# Patient Record
Sex: Female | Born: 1945 | Race: White | Hispanic: No | Marital: Married | Smoking: Never smoker
Health system: Southern US, Community
[De-identification: ages and names within clinical notes are randomized; demographics above are authoritative.]

## PROBLEM LIST (undated history)

## (undated) DIAGNOSIS — R7301 Impaired fasting glucose: Secondary | ICD-10-CM

## (undated) DIAGNOSIS — K589 Irritable bowel syndrome without diarrhea: Secondary | ICD-10-CM

## (undated) DIAGNOSIS — D332 Benign neoplasm of brain, unspecified: Secondary | ICD-10-CM

## (undated) DIAGNOSIS — M1990 Unspecified osteoarthritis, unspecified site: Secondary | ICD-10-CM

## (undated) DIAGNOSIS — K219 Gastro-esophageal reflux disease without esophagitis: Secondary | ICD-10-CM

## (undated) DIAGNOSIS — M8580 Other specified disorders of bone density and structure, unspecified site: Secondary | ICD-10-CM

## (undated) DIAGNOSIS — E039 Hypothyroidism, unspecified: Secondary | ICD-10-CM

## (undated) DIAGNOSIS — Z8551 Personal history of malignant neoplasm of bladder: Secondary | ICD-10-CM

## (undated) DIAGNOSIS — I872 Venous insufficiency (chronic) (peripheral): Secondary | ICD-10-CM

## (undated) DIAGNOSIS — R0789 Other chest pain: Secondary | ICD-10-CM

## (undated) DIAGNOSIS — I10 Essential (primary) hypertension: Secondary | ICD-10-CM

## (undated) DIAGNOSIS — Z8669 Personal history of other diseases of the nervous system and sense organs: Secondary | ICD-10-CM

## (undated) DIAGNOSIS — R7303 Prediabetes: Secondary | ICD-10-CM

## (undated) HISTORY — DX: Irritable bowel syndrome, unspecified: K58.9

## (undated) HISTORY — DX: Gastro-esophageal reflux disease without esophagitis: K21.9

## (undated) HISTORY — DX: Other specified disorders of bone density and structure, unspecified site: M85.80

## (undated) HISTORY — PX: CARPAL TUNNEL RELEASE: SHX101

## (undated) HISTORY — DX: Other chest pain: R07.89

## (undated) HISTORY — PX: ABDOMINAL HYSTERECTOMY: SHX81

## (undated) HISTORY — DX: Impaired fasting glucose: R73.01

## (undated) HISTORY — DX: Essential (primary) hypertension: I10

## (undated) HISTORY — DX: Benign neoplasm of brain, unspecified: D33.2

## (undated) HISTORY — PX: BREAST BIOPSY: SHX20

## (undated) HISTORY — DX: Venous insufficiency (chronic) (peripheral): I87.2

## (undated) HISTORY — PX: LUMBAR DISC SURGERY: SHX700

## (undated) HISTORY — PX: PARTIAL HYSTERECTOMY: SHX80

## (undated) HISTORY — PX: APPENDECTOMY: SHX54

---

## 2000-07-30 DIAGNOSIS — D332 Benign neoplasm of brain, unspecified: Secondary | ICD-10-CM

## 2000-07-30 HISTORY — DX: Benign neoplasm of brain, unspecified: D33.2

## 2000-11-18 ENCOUNTER — Encounter: Payer: Self-pay | Admitting: Family Medicine

## 2000-11-18 ENCOUNTER — Ambulatory Visit (HOSPITAL_COMMUNITY): Admission: RE | Admit: 2000-11-18 | Discharge: 2000-11-18 | Payer: Self-pay | Admitting: Family Medicine

## 2002-01-23 ENCOUNTER — Ambulatory Visit (HOSPITAL_COMMUNITY): Admission: RE | Admit: 2002-01-23 | Discharge: 2002-01-23 | Payer: Self-pay | Admitting: Family Medicine

## 2002-01-23 ENCOUNTER — Encounter: Payer: Self-pay | Admitting: Family Medicine

## 2002-03-13 ENCOUNTER — Ambulatory Visit (HOSPITAL_COMMUNITY): Admission: RE | Admit: 2002-03-13 | Discharge: 2002-03-13 | Payer: Self-pay | Admitting: Internal Medicine

## 2003-03-30 ENCOUNTER — Ambulatory Visit (HOSPITAL_COMMUNITY): Admission: RE | Admit: 2003-03-30 | Discharge: 2003-03-30 | Payer: Self-pay | Admitting: Obstetrics and Gynecology

## 2003-03-30 ENCOUNTER — Encounter: Payer: Self-pay | Admitting: Family Medicine

## 2003-04-06 ENCOUNTER — Ambulatory Visit (HOSPITAL_COMMUNITY): Admission: RE | Admit: 2003-04-06 | Discharge: 2003-04-06 | Payer: Self-pay | Admitting: Family Medicine

## 2003-04-06 ENCOUNTER — Encounter: Payer: Self-pay | Admitting: Family Medicine

## 2003-11-18 ENCOUNTER — Ambulatory Visit (HOSPITAL_COMMUNITY): Admission: RE | Admit: 2003-11-18 | Discharge: 2003-11-18 | Payer: Self-pay | Admitting: Family Medicine

## 2004-03-14 ENCOUNTER — Ambulatory Visit (HOSPITAL_COMMUNITY): Admission: RE | Admit: 2004-03-14 | Discharge: 2004-03-14 | Payer: Self-pay | Admitting: Family Medicine

## 2004-08-05 ENCOUNTER — Ambulatory Visit (HOSPITAL_COMMUNITY): Admission: RE | Admit: 2004-08-05 | Discharge: 2004-08-05 | Payer: Self-pay | Admitting: Family Medicine

## 2004-08-16 ENCOUNTER — Ambulatory Visit (HOSPITAL_COMMUNITY): Admission: RE | Admit: 2004-08-16 | Discharge: 2004-08-17 | Payer: Self-pay | Admitting: Neurosurgery

## 2004-11-10 ENCOUNTER — Ambulatory Visit (HOSPITAL_COMMUNITY): Admission: RE | Admit: 2004-11-10 | Discharge: 2004-11-10 | Payer: Self-pay | Admitting: Neurology

## 2004-12-18 ENCOUNTER — Ambulatory Visit (HOSPITAL_COMMUNITY): Admission: RE | Admit: 2004-12-18 | Discharge: 2004-12-18 | Payer: Self-pay | Admitting: Family Medicine

## 2005-05-08 ENCOUNTER — Ambulatory Visit (HOSPITAL_COMMUNITY): Admission: RE | Admit: 2005-05-08 | Discharge: 2005-05-08 | Payer: Self-pay | Admitting: Family Medicine

## 2005-11-14 ENCOUNTER — Ambulatory Visit (HOSPITAL_COMMUNITY): Admission: RE | Admit: 2005-11-14 | Discharge: 2005-11-14 | Payer: Self-pay | Admitting: Neurosurgery

## 2006-02-26 ENCOUNTER — Ambulatory Visit (HOSPITAL_COMMUNITY): Admission: RE | Admit: 2006-02-26 | Discharge: 2006-02-26 | Payer: Self-pay | Admitting: Family Medicine

## 2006-05-09 ENCOUNTER — Ambulatory Visit (HOSPITAL_COMMUNITY): Admission: RE | Admit: 2006-05-09 | Discharge: 2006-05-09 | Payer: Self-pay | Admitting: Family Medicine

## 2007-05-12 ENCOUNTER — Ambulatory Visit (HOSPITAL_COMMUNITY): Admission: RE | Admit: 2007-05-12 | Discharge: 2007-05-12 | Payer: Self-pay | Admitting: Family Medicine

## 2007-07-09 ENCOUNTER — Ambulatory Visit: Payer: Self-pay | Admitting: Internal Medicine

## 2007-07-21 ENCOUNTER — Ambulatory Visit (HOSPITAL_COMMUNITY): Admission: RE | Admit: 2007-07-21 | Discharge: 2007-07-21 | Payer: Self-pay | Admitting: Internal Medicine

## 2007-07-29 ENCOUNTER — Ambulatory Visit: Payer: Self-pay | Admitting: Internal Medicine

## 2007-07-29 ENCOUNTER — Ambulatory Visit (HOSPITAL_COMMUNITY): Admission: RE | Admit: 2007-07-29 | Discharge: 2007-07-29 | Payer: Self-pay | Admitting: Internal Medicine

## 2007-07-29 ENCOUNTER — Encounter: Payer: Self-pay | Admitting: Internal Medicine

## 2009-05-17 ENCOUNTER — Ambulatory Visit (HOSPITAL_COMMUNITY): Admission: RE | Admit: 2009-05-17 | Discharge: 2009-05-17 | Payer: Self-pay | Admitting: Family Medicine

## 2009-06-20 LAB — HM PAP SMEAR: HM Pap smear: NORMAL

## 2009-06-22 ENCOUNTER — Ambulatory Visit (HOSPITAL_COMMUNITY): Admission: RE | Admit: 2009-06-22 | Discharge: 2009-06-22 | Payer: Self-pay | Admitting: Family Medicine

## 2010-05-22 ENCOUNTER — Ambulatory Visit (HOSPITAL_COMMUNITY): Admission: RE | Admit: 2010-05-22 | Discharge: 2010-05-22 | Payer: Self-pay | Admitting: Family Medicine

## 2010-06-20 ENCOUNTER — Encounter (HOSPITAL_COMMUNITY)
Admission: RE | Admit: 2010-06-20 | Discharge: 2010-07-20 | Payer: Self-pay | Source: Home / Self Care | Attending: Family Medicine | Admitting: Family Medicine

## 2010-12-12 NOTE — Op Note (Signed)
NAMEKHLOIE, Kayla [name]               ACCOUNT NO.:  [id number]   MEDICAL RECORD NO.:  [id number]          PATIENT TYPE:  END   LOCATION:  DAY                           FACILITY:  [facility]   PHYSICIAN:  R. Roetta Sessions, M.D. DATE OF BIRTH:  1945/12/03   DATE OF PROCEDURE:  07/29/2007  DATE OF DISCHARGE:                               OPERATIVE REPORT   DATE OF SURGERY:  07/29/2007.   PROCEDURE PERFORMED:  Diagnostic esophagogastroduodenoscopy followed by  ileal colonoscopy with biopsy.   INDICATIONS FOR PROCEDURE:  The patient is a 65 year old lady with  longstanding gastroesophageal reflux disease symptoms with worsening  symptoms on H2 blocker therapy.  She was started on Prilosec earlier  this month and has realized significant improvement in reflux symptoms  of nausea and vomiting.  She does report vague esophageal dysphagia  related to solids.  In addition, she has a positive family history of  colon cancer and has low volume hematochezia.  An EGD and colonoscopy  are now being done and the procedure has been discussed with the patient  at length.  Potential risks, benefits and alternatives have been  reviewed, questions answered and the patient is agreeable.  Please see  documentation on the medical record.   MONITORING:  O2 saturation, blood pressure and pulse were monitored  throughout the entire procedure.   CONSCIOUS SEDATION:  Verse 4 mg IV, Demerol 75 mg IV in divided doses.   INSTRUMENT USED:  Pentax video chip system.   PROCEDURE/FINDINGS:  Digital rectal exam revealed external hemorrhoids  only. __________ following prep was adequate.  Colonic mucosa was  surveyed from the rectosigmoid junction through the left transverse  right colon to the orifice, ileocecal valve and cecum.  These structures  were well seen and photographed for the record.  Terminal ileum was  intubated to about 10 cm from this level scope was slowly withdrawn and  all previous mentioned mucosal  surfaces were again seen.  The patient  was noted have a 5-mm polyp at the splenic flexure which was cold  biopsied/removed.  The remainder of colonic mucosa appeared entirely  normal, as did terminal ileum mucosa.  Scope was pulled down to the  rectum.  Examination of rectal mucosa including retroflex view anal  verge demonstrated a couple of anal papillae only.  The patient  tolerated the procedure well.  .   IMPRESSION:  1. External  hemorrhoids and anal papillae, otherwise normal rectum.  2. A polyp in the splenic flexure removed as described above.  3. The remainder colonic mucosa and terminal ileum mucosa appeared      normal.   RECOMMENDATIONS:  1. Continue Prilosec 20 mg orally daily for gastroesophageal reflux      disease.  2. Literature on gastroesophageal reflux disease provided to the      patient.  3. Hemorrhoid literature provided to the patient.  4. A 10-day course of Anusol HC suppositories, 1 per rectum at      bedtime.  5. Will follow-up on pathology.  6. She is to let us know if bleeding does not cease with  the above-      mentioned therapy.      [name], M.D.  Electronically Signed     RMR/MEDQ  D:  07/29/2007  T:  07/29/2007  Job:  [id number]   cc:   Lubertha South, Dr.

## 2010-12-12 NOTE — Consult Note (Signed)
[name], [name]               ACCOUNT NO.:  [id number]   MEDICAL RECORD NO.:  [id number]          PATIENT TYPE:  AMB   LOCATION:  DAY                           FACILITY:  [facility]   PHYSICIAN:  R. Roetta Sessions, M.D. DATE OF BIRTH:  15-Nov-1945   DATE OF CONSULTATION:  DATE OF DISCHARGE:                                 CONSULTATION   REFERRING PHYSICIAN:  Donna Bernard, M.D.   REASON FOR CONSULTATION:  Need colonoscopy and epigastric pain, nausea,  and hematochezia.   HISTORY OF PRESENT ILLNESS:  Kayla [name] is a 65 year old female who was  referred for high-risk screening colonoscopy given family history of  colon cancer.  Upon screening, she had complaints of nausea and  abdominal pain and intermittent hematochezia.  She was, therefore,  brought to the office for further evaluation prior to any procedures.  Kayla [name] presents today telling me she has had a 21-month history of  nausea and epigastric pain that is usually postprandial. She describes  the sensation as an knot. It does become stabbing at times, usually  after she eats.  It radiates through to her back. She becomes quite  nauseous. She has tried Zantac which does seem to help a little. She  also complains of daily heartburn and indigestion.  She has been off  proton pump inhibitor for about 10 years now but does have history of  reflux esophagitis previously.  She denies any vomiting.  She has lost a  few pounds due to her anorexia and the fear of pain.  She has noticed  some small volume intermittent hematochezia with wiping on the toilet  paper after bowel movement.  She attributes this to hemorrhoids.  Denies  any diarrhea, constipation.  Denies any fever or chills.  Denies any  dysphagia or odynophagia.  She rates the epigastric pain at about 4/10  on pain scale.  It is intermittent but does occur almost every time she  eats now.   PAST MEDICAL AND SURGICAL HISTORY:  1. Hypertension.  2. She had a history of  IBS diagnosed by Dr. Loreta Ave in 1997.  3. Reflux esophagitis.  4. Tinnitus.  5. She had a colonoscopy on March 13, 2002, by Dr. Karilyn Cota that showed      chronic active colitis, nonspecific colitis of the appendiceal      stump, was otherwise normal.  6. She has history of hysterectomy.  7. Right and left carpal tunnel release.  8. Appendectomy.  9. Brain tumor.   CURRENT MEDICATIONS:  1. Dyazide 37.5/25 mg daily,  2. Zyrtec 10 mg p.r.n.  3. Zantac 150 mg p.r.n.  4. Garlic pills twice weekly.  5. Tylenol p.r.n.  6. Tums p.r.n.   ALLERGIES:  No known drug allergies.   FAMILY HISTORY:  Is positive for father diagnosed with colon cancer at  age 45 and a brother with metastatic colon cancer diagnosed at age 57,  deceased 1 year later.   SOCIAL HISTORY:  Kayla [name] is married.  She has two grown healthy  children.  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 She denies any tobacco or drug  use.  She consumes a couple of glasses of wine per week.   REVIEW OF SYSTEMS:  See HPI, otherwise negative.   PHYSICAL EXAMINATION:  VITAL SIGNS:  Weight 179, height 67 inches.  Temperature 98.2, blood pressure 120/82, pulse 72.  GENERAL:  Kayla [name] is a well-developed, well-nourished Caucasian  female in no acute distress.  HEENT:  Sclerae nonicteric.  Conjunctivae pink.  Oropharynx pink and  moist without lesions.  NECK:  Supple without mass or thyromegaly.  HEART:  Regular rate and rhythm.  Normal S1-S2 without murmurs, clicks,  rubs or gallops.  LUNGS:  Clear to auscultation bilaterally.  ABDOMEN:  Positive bowel sounds x4.  No bruits auscultated.  Soft and  nondistended.  She does have Murphy's point tenderness.  No referred  rebound tenderness or guarding.  No hepatosplenomegaly or mass.  EXTREMITIES:  Without clubbing or edema bilaterally.  SKIN:  Pink, warm, dry without rash or jaundice.   IMPRESSION:  Kayla [name] is a 65 year old  female with the 75-month history  of postprandial epigastric pain which radiates to the back along with  nausea. She also has some daily heartburn and indigestion.  I suspect  the etiology of her pain could be cholecystitis cholelithiasis, but she  also appears to have an element of gastroesophageal reflux disease as  well.   She is going to need colonoscopy to further evaluate her intermittent  hematochezia given strong family history of colon cancer.  She is due  for high-risk screening at this time anyway.   PLAN:  1. Abdominal ultrasound.  2. Begin omeprazole 20 mg daily, #31 with 2 refills.  Have given her      two weeks' worth of Prilosec samples.  3. Colonoscopy with Dr. Jena Gauss in the next 2-3 weeks once ultrasound      results are reviewed. If she does not get relief with proton pump      inhibitor,  would pursue an EGD at the same time  as colonoscopy.   I would like to thank Dr. Lubertha South for allowing [place] to participate in  the care of Kayla [name].      Lorenza Burton, N.P.      [name], M.D.  Electronically Signed    KJ/MEDQ  D:  07/09/2007  T:  07/09/2007  Job:  [id number]   cc:   Donna Bernard, M.D.  Fax: [phone]

## 2010-12-15 NOTE — Op Note (Signed)
NAMEADAIR, LEMAR               ACCOUNT NO.:  [id number]   MEDICAL RECORD NO.:  [id number]          PATIENT TYPE:  OIB   LOCATION:  2899                         FACILITY:  [facility]   PHYSICIAN:  Hilda Lias, M.D.   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   PROCEDURE:  Anterior C5-6, C6-7 decompression of the spinal cord,  diskectomy, bilateral foraminotomy, interbody fusion with allograft, plate  from C5 to C7, microscope.   SURGEON:  Hilda Lias, M.D.   ASSISTANT:  Payton Doughty, M.D.   CLINICAL HISTORY:  The patient was admitted because of neck and right upper  extremity pain.  The patient had failed with conservative treatment.  X-rays  showed severe spondylosis at C5-6 and C6-7 central and to the right side.  The surgery was advised, and the risks were explained in the history and  physical.   PROCEDURE:  The patient was taken to the OR and after intubation, the neck  was prepped with Betadine.  A transverse incision through the skin and  platysma down to the cervical spine was done.  X-rays showed that we were at  the level of C4-5.  From then on we identified easily C5-6, C6-7.  With the  microscope we opened the anterior ligament of C5-6 and C6-7 and we did a  total diskectomy.  Indeed, the patient had quite a bit of severe spondylosis  bilaterally, mostly and worst to the right side.  The posterior ligament was  opened and decompression of the spinal cord as well as foraminotomy was  achieved.  It was worse at the right side, and C6-7 was worse than the level  of C5-6.  The nerve was swollen and reddish.  At the end we have a  decompression of the spinal cord and both C6 and C7 nerve  roots.  Having  done this, the area was irrigated.  Then two pieces of allograft, one of 6  mm was inserted at the level of C5-6, and the other one of 7 mm was inserted  between C6-7.  Then a plate using five screws was done.  Lateral C-spine  showed good position of the bone grafts and the plate.  From then on the  area was irrigated.  Hemostasis was done with bipolar.  We waited 10 minutes  to be sure that there was not any bleeding.  From then on the area was  closed with Vicryl and a Steri-Strip.       EB/MEDQ  D:  08/16/2004  T:  08/16/2004  Job:  [id number]

## 2010-12-15 NOTE — H&P (Signed)
[name], [name]               ACCOUNT NO.:  [id number]   MEDICAL RECORD NO.:  [id number]          PATIENT TYPE:  OIB   LOCATION:  NA                           FACILITY:  [facility]   PHYSICIAN:  Hilda Lias, M.D.   DATE OF BIRTH:  [birth year]   DATE OF ADMISSION:  08/16/2004  DATE OF DISCHARGE:                                HISTORY & PHYSICAL   Ms. Lauf is a lady who was seen by me recently in November 2005  complaining of neck pain with radiation to the upper extremities for many  years.  The patient is getting worse.  She had been unable to sleep.  We did  conservative treatment, and she did not improve.  Because of continuation of  the pain, she wanted to proceed with surgery.  She denies any problem with  the lower back and the lower extremities.   PAST MEDICAL HISTORY:  Appendectomy, partial hysterectomy, surgery with a  Gamma knife back in 2002.   SOCIAL HISTORY:  She drink socially, but she does not smoke.   REVIEW OF SYSTEMS:  Positive for asthma, neck pain, difficulty with pain at  night time.   PHYSICAL EXAMINATION:  HEENT:  Head, ears, nose and throat normal.  NECK:  She is able to flex, but extension and lateralization produce  discomfort onto the shoulder.  CHEST:  Lungs clear.  CARDIAC:  Heart sounds normal.  ABDOMEN:  Normal.  EXTREMITIES:  Normal pulses.  NEUROLOGIC:  Mental status normal.  Cranial nerves normal.  Strength:  I can  break easily both biceps, both wrist extensors and the right triceps.  The  left triceps is normal.  Reflexes symmetrical.   Chest x-ray normal.  The x-ray, MRI  showed that she has a step-off at the  level of C5-6 with a stenosis and foraminal compromise as well as cervical  stenosis, mostly going to the right side.   CLINICAL IMPRESSION:  Cervical spondylosis, C5-6, C6-7.   RECOMMENDATIONS:  The patient is going to proceed with surgery.  The  procedure will be anterior cervical diskectomy at the level of C5-6, C6-7,  with  bone graft and plate.  She knows about the risks, such as infection,  CSF leak, worsening pain, paralysis, damage to the vocal cords, stroke,  damage to the vertebral artery.  Also she is well aware of the possibility  of failure of the bone graft, which might require surgery.       EB/MEDQ  D:  08/16/2004  T:  08/16/2004  Job:  [id number]

## 2010-12-15 NOTE — Op Note (Signed)
Kayla [name], Kayla [name]               ACCOUNT NO.:  [id number]   MEDICAL RECORD NO.:  [id number]          PATIENT TYPE:  AMB   LOCATION:  DAY                           FACILITY:  [facility]   PHYSICIAN:  R. Roetta Sessions, M.D. DATE OF BIRTH:  [birth year]   DATE OF PROCEDURE:  07/28/2008  DATE OF DISCHARGE:  07/29/2007                               OPERATIVE REPORT   PROCEDURE:  Diagnostic esophagogastroduodenoscopy.   INDICATIONS FOR PROCEDURE:  A 61-year lady with longstanding  gastroesophageal reflux disease and vague intermittent esophageal  dysphagia.  This approach has been discussed with the patient at length.  Risks, benefits and alternatives have been viewed.  The procedure done  well was done at the time of colonoscopy (see separate dictation).   PROCEDURE NOTE:  Oxygen saturation, blood pressure, pulse, and  respirations were monitored throughout the entire procedure.  Conscious  sedation consisted of a total of 4 mg of Versed and 75 mg Demerol in  divided doses for both colonoscopy and EGD.  Instrumentation was Pentax  video chip system.  Cetacaine spray for topical pharyngeal anesthesia.   FINDINGS:  Examination of the tubular esophagus revealed no mucosal  abnormalities,  junction easily traversed.  Stomach:  Gastric cavity was emptied and insufflated well with air.  The  gastric mucosa including retroflexion of the proximal stomach  and  esophagogastric junction demonstrated only a small hiatal hernia.  Pylorus patent, easily traversed.  Examination of the bulb and second  portion revealed no abnormalities.   THERAPY AND DIAGNOSTIC MANEUVERS:  None.   The patient tolerated the procedure well and was reactive after  endoscopy.   IMPRESSION:  1. Normal esophagus.  2. Small hiatal hernia.  3. Otherwise normal stomach D1 and D2.   RECOMMENDATIONS:  See colonoscopy report.      [name], M.D.  Electronically Signed     RMR/MEDQ  D:  08/31/2007  T:   09/01/2007  Job:  [id number]   cc:   Lorin Picket A. Gerda Diss, MD  Fax: [phone]

## 2011-04-23 ENCOUNTER — Other Ambulatory Visit: Payer: Self-pay | Admitting: Family Medicine

## 2011-04-23 DIAGNOSIS — Z139 Encounter for screening, unspecified: Secondary | ICD-10-CM

## 2011-05-04 ENCOUNTER — Ambulatory Visit (HOSPITAL_COMMUNITY)
Admission: RE | Admit: 2011-05-04 | Discharge: 2011-05-04 | Disposition: A | Discharge status: Home / Self Care | Payer: Medicare HMO | Source: Ambulatory Visit | Attending: Family Medicine | Admitting: Family Medicine

## 2011-05-04 ENCOUNTER — Ambulatory Visit (HOSPITAL_COMMUNITY): Payer: Self-pay

## 2011-05-04 DIAGNOSIS — Z1231 Encounter for screening mammogram for malignant neoplasm of breast: Secondary | ICD-10-CM | POA: Insufficient documentation

## 2011-05-04 DIAGNOSIS — Z139 Encounter for screening, unspecified: Secondary | ICD-10-CM

## 2011-05-04 LAB — HM MAMMOGRAPHY

## 2011-07-30 ENCOUNTER — Other Ambulatory Visit: Payer: Self-pay | Admitting: Family Medicine

## 2011-07-30 DIAGNOSIS — M8580 Other specified disorders of bone density and structure, unspecified site: Secondary | ICD-10-CM

## 2011-08-01 ENCOUNTER — Ambulatory Visit: Payer: Medicare Other | Admitting: Cardiology

## 2011-08-02 ENCOUNTER — Encounter: Payer: Self-pay | Admitting: Cardiology

## 2011-08-02 ENCOUNTER — Ambulatory Visit (INDEPENDENT_AMBULATORY_CARE_PROVIDER_SITE_OTHER): Payer: Medicare HMO | Admitting: Cardiology

## 2011-08-02 ENCOUNTER — Encounter: Payer: Self-pay | Admitting: *Deleted

## 2011-08-02 DIAGNOSIS — R0789 Other chest pain: Secondary | ICD-10-CM

## 2011-08-02 DIAGNOSIS — J45909 Unspecified asthma, uncomplicated: Secondary | ICD-10-CM

## 2011-08-02 DIAGNOSIS — R0602 Shortness of breath: Secondary | ICD-10-CM

## 2011-08-02 DIAGNOSIS — D332 Benign neoplasm of brain, unspecified: Secondary | ICD-10-CM

## 2011-08-02 DIAGNOSIS — I10 Essential (primary) hypertension: Secondary | ICD-10-CM

## 2011-08-02 DIAGNOSIS — R079 Chest pain, unspecified: Secondary | ICD-10-CM

## 2011-08-02 DIAGNOSIS — K589 Irritable bowel syndrome without diarrhea: Secondary | ICD-10-CM

## 2011-08-02 MED ORDER — LISINOPRIL 20 MG PO TABS: 20.0000 mg | ORAL_TABLET | Freq: Every day | ORAL | Status: DC

## 2011-08-02 NOTE — Patient Instructions (Signed)
Your physician recommends that you schedule a follow-up appointment in: 2 weeks  Your physician has recommended you make the following change in your medication:  1 - STOP Verapamil 2 - START Lisinopril 20 mg daily  Your physician has requested that you regularly monitor and record your blood pressure readings at home. Please use the same machine at the same time of day to check your readings and record them to bring to your follow-up visit.

## 2011-08-02 NOTE — Progress Notes (Signed)
Patient ID: Kayla [name], female   DOB: 1946-02-29, 66 y.o.   MRN: [id number] HPI: Initial office visit kindly requested by Dr. Lilyan Punt for evaluation of chest discomfort.  Kayla [name] has enjoyed generally good health without any history of cardiac problems, prior cardiology visits or significant past cardiac testing.  She has recently been found to have hypertension for which she has been treated with Verapamil SR and diuretic.  The former was discontinued due to severe constipation.  She was recently evaluated in the emergency department for a few day history of chest tightness.  This was associated with some restriction of breathing.  Patient has a history of asthma, and the symptoms improve when she uses a beta agonist inhaler.  She has had long-standing obesity without any significant change during the past year or two.  She has joined [employer], but has not yet started the program.  Current Outpatient Prescriptions  Medication Sig Dispense Refill  . albuterol (PROVENTIL HFA;VENTOLIN HFA) 108 (90 BASE) MCG/ACT inhaler Inhale 2 puffs into the lungs as needed.        . loratadine (ALLERGY) 10 MG tablet Take 10 mg by mouth as needed.        . temazepam (RESTORIL) 15 MG capsule Take 15 mg by mouth at bedtime as needed.        Marland Kitchen lisinopril (PRINIVIL,ZESTRIL) 20 MG tablet Take 1 tablet (20 mg total) by mouth daily.  90 tablet  3  . triamterene-hydrochlorothiazide (DYAZIDE) 37.5-25 MG per capsule Take 1 capsule by mouth every morning.         No Known Allergies    Past Medical History  Diagnosis Date  . Asthma   . Hypertension   . Irritable bowel syndrome   . Tinnitus   . Benign brain tumor 2002    Treated at [facility] with gamma knife     Past Surgical History  Procedure Date  . Lumbar disc surgery   . Carpal tunnel release     Bilateral  . Partial hysterectomy      Family History  Problem Relation Age of Onset  . Cancer Father     colon cancer; also brother-colon And  sister-breast  . Heart disease Mother     at advanced age     History   Social History  . Marital Status: Married    Spouse Name: N/A    Number of Children: 2  . Years of Education: N/A   Occupational History  . Small business owner     Laundry, Adult nurse   Social History Main Topics  . Smoking status: Never Smoker   . Smokeless tobacco: Never Used  . Alcohol Use: 0.5 oz/week    1 drink(s) per week  . Drug Use: Not on file  . Sexually Active: Not on file   Other Topics Concern  . Not on file   Social History Narrative   Exercises regularly     ROS: Requires corrective lenses.  All other systems reviewed and are negative.  PHYSICAL EXAM: BP 157/102  Pulse 78  Ht 5' 6.5" (1.689 m)  Wt 82.101 kg (181 lb)  BMI 28.78 kg/m2  General-Well-developed; no acute distress Body Habitus-markedly obese HEENT-[place]/AT; PERRL; EOM intact; conjunctiva and lids nl Neck-No JVD; no carotid bruits Endocrine-No thyromegaly Lungs-Clear lung fields; resonant percussion; mildly prolonged expiratory phase. Cardiovascular- normal PMI; normal S1 and somewhat prominently split S2 Abdomen-BS normal; soft and non-tender without masses or organomegaly Musculoskeletal-No deformities, cyanosis or clubbing Neurologic-Nl  cranial nerves; symmetric strength and tone Skin- Warm, no significant lesions Extremities-Nl distal pulses; no edema  EKG:  Normal sinus rhythm, borderline low voltage, right axis deviation, nonspecific ST segment abnormality.  No previous tracing for comparison.  ASSESSMENT AND PLAN:  Glenwood Bing, MD 08/02/2011 8:41 PM

## 2011-08-05 ENCOUNTER — Encounter: Payer: Self-pay | Admitting: Cardiology

## 2011-08-05 DIAGNOSIS — J45909 Unspecified asthma, uncomplicated: Secondary | ICD-10-CM | POA: Insufficient documentation

## 2011-08-05 DIAGNOSIS — D332 Benign neoplasm of brain, unspecified: Secondary | ICD-10-CM | POA: Insufficient documentation

## 2011-08-05 DIAGNOSIS — I10 Essential (primary) hypertension: Secondary | ICD-10-CM | POA: Insufficient documentation

## 2011-08-05 DIAGNOSIS — R0789 Other chest pain: Secondary | ICD-10-CM | POA: Insufficient documentation

## 2011-08-05 DIAGNOSIS — K589 Irritable bowel syndrome without diarrhea: Secondary | ICD-10-CM | POA: Insufficient documentation

## 2011-08-05 NOTE — Assessment & Plan Note (Signed)
Plan blood pressure control is inadequate.  ACE Inhibitor will be added to her medical regime.  She is encouraged to measure blood pressure at home or in local pharmacies and will return in 2 weeks for reassessment.

## 2011-08-05 NOTE — Assessment & Plan Note (Signed)
Patient appears to have significant asthma, and exacerbations could account for chest discomfort.  She will continue to use the albuterol on an as-needed basis, but may require consistent control medication, most likely inhaled steroids.  PFTs will be obtained to quantify pulmonary function and to verify that there is a bronchodilator response to beta agonist therapy.

## 2011-08-05 NOTE — Assessment & Plan Note (Addendum)
Chest discomfort is atypical, and risk of coronary disease is relatively low.  We will proceed with a stress echocardiogram.

## 2011-08-07 ENCOUNTER — Ambulatory Visit (HOSPITAL_COMMUNITY)
Admission: RE | Admit: 2011-08-07 | Discharge: 2011-08-07 | Disposition: A | Discharge status: Home / Self Care | Payer: Medicare HMO | Source: Ambulatory Visit | Attending: Cardiology | Admitting: Cardiology

## 2011-08-07 ENCOUNTER — Ambulatory Visit (HOSPITAL_COMMUNITY)
Admission: RE | Admit: 2011-08-07 | Discharge: 2011-08-07 | Disposition: A | Discharge status: Home / Self Care | Payer: Medicare HMO | Source: Ambulatory Visit | Attending: Family Medicine | Admitting: Family Medicine

## 2011-08-07 DIAGNOSIS — R079 Chest pain, unspecified: Secondary | ICD-10-CM | POA: Insufficient documentation

## 2011-08-07 DIAGNOSIS — M899 Disorder of bone, unspecified: Secondary | ICD-10-CM | POA: Insufficient documentation

## 2011-08-07 DIAGNOSIS — R0609 Other forms of dyspnea: Secondary | ICD-10-CM | POA: Insufficient documentation

## 2011-08-07 DIAGNOSIS — R0602 Shortness of breath: Secondary | ICD-10-CM

## 2011-08-07 DIAGNOSIS — R0989 Other specified symptoms and signs involving the circulatory and respiratory systems: Secondary | ICD-10-CM | POA: Insufficient documentation

## 2011-08-07 DIAGNOSIS — M8580 Other specified disorders of bone density and structure, unspecified site: Secondary | ICD-10-CM

## 2011-08-07 LAB — BLOOD GAS, ARTERIAL
Bicarbonate: 24.3 mEq/L — ABNORMAL HIGH (ref 20.0–24.0)
Patient temperature: 37
TCO2: 21 mmol/L (ref 0–100)
pCO2 arterial: 40.6 mmHg (ref 35.0–45.0)
pH, Arterial: 7.395 (ref 7.350–7.400)
pO2, Arterial: 80.7 mmHg (ref 80.0–100.0)

## 2011-08-07 LAB — HM DEXA SCAN

## 2011-08-07 MED ORDER — ALBUTEROL SULFATE (5 MG/ML) 0.5% IN NEBU
2.5000 mg | INHALATION_SOLUTION | Freq: Once | RESPIRATORY_TRACT | Status: AC
  Administered 2011-08-07: 2.5 mg via RESPIRATORY_TRACT

## 2011-08-08 NOTE — Procedures (Signed)
[name], Kayla [name]               ACCOUNT NO.:  [id number]  MEDICAL RECORD NO.:  [id number]  LOCATION:                                 FACILITY:  PHYSICIAN:  Eadie Repetto L. Juanetta Gosling, M.D.DATE OF BIRTH:  08/09/1945  DATE OF PROCEDURE: DATE OF DISCHARGE:                           PULMONARY FUNCTION TEST   Reason for pulmonary function testing is chest pains and shortness of breath. 1. Spirometry shows a mild ventilatory defect with evidence of airflow     obstruction. 2. Lung volumes are normal. 3. DLCO is normal. 4. Arterial blood gas is normal. 5. There is no significant bronchodilator improvement. 6. 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     Kayla [name] L. Juanetta Gosling, M.D.     ELH/MEDQ  D:  08/07/2011  T:  08/07/2011  Job:  [id number]  cc:   Gerrit Friends. Dietrich Pates, MD, [facility] [address] [place], [place] [address]

## 2011-08-14 ENCOUNTER — Ambulatory Visit (HOSPITAL_COMMUNITY)
Admission: RE | Admit: 2011-08-14 | Discharge: 2011-08-14 | Disposition: A | Discharge status: Home / Self Care | Payer: Medicare HMO | Source: Ambulatory Visit | Attending: Cardiology | Admitting: Cardiology

## 2011-08-14 ENCOUNTER — Ambulatory Visit (HOSPITAL_COMMUNITY)
Admission: RE | Admit: 2011-08-14 | Discharge: 2011-08-14 | Disposition: A | Discharge status: Home / Self Care | Payer: Medicare HMO | Source: Ambulatory Visit | Attending: Family Medicine | Admitting: Family Medicine

## 2011-08-14 ENCOUNTER — Encounter (HOSPITAL_COMMUNITY): Payer: Self-pay | Admitting: Cardiology

## 2011-08-14 DIAGNOSIS — R0609 Other forms of dyspnea: Secondary | ICD-10-CM | POA: Insufficient documentation

## 2011-08-14 DIAGNOSIS — R0989 Other specified symptoms and signs involving the circulatory and respiratory systems: Secondary | ICD-10-CM | POA: Insufficient documentation

## 2011-08-14 DIAGNOSIS — R0602 Shortness of breath: Secondary | ICD-10-CM

## 2011-08-14 DIAGNOSIS — R079 Chest pain, unspecified: Secondary | ICD-10-CM | POA: Insufficient documentation

## 2011-08-14 DIAGNOSIS — R072 Precordial pain: Secondary | ICD-10-CM

## 2011-08-14 DIAGNOSIS — I10 Essential (primary) hypertension: Secondary | ICD-10-CM | POA: Insufficient documentation

## 2011-08-14 DIAGNOSIS — R0789 Other chest pain: Secondary | ICD-10-CM

## 2011-08-14 NOTE — Progress Notes (Signed)
*  PRELIMINARY RESULTS* Echocardiogram Echocardiogram Stress Test has been performed.  Conrad Evart 08/14/2011, 10:48 AM

## 2011-08-14 NOTE — Progress Notes (Signed)
Stress Lab Nurses Notes - [facility]  EARNESTEEN BIRNIE 08/14/2011  Reason for doing test: Chest Pain and Dyspnea Type of test: Stress Echo Nurse performing test: Parke Poisson, RN Nuclear Medicine Tech: Not Applicable Echo Tech: Karrie Doffing MD performing test: R. Rothbart Family MD:  Lubertha South Test explained and consent signed: yes IV started: No IV started Symptoms: Fatigue and SOB Treatment/Intervention: None Reason test stopped: fatigue and reached target HR After recovery IV was: NA Patient discharged: Home Patient's Condition upon discharge was: stable Comments: During test BP 178/78 & HR 149.  Recovery BP 118/72 & HR 80.  Symptoms resolved in recovery. Erskine Speed T

## 2011-08-17 ENCOUNTER — Encounter: Payer: Self-pay | Admitting: Cardiology

## 2011-08-17 ENCOUNTER — Ambulatory Visit (INDEPENDENT_AMBULATORY_CARE_PROVIDER_SITE_OTHER): Payer: Medicare HMO | Admitting: Cardiology

## 2011-08-17 DIAGNOSIS — I10 Essential (primary) hypertension: Secondary | ICD-10-CM

## 2011-08-17 DIAGNOSIS — R0789 Other chest pain: Secondary | ICD-10-CM

## 2011-08-17 MED ORDER — LISINOPRIL 40 MG PO TABS: 40.0000 mg | ORAL_TABLET | Freq: Every day | ORAL | Status: DC

## 2011-08-17 NOTE — Progress Notes (Signed)
Patient ID: Kayla [name], female   DOB: Dec 08, 1945, 66 y.o.   MRN: [id number] HPI: Scheduled return visit for this very nice woman under evaluation for chest discomfort.  Since her last visit, she has done quite well.  A stress echocardiogram was entirely negative.  She is concentrating on improving her diet, but is disappointed that she has actually gained weight.  She is planning on an exercise regime, but has not yet started that.  She has had no recurrent chest discomfort, and dyspnea is improved.  Prior to Admission medications   Medication Sig Start Date End Date Taking? Authorizing Provider  albuterol (PROVENTIL HFA;VENTOLIN HFA) 108 (90 BASE) MCG/ACT inhaler Inhale 2 puffs into the lungs as needed.     Yes Historical Provider, MD  loratadine (ALLERGY) 10 MG tablet Take 10 mg by mouth as needed.     Yes Historical Provider, MD  temazepam (RESTORIL) 15 MG capsule Take 15 mg by mouth at bedtime as needed.     Yes Historical Provider, MD  triamterene-hydrochlorothiazide (DYAZIDE) 37.5-25 MG per capsule Take 1 capsule by mouth every morning.     Yes Historical Provider, MD  lisinopril (PRINIVIL,ZESTRIL) 40 MG tablet Take 1 tablet (40 mg total) by mouth daily. 08/17/11 08/16/12  Gerrit Friends. Dietrich Pates, MD  No Known Allergies    Past medical history, social history, and family history reviewed and updated.  ROS: See history of present illness.  PHYSICAL EXAM: BP 143/94  Pulse 75  Ht 5\' 6"  (1.676 m)  Wt 85.73 kg (189 lb)  BMI 30.51 kg/m2  General-Well developed; no acute distress Body habitus-moderately overweight Neck-No JVD, no carotid bruits Lungs: clear lung fields; slightly prolonged I:E ratio Cardiovascular-normal PMI; normal S1 and S2; modest systolic ejection murmur Abdomen-normal bowel sounds; soft and non-tender without masses or organomegaly Skin-Warm, no significant lesions Extremities-Nl distal pulses; no edema   ASSESSMENT AND PLAN:  [name], MD 08/17/2011 4:55  PM

## 2011-08-17 NOTE — Patient Instructions (Signed)
Your physician recommends that you schedule a follow-up appointment in: As needed  Your physician has recommended you make the following change in your medication: Increase lisinopril to 40 mg daily

## 2011-08-17 NOTE — Assessment & Plan Note (Addendum)
Symptoms appear to be resolving and were likely related to a recent upper respiratory infection.  She is congratulated on making changes in her diet, encouraged that these will eventually result in weight loss, provided additional information regarding caloric restriction and encouraged to start her exercise regime, which will help with both her symptoms and weight reduction.  Mild abnormalities were reported on patient's PFTs.  These may be related to a recent upper respiratory infection and/or obesity.  They're not likely reflected in her symptomatology and do not require additional evaluation or treatment.

## 2011-08-17 NOTE — Assessment & Plan Note (Signed)
She has recorded approximately 20 blood pressure readings at home with generally good results.  Approximately 15% show systolics between 140 and 150.  She will increase lisinopril to 40 mg per day, but blood pressure will likely remain in the normal range if she is successful at achieving a 20 pound weight loss.  She will continue to monitor this at home and with Dr. Gerda Diss.

## 2012-06-24 ENCOUNTER — Encounter: Payer: Self-pay | Admitting: *Deleted

## 2012-08-09 ENCOUNTER — Other Ambulatory Visit: Payer: Self-pay | Admitting: Cardiology

## 2013-04-28 ENCOUNTER — Other Ambulatory Visit: Payer: Self-pay | Admitting: Family Medicine

## 2013-04-28 DIAGNOSIS — Z139 Encounter for screening, unspecified: Secondary | ICD-10-CM

## 2013-05-01 ENCOUNTER — Ambulatory Visit (INDEPENDENT_AMBULATORY_CARE_PROVIDER_SITE_OTHER): Payer: Medicare HMO | Admitting: Family Medicine

## 2013-05-01 ENCOUNTER — Encounter: Payer: Self-pay | Admitting: Family Medicine

## 2013-05-01 ENCOUNTER — Ambulatory Visit (HOSPITAL_COMMUNITY)
Admission: RE | Admit: 2013-05-01 | Discharge: 2013-05-01 | Disposition: A | Discharge status: Home / Self Care | Payer: Medicare HMO | Source: Ambulatory Visit | Attending: Family Medicine | Admitting: Family Medicine

## 2013-05-01 VITALS — BP 142/88 | Temp 98.2°F | Ht 67.0 in | Wt 192.0 lb

## 2013-05-01 DIAGNOSIS — Z23 Encounter for immunization: Secondary | ICD-10-CM

## 2013-05-01 DIAGNOSIS — M25569 Pain in unspecified knee: Secondary | ICD-10-CM

## 2013-05-01 DIAGNOSIS — M25561 Pain in right knee: Secondary | ICD-10-CM

## 2013-05-01 DIAGNOSIS — I10 Essential (primary) hypertension: Secondary | ICD-10-CM

## 2013-05-01 DIAGNOSIS — J45909 Unspecified asthma, uncomplicated: Secondary | ICD-10-CM

## 2013-05-01 MED ORDER — LISINOPRIL 20 MG PO TABS: 20.0000 mg | ORAL_TABLET | Freq: Every day | ORAL | Status: DC

## 2013-05-01 MED ORDER — HYDROCHLOROTHIAZIDE 25 MG PO TABS: 25.0000 mg | ORAL_TABLET | Freq: Every day | ORAL | Status: DC

## 2013-05-01 NOTE — Progress Notes (Signed)
  Subjective:    Patient ID: Kayla [name], female    DOB: Dec 02, 1945, 67 y.o.   MRN: [id number]  HPIRight leg pain for 3 weeks.  Has a knot on right lower leg. Swelling at night.   Patient has been experiencing right hip pain. Radiating down into the right leg. Feels like it is her sciatica. Saw a chiropractor is had multiple treatments. Also notes swelling right anterior calf. Tender in nature. Worse with certain motions. Worried about a clot.  Claims compliance with her blood pressure medication. At least from a lisinopril standpoint, however does not always take the hydrochlorothiazide.  Reports a lot of increased stress with sibling in the hospital.      Review of Systems No chest pain no shortness breath no abdominal pain ROS otherwise negative    Objective:   Physical Exam  Alert HEENT normal. Lungs clear. Heart regular rate and rhythm. Ankles without edema. Right anterior leg tender to palpation. Right knee some crepitations. Negative straight leg raise. Blood pressure repeat 142/90 no sciatic notch tenderness no spine tenderness. Hands some Heberden's nodes      Assessment & Plan:  Impression probable arthritis knee discussed. #2 muscular pain anterior calf doubt clot but patient worried about it discuss. #3 hypertension good control. #4 asthma good control per patient. Plan flu shot. Right knee x-ray. Right leg ultrasound. Refill blood pressure medications. Add Voltaren when necessary for leg pain. WSL

## 2013-05-04 ENCOUNTER — Telehealth: Payer: Self-pay | Admitting: Family Medicine

## 2013-05-04 MED ORDER — DICLOFENAC SODIUM 75 MG PO TBEC: 75.0000 mg | DELAYED_RELEASE_TABLET | Freq: Two times a day (BID) | ORAL | Status: DC | PRN

## 2013-05-04 NOTE — Telephone Encounter (Signed)
RX sent into pharmacy. Left message on voicemail notifying patient.

## 2013-05-04 NOTE — Telephone Encounter (Signed)
Patient states Dr. Brett Canales was going to phone something in for the pain in her leg and hip on 05/01/2013 at her visit.  However, she checked the pharmacy and nothing has been sent over.  States the pain is more in her Hip today and is extremely painful.  [facility] [place]  Patient states if she does not answer please leave a message.

## 2013-05-04 NOTE — Telephone Encounter (Signed)
voltaren 75 one bid prn pain 60 two ref

## 2013-05-05 ENCOUNTER — Ambulatory Visit (HOSPITAL_COMMUNITY)
Admission: RE | Admit: 2013-05-05 | Discharge: 2013-05-05 | Disposition: A | Discharge status: Home / Self Care | Payer: Medicare HMO | Source: Ambulatory Visit | Attending: Family Medicine | Admitting: Family Medicine

## 2013-05-05 DIAGNOSIS — Z139 Encounter for screening, unspecified: Secondary | ICD-10-CM

## 2013-05-05 DIAGNOSIS — Z1231 Encounter for screening mammogram for malignant neoplasm of breast: Secondary | ICD-10-CM | POA: Insufficient documentation

## 2013-05-07 ENCOUNTER — Other Ambulatory Visit: Payer: Self-pay | Admitting: Family Medicine

## 2013-05-07 ENCOUNTER — Other Ambulatory Visit: Payer: Self-pay | Admitting: *Deleted

## 2013-05-07 DIAGNOSIS — R928 Other abnormal and inconclusive findings on diagnostic imaging of breast: Secondary | ICD-10-CM

## 2013-05-21 ENCOUNTER — Ambulatory Visit
Admission: RE | Admit: 2013-05-21 | Discharge: 2013-05-21 | Disposition: A | Discharge status: Home / Self Care | Payer: Medicare HMO | Source: Ambulatory Visit | Attending: Family Medicine | Admitting: Family Medicine

## 2013-05-21 DIAGNOSIS — R928 Other abnormal and inconclusive findings on diagnostic imaging of breast: Secondary | ICD-10-CM

## 2013-07-19 ENCOUNTER — Encounter: Payer: Self-pay | Admitting: *Deleted

## 2013-07-20 ENCOUNTER — Ambulatory Visit (INDEPENDENT_AMBULATORY_CARE_PROVIDER_SITE_OTHER): Payer: Medicare HMO | Admitting: Nurse Practitioner

## 2013-07-20 ENCOUNTER — Encounter: Payer: Self-pay | Admitting: Nurse Practitioner

## 2013-07-20 VITALS — BP 132/96 | Ht 66.5 in | Wt 187.4 lb

## 2013-07-20 DIAGNOSIS — Z0000 Encounter for general adult medical examination without abnormal findings: Secondary | ICD-10-CM

## 2013-07-20 DIAGNOSIS — Z01419 Encounter for gynecological examination (general) (routine) without abnormal findings: Secondary | ICD-10-CM

## 2013-07-20 DIAGNOSIS — I10 Essential (primary) hypertension: Secondary | ICD-10-CM

## 2013-07-20 DIAGNOSIS — R7301 Impaired fasting glucose: Secondary | ICD-10-CM

## 2013-07-20 LAB — LIPID PANEL
Cholesterol: 195 mg/dL (ref 0–200)
Total CHOL/HDL Ratio: 4.3 Ratio
Triglycerides: 215 mg/dL — ABNORMAL HIGH (ref <150)
VLDL: 43 mg/dL — ABNORMAL HIGH (ref 0–40)

## 2013-07-20 LAB — HEPATIC FUNCTION PANEL
Albumin: 4.6 g/dL (ref 3.5–5.2)
Bilirubin, Direct: 0.1 mg/dL (ref 0.0–0.3)
Total Bilirubin: 0.7 mg/dL (ref 0.3–1.2)

## 2013-07-20 LAB — BASIC METABOLIC PANEL
Calcium: 9.4 mg/dL (ref 8.4–10.5)
Creat: 0.85 mg/dL (ref 0.50–1.10)
Sodium: 138 mEq/L (ref 135–145)

## 2013-07-20 LAB — HEMOGLOBIN A1C
Hgb A1c MFr Bld: 5.8 % — ABNORMAL HIGH (ref <5.7)
Mean Plasma Glucose: 120 mg/dL — ABNORMAL HIGH (ref <117)

## 2013-07-20 MED ORDER — TEMAZEPAM 30 MG PO CAPS: 30.0000 mg | ORAL_CAPSULE | Freq: Every evening | ORAL | Status: DC | PRN

## 2013-07-20 MED ORDER — DICLOFENAC SODIUM 75 MG PO TBEC: 75.0000 mg | DELAYED_RELEASE_TABLET | Freq: Two times a day (BID) | ORAL | Status: DC | PRN

## 2013-07-20 MED ORDER — ALBUTEROL SULFATE HFA 108 (90 BASE) MCG/ACT IN AERS: 2.0000 | INHALATION_SPRAY | RESPIRATORY_TRACT | Status: DC | PRN

## 2013-07-27 ENCOUNTER — Encounter: Payer: Self-pay | Admitting: Nurse Practitioner

## 2013-07-27 DIAGNOSIS — M8580 Other specified disorders of bone density and structure, unspecified site: Secondary | ICD-10-CM | POA: Insufficient documentation

## 2013-07-27 NOTE — Progress Notes (Signed)
   Subjective:    Patient ID: Kayla [name], female    DOB: 1945-09-08, 67 y.o.   MRN: [id number]  HPI  presents for her wellness exam. Has had a hysterectomy and bilateral oophorectomy. Married, same sexual partner. Taking vitamin D supplement daily. Regular eye exam. Is due for a dental exam. Has seen Dr. Orvan Falconer for her skin problems.    Review of Systems  Constitutional: Negative for fever, activity change, appetite change and fatigue.  HENT: Negative for congestion, dental problem, ear pain, postnasal drip, rhinorrhea, sinus pressure and sore throat.   Respiratory: Negative for cough, chest tightness, shortness of breath and wheezing.   Cardiovascular: Negative for chest pain and leg swelling.  Gastrointestinal: Negative for nausea, vomiting, abdominal pain, diarrhea, constipation, blood in stool and abdominal distention.  Genitourinary: Negative for dysuria, urgency, frequency, vaginal discharge, difficulty urinating and pelvic pain.  Musculoskeletal: Positive for back pain.       Objective:   Physical Exam  Vitals reviewed. Constitutional: She is oriented to person, place, and time. She appears well-developed. No distress.  HENT:  Right Ear: External ear normal.  Left Ear: External ear normal.  Mouth/Throat: Oropharynx is clear and moist.  Neck: Normal range of motion. Neck supple. No tracheal deviation present. No thyromegaly present.  Cardiovascular: Normal rate, regular rhythm and normal heart sounds.  Exam reveals no gallop.   No murmur heard. Pulmonary/Chest: Effort normal and breath sounds normal.  Abdominal: Soft. She exhibits no distension. There is no tenderness.  Genitourinary: No vaginal discharge found.  Musculoskeletal: She exhibits no edema.  Lymphadenopathy:    She has no cervical adenopathy.  Neurological: She is alert and oriented to person, place, and time.  Skin: Skin is warm and dry. No rash noted.  Psychiatric: She has a normal mood and affect. Her  behavior is normal.   Breast exam: No masses noted, axilla no adenopathy. External GU normal. Rectal exam normal, no stool for Hemoccult. Significant sun damage with multiple nevi and keratoses noted.        Assessment & Plan:  Well woman exam  Hypertension - Plan: Basic metabolic panel  Impaired fasting glucose - Plan: Basic metabolic panel, Hemoglobin A1c  Routine general medical examination at a health care facility - Plan: Basic metabolic panel, Hepatic function panel, Lipid panel, TSH, POC Hemoccult Bld/Stl (3-Cd Home Screen)  Sun damage with multiple skin lesions  Recommend followup with Dr. Orvan Falconer for skin cancer screening. Recommend dental exam. Daily vitamin D and calcium. Note bone density study is up-to-date. Recheck in 3-4 months, next physical in one year.

## 2013-07-31 ENCOUNTER — Other Ambulatory Visit: Payer: Self-pay | Admitting: *Deleted

## 2013-07-31 DIAGNOSIS — Z0000 Encounter for general adult medical examination without abnormal findings: Secondary | ICD-10-CM

## 2013-07-31 LAB — POC HEMOCCULT BLD/STL (HOME/3-CARD/SCREEN)
Card #2 Fecal Occult Blod, POC: NEGATIVE
Card #3 Fecal Occult Blood, POC: NEGATIVE
FECAL OCCULT BLD: NEGATIVE

## 2013-08-20 ENCOUNTER — Ambulatory Visit (INDEPENDENT_AMBULATORY_CARE_PROVIDER_SITE_OTHER): Payer: Medicare HMO | Admitting: Nurse Practitioner

## 2013-08-20 ENCOUNTER — Encounter: Payer: Self-pay | Admitting: Nurse Practitioner

## 2013-08-20 VITALS — BP 156/88 | Temp 98.0°F | Ht 67.0 in | Wt 188.0 lb

## 2013-08-20 DIAGNOSIS — E2839 Other primary ovarian failure: Secondary | ICD-10-CM

## 2013-08-20 DIAGNOSIS — R300 Dysuria: Secondary | ICD-10-CM

## 2013-08-20 LAB — POCT URINALYSIS DIPSTICK
PH UA: 6
Spec Grav, UA: 1.02

## 2013-08-20 MED ORDER — CIPROFLOXACIN HCL 250 MG PO TABS: 250.0000 mg | ORAL_TABLET | Freq: Two times a day (BID) | ORAL | Status: DC

## 2013-08-20 MED ORDER — ESTRADIOL 0.1 MG/GM VA CREA: TOPICAL_CREAM | VAGINAL | Status: DC

## 2013-08-22 LAB — URINE CULTURE

## 2013-08-24 ENCOUNTER — Encounter: Payer: Self-pay | Admitting: Nurse Practitioner

## 2013-08-24 NOTE — Progress Notes (Signed)
Subjective:  Presents complaints of a burning sensation at the urinary meatus for about a month. Does not always occur with urination. Mild urgency and frequency. No hematuria. No fever. No chills. No history of recent UTI. No vaginal discharge. No new sexual partners. No itching or burning in the vaginal area. No back pain. Minimal pelvic discomfort. Bowels normal limit. PMH includes hysterectomy with BSO.  Objective:   BP 156/88  Temp(Src) 98 F (36.7 C)  Ht 5\' 7"  (1.702 m)  Wt 188 lb (85.276 kg)  BMI 29.44 kg/m2 NAD. Alert, oriented. Lungs clear. No CVA or flank tenderness. Heart regular rate rhythm. Abdomen soft nondistended nontender. External GU skin pale and dry, no lesions or rash noted. Vagina slightly dry and pale with mild generalized irritation. UA and Urine microscopic negative.  Assessment:Dysuria - Plan: POCT urinalysis dipstick, POCT UA - Microscopic Only, Urine culture  Hypoestrogenism   Plan: Meds ordered this encounter  Medications  . ciprofloxacin (CIPRO) 250 MG tablet    Sig: Take 1 tablet (250 mg total) by mouth 2 (two) times daily.    Dispense:  10 tablet    Refill:  0    Order Specific Question:  Supervising Provider    Answer:  Mikey Kirschner [[id number]]  . estradiol (ESTRACE) 0.1 MG/GM vaginal cream    Sig: Apply externally to private area BID prn    Dispense:  42.5 g    Refill:  0    Order Specific Question:  Supervising Provider    Answer:  Mikey Kirschner [[id number]]   further followup based on culture report. Apply estradiol externally twice a day as needed for a few weeks at a time. Call back if symptoms worsen or persist.

## 2013-10-23 ENCOUNTER — Other Ambulatory Visit: Payer: Self-pay | Admitting: Family Medicine

## 2013-11-24 ENCOUNTER — Other Ambulatory Visit: Payer: Self-pay | Admitting: Nurse Practitioner

## 2013-12-25 ENCOUNTER — Encounter: Payer: Self-pay | Admitting: Family Medicine

## 2013-12-25 ENCOUNTER — Ambulatory Visit (INDEPENDENT_AMBULATORY_CARE_PROVIDER_SITE_OTHER): Payer: Medicare HMO | Admitting: Family Medicine

## 2013-12-25 VITALS — BP 128/88 | Temp 98.2°F | Ht 66.5 in | Wt 190.0 lb

## 2013-12-25 DIAGNOSIS — T148 Other injury of unspecified body region: Secondary | ICD-10-CM

## 2013-12-25 DIAGNOSIS — R51 Headache: Secondary | ICD-10-CM

## 2013-12-25 DIAGNOSIS — W57XXXA Bitten or stung by nonvenomous insect and other nonvenomous arthropods, initial encounter: Secondary | ICD-10-CM

## 2013-12-25 MED ORDER — DOXYCYCLINE HYCLATE 100 MG PO TABS: 100.0000 mg | ORAL_TABLET | Freq: Two times a day (BID) | ORAL | Status: DC

## 2013-12-25 NOTE — Progress Notes (Signed)
   Subjective:    Patient ID: Kayla [name], female    DOB: Feb 06, 1946, 68 y.o.   MRN: [id number]  HPI Pulled tick off of back about 3 weeks ago.  This week started having confusion.  Having body aches and headache. Started before she found the tick.   Feels hot at times  Eyes and head aching, plus posterior, approx this wk  Allergies have acted up   Review of Systems No rash elsewhere no chest pain noted 20 pains out of the ordinary no abdominal pain no change in bowel habits ROS otherwise negative    Objective:   Physical Exam  Alert no apparent distress hydration good. Lungs clear. Heart rare rhythm. H&T neck supple TMs normal, and it sharp papule noted at tick bite site      Assessment & Plan:  Impression tick bite with subsequent headache and possible low-grade fever. Though likely unrelated should cover. Discussed. Plan Doxy 100 twice a day 10 days. Symptomatic care discussed. Warning signs discussed. WSL

## 2014-01-07 ENCOUNTER — Ambulatory Visit (INDEPENDENT_AMBULATORY_CARE_PROVIDER_SITE_OTHER): Payer: Medicare HMO | Admitting: Nurse Practitioner

## 2014-01-07 ENCOUNTER — Encounter: Payer: Self-pay | Admitting: Nurse Practitioner

## 2014-01-07 VITALS — BP 132/92 | Ht 66.5 in | Wt 185.0 lb

## 2014-01-07 DIAGNOSIS — L819 Disorder of pigmentation, unspecified: Secondary | ICD-10-CM

## 2014-01-07 DIAGNOSIS — I10 Essential (primary) hypertension: Secondary | ICD-10-CM

## 2014-01-07 MED ORDER — HYDROCHLOROTHIAZIDE 25 MG PO TABS: ORAL_TABLET | ORAL | Status: DC

## 2014-01-07 MED ORDER — LISINOPRIL 20 MG PO TABS: ORAL_TABLET | ORAL | Status: DC

## 2014-01-12 ENCOUNTER — Encounter: Payer: Self-pay | Admitting: Nurse Practitioner

## 2014-01-12 NOTE — Progress Notes (Signed)
Subjective:  Presents for routine follow up. No CP/ischemic type pain or SOB. No edema. Regular exercise. Overall healthy diet. Requesting referral to dermatology for evaluation.  Objective:   BP 132/92  Ht 5' 6.5" (1.689 m)  Wt 185 lb (83.915 kg)  BMI 29.42 kg/m2 NAD. Alert, oriented. Lungs clear. Heart RRR. Lower extremities no edema. Multiple skin lesions particularly on back.   Assessment:  Problem List Items Addressed This Visit     Cardiovascular and Mediastinum   Hypertension - Primary   Relevant Medications      lisinopril (PRINIVIL,ZESTRIL) tablet      hydrochlorothiazide tablet    Other Visit Diagnoses   Atypical pigmented skin lesion        Relevant Orders       Ambulatory referral to Dermatology      Plan:  Meds ordered this encounter  Medications  . lisinopril (PRINIVIL,ZESTRIL) 20 MG tablet    Sig: TAKE 1 TABLET (20 MG TOTAL) BY MOUTH DAILY.    Dispense:  90 tablet    Refill:  1    Order Specific Question:  Supervising Provider    Answer:  Mikey Kirschner [[id number]]  . hydrochlorothiazide (HYDRODIURIL) 25 MG tablet    Sig: TAKE 1 TABLET BY MOUTH IN THE MORNING    Dispense:  90 tablet    Refill:  1    Order Specific Question:  Supervising Provider    Answer:  Mikey Kirschner [[id number]]   Return in about 6 months (around 07/09/2014). Recommend labs and PE at that time.

## 2014-05-03 ENCOUNTER — Other Ambulatory Visit: Payer: Self-pay | Admitting: Nurse Practitioner

## 2014-05-03 DIAGNOSIS — Z1231 Encounter for screening mammogram for malignant neoplasm of breast: Secondary | ICD-10-CM

## 2014-05-12 ENCOUNTER — Ambulatory Visit (HOSPITAL_COMMUNITY)
Admission: RE | Admit: 2014-05-12 | Discharge: 2014-05-12 | Disposition: A | Discharge status: Home / Self Care | Payer: Medicare HMO | Source: Ambulatory Visit | Attending: Nurse Practitioner | Admitting: Nurse Practitioner

## 2014-05-12 DIAGNOSIS — Z1231 Encounter for screening mammogram for malignant neoplasm of breast: Secondary | ICD-10-CM | POA: Diagnosis not present

## 2014-07-21 ENCOUNTER — Ambulatory Visit (INDEPENDENT_AMBULATORY_CARE_PROVIDER_SITE_OTHER): Payer: Commercial Managed Care - HMO | Admitting: Nurse Practitioner

## 2014-07-21 ENCOUNTER — Encounter (INDEPENDENT_AMBULATORY_CARE_PROVIDER_SITE_OTHER): Payer: Self-pay

## 2014-07-21 ENCOUNTER — Telehealth: Payer: Self-pay | Admitting: Family Medicine

## 2014-07-21 ENCOUNTER — Encounter: Payer: Self-pay | Admitting: Nurse Practitioner

## 2014-07-21 VITALS — BP 148/90 | Ht 66.5 in | Wt 191.0 lb

## 2014-07-21 DIAGNOSIS — R748 Abnormal levels of other serum enzymes: Secondary | ICD-10-CM

## 2014-07-21 DIAGNOSIS — IMO0002 Reserved for concepts with insufficient information to code with codable children: Secondary | ICD-10-CM

## 2014-07-21 DIAGNOSIS — Z0000 Encounter for general adult medical examination without abnormal findings: Secondary | ICD-10-CM

## 2014-07-21 DIAGNOSIS — Z23 Encounter for immunization: Secondary | ICD-10-CM

## 2014-07-21 DIAGNOSIS — Z1211 Encounter for screening for malignant neoplasm of colon: Secondary | ICD-10-CM

## 2014-07-21 DIAGNOSIS — R7989 Other specified abnormal findings of blood chemistry: Secondary | ICD-10-CM

## 2014-07-21 DIAGNOSIS — M8580 Other specified disorders of bone density and structure, unspecified site: Secondary | ICD-10-CM

## 2014-07-21 DIAGNOSIS — R7303 Prediabetes: Secondary | ICD-10-CM

## 2014-07-21 DIAGNOSIS — I10 Essential (primary) hypertension: Secondary | ICD-10-CM

## 2014-07-21 DIAGNOSIS — R7309 Other abnormal glucose: Secondary | ICD-10-CM

## 2014-07-21 LAB — BASIC METABOLIC PANEL
BUN: 18 mg/dL (ref 6–23)
CALCIUM: 9.4 mg/dL (ref 8.4–10.5)
CO2: 28 mEq/L (ref 19–32)
Chloride: 102 mEq/L (ref 96–112)
Creat: 1.14 mg/dL — ABNORMAL HIGH (ref 0.50–1.10)
Glucose, Bld: 98 mg/dL (ref 70–99)
Potassium: 4.3 mEq/L (ref 3.5–5.3)
Sodium: 139 mEq/L (ref 135–145)

## 2014-07-21 LAB — HEPATIC FUNCTION PANEL
ALBUMIN: 4.4 g/dL (ref 3.5–5.2)
ALT: 39 U/L — ABNORMAL HIGH (ref 0–35)
AST: 28 U/L (ref 0–37)
Alkaline Phosphatase: 52 U/L (ref 39–117)
Bilirubin, Direct: 0.1 mg/dL (ref 0.0–0.3)
Indirect Bilirubin: 0.5 mg/dL (ref 0.2–1.2)
TOTAL PROTEIN: 6.4 g/dL (ref 6.0–8.3)
Total Bilirubin: 0.6 mg/dL (ref 0.2–1.2)

## 2014-07-21 LAB — LIPID PANEL
Cholesterol: 182 mg/dL (ref 0–200)
HDL: 44 mg/dL (ref >39)
LDL CALC: 97 mg/dL (ref 0–99)
Total CHOL/HDL Ratio: 4.1 Ratio
Triglycerides: 203 mg/dL — ABNORMAL HIGH (ref <150)
VLDL: 41 mg/dL — ABNORMAL HIGH (ref 0–40)

## 2014-07-21 NOTE — Progress Notes (Signed)
Subjective:    Patient ID: Kayla [name], female    DOB: 12-16-1945, 68 y.o.   MRN: [id number]  HPI AWV- Annual Wellness Visit  The patient was seen for their annual wellness visit. The patient's past medical history, surgical history, and family history were reviewed. Pertinent vaccines were reviewed ( tetanus, pneumonia, shingles, flu) The patient's medication list was reviewed and updated.  The height and weight were entered. The patient's current BMI is: 30  Cognitive screening was completed. Outcome of Mini - Cog: PASS  Falls within the past 6 months: NO  Current tobacco usage: NO (All patients who use tobacco were given written and verbal information on quitting)  Recent listing of emergency department/hospitalizations over the past year were reviewed.  current specialist the patient sees on a regular basis: NO   Medicare annual wellness visit patient questionnaire was reviewed.  A written screening schedule for the patient for the next 5-10 years was given. Appropriate discussion of followup regarding next visit was discussed.  Presents for her wellness exam. Gets regular vision and dental exams. Regular skin cancer screenings. Is due for her colonoscopy. Has not had Zostavax. Overall healthy diet. Regular exercise.     Review of Systems  Constitutional: Negative for fever, activity change, appetite change and fatigue.  HENT: Negative for dental problem, ear pain, sinus pressure and sore throat.   Respiratory: Negative for cough, chest tightness, shortness of breath and wheezing.   Cardiovascular: Negative for chest pain.  Gastrointestinal: Negative for nausea, vomiting, abdominal pain, diarrhea, constipation and abdominal distention.  Genitourinary: Negative for dysuria, urgency, frequency, vaginal discharge, enuresis, difficulty urinating, genital sores and pelvic pain.       Objective:   Physical Exam  Constitutional: She is oriented to person, place, and  time. She appears well-developed. No distress.  HENT:  Right Ear: External ear normal.  Left Ear: External ear normal.  Mouth/Throat: Oropharynx is clear and moist.  Neck: Normal range of motion. Neck supple. No tracheal deviation present. No thyromegaly present.  Cardiovascular: Normal rate, regular rhythm and normal heart sounds.  Exam reveals no gallop.   No murmur heard. Pulmonary/Chest: Effort normal and breath sounds normal.  Abdominal: Soft. She exhibits no distension. There is no tenderness.  Genitourinary:  Defers pelvic and rectal exams. Has had a hysterectomy and bilateral oophorectomy. Plans colonoscopy in 2016.  Musculoskeletal: She exhibits no edema.  Lymphadenopathy:    She has no cervical adenopathy.  Neurological: She is alert and oriented to person, place, and time.  Skin: Skin is warm and dry. No rash noted.  Multiple keratoses, nevi and skin tags. Significant sun damage.  Psychiatric: She has a normal mood and affect. Her behavior is normal.  Vitals reviewed.  Breast exam: No masses; axilla no adenopathy.        Assessment & Plan:   Problem List Items Addressed This Visit      Cardiovascular and Mediastinum   Hypertension   Relevant Orders      Basic metabolic panel (Completed)     Endocrine   Prediabetes     Musculoskeletal and Integument   Osteopenia    Other Visit Diagnoses    Medicare annual wellness visit, subsequent    -  Primary    Relevant Orders       Pneumococcal conjugate vaccine 13-valent IM (Completed)       Lipid panel (Completed)       Hepatic function panel (Completed)       Basic  metabolic panel (Completed)       TSH (Completed)    Need for vaccination        Relevant Orders       Pneumococcal conjugate vaccine 13-valent IM (Completed)      Recheck BP outside of office and call back if remains elevated. Prevnar vaccine today. Given information on colonoscopy and prescription for Zostavax. Plan bone density study in 2016.  Continue vitamin D and calcium supplement. Continue skin cancer screening with dermatology. Return in about 6 months (around 01/20/2015).

## 2014-07-21 NOTE — Telephone Encounter (Signed)
Patient needs referral for colonoscopy to Dr. Gala Romney.

## 2014-07-21 NOTE — Telephone Encounter (Signed)
Order for referral put in. Pt notified.  

## 2014-07-21 NOTE — Telephone Encounter (Signed)
Pt states she called Dr. Orvan Falconer office and was told because of her insurance she needs referral

## 2014-07-22 LAB — TSH: TSH: 3.035 u[IU]/mL (ref 0.350–4.500)

## 2014-07-25 ENCOUNTER — Encounter: Payer: Self-pay | Admitting: Nurse Practitioner

## 2014-08-05 NOTE — Addendum Note (Signed)
Addended byCharolotte Capuchin D on: 08/05/2014 02:32 PM   Modules accepted: Orders

## 2014-08-06 NOTE — Progress Notes (Signed)
Patient notified and verbalized understanding of test results. No further questions.  

## 2014-08-12 ENCOUNTER — Telehealth: Payer: Self-pay

## 2014-08-12 NOTE — Telephone Encounter (Signed)
PATIENT CALLED TO SCHEDULE A COLONOSCOPY  PLEASE CALL BACK

## 2014-08-12 NOTE — Telephone Encounter (Signed)
Patient was returning DS call, but DS was not available. Please call patient back at [phone]

## 2014-08-12 NOTE — Telephone Encounter (Signed)
LMOM to call.

## 2014-08-19 ENCOUNTER — Other Ambulatory Visit: Payer: Self-pay

## 2014-08-19 DIAGNOSIS — Z1211 Encounter for screening for malignant neoplasm of colon: Secondary | ICD-10-CM

## 2014-08-19 NOTE — Telephone Encounter (Signed)
Please see separate triage.

## 2014-08-19 NOTE — Telephone Encounter (Signed)
Gastroenterology Pre-Procedure Review  Request Date: 08/19/2014 Requesting Physician: Dr. Wolfgang Phoenix  PATIENT REVIEW QUESTIONS: The patient responded to the following health history questions as indicated:    Last colonoscopy was 07/29/2007/ Family hx of colon cancer in her father and brother Father deceased age 71/ brother deceased age 11 not very long after being diagnosed with colon cancer  1. Diabetes Melitis: no 2. Joint replacements in the past 12 months: no 3. Major health problems in the past 3 months: no 4. Has an artificial valve or MVP: no 5. Has a defibrillator: no 6. Has been advised in past to take antibiotics in advance of a procedure like teeth cleaning: no    MEDICATIONS & ALLERGIES:    Patient reports the following regarding taking any blood thinners:   Plavix? no Aspirin? no Coumadin? no  Patient confirms/reports the following medications:  Current Outpatient Prescriptions  Medication Sig Dispense Refill  . albuterol (PROVENTIL HFA;VENTOLIN HFA) 108 (90 BASE) MCG/ACT inhaler Inhale 2 puffs into the lungs every 4 (four) hours as needed for wheezing. 1 Inhaler 5  . diclofenac (VOLTAREN) 75 MG EC tablet Take 1 tablet (75 mg total) by mouth 2 (two) times daily as needed. 60 tablet 2  . hydrochlorothiazide (HYDRODIURIL) 25 MG tablet TAKE 1 TABLET BY MOUTH IN THE MORNING 90 tablet 1  . lisinopril (PRINIVIL,ZESTRIL) 20 MG tablet TAKE 1 TABLET (20 MG TOTAL) BY MOUTH DAILY. 90 tablet 1  . NON FORMULARY OTC allergy as needed only     No current facility-administered medications for this visit.    Patient confirms/reports the following allergies:  Allergies  Allergen Reactions  . Verapamil     swelling    No orders of the defined types were placed in this encounter.    AUTHORIZATION INFORMATION Primary Insurance:  ID #:  Group #:  Pre-Cert / Auth required:  Pre-Cert / Auth #:   Secondary Insurance:   ID #:   Group #:  Pre-Cert / Auth required:  Pre-Cert / Auth  #:   SCHEDULE INFORMATION: Procedure has been scheduled as follows:  Date:  09/15/2014           Time: 8:15 AM  Location: [facility] Short Stay  This Gastroenterology Pre-Precedure Review Form is being routed to the following provider(s): R. Garfield Cornea, MD

## 2014-08-23 NOTE — Telephone Encounter (Signed)
Ok to schedule, thanks!

## 2014-08-24 MED ORDER — PEG-KCL-NACL-NASULF-NA ASC-C 100 G PO SOLR: 1.0000 | ORAL | Status: DC

## 2014-08-24 NOTE — Addendum Note (Signed)
Addended by: Everardo All on: 08/24/2014 10:17 AM   Modules accepted: Orders

## 2014-08-24 NOTE — Telephone Encounter (Signed)
Rx sent to the pharmacy and instructions mailed to pt.  

## 2014-08-27 DIAGNOSIS — R748 Abnormal levels of other serum enzymes: Secondary | ICD-10-CM | POA: Diagnosis not present

## 2014-08-28 LAB — CREATININE, SERUM: Creat: 0.99 mg/dL (ref 0.50–1.10)

## 2014-08-31 NOTE — Progress Notes (Signed)
Patient notified and verbalized understanding of the test results. No further questions. 

## 2014-09-09 ENCOUNTER — Telehealth: Payer: Self-pay

## 2014-09-09 ENCOUNTER — Telehealth: Payer: Self-pay | Admitting: Internal Medicine

## 2014-09-09 NOTE — Telephone Encounter (Signed)
Screening colonoscopy approval # is [id number] from [place] [facility] [employer].

## 2014-09-09 NOTE — Telephone Encounter (Signed)
Pt said [facility] hasn't received anything from [place] regarding her prep, but in epic it say it was confirmed by pharmacy. Please resend prep prescription so patient can pick it up tomorrow.

## 2014-09-09 NOTE — Telephone Encounter (Signed)
I called the pharmacist and confirmed the prescription was received there on 08/24/2014. They will fill for pt and she is aware.

## 2014-09-10 ENCOUNTER — Telehealth: Payer: Self-pay

## 2014-09-10 MED ORDER — PEG 3350-KCL-NA BICARB-NACL 420 G PO SOLR: 4000.0000 mL | ORAL | Status: DC

## 2014-09-10 NOTE — Telephone Encounter (Signed)
Pt called and said the Movie [employer] is too expensive. I sent in [employer] and faxed instructions to [employer].

## 2014-09-15 ENCOUNTER — Encounter (HOSPITAL_COMMUNITY)
Admission: RE | Disposition: A | Discharge status: Home / Self Care | Payer: Self-pay | Source: Ambulatory Visit | Attending: Internal Medicine

## 2014-09-15 ENCOUNTER — Ambulatory Visit (HOSPITAL_COMMUNITY)
Admission: RE | Admit: 2014-09-15 | Discharge: 2014-09-15 | Disposition: A | Discharge status: Home / Self Care | Payer: Commercial Managed Care - HMO | Source: Ambulatory Visit | Attending: Internal Medicine | Admitting: Internal Medicine

## 2014-09-15 ENCOUNTER — Encounter (HOSPITAL_COMMUNITY): Payer: Self-pay | Admitting: *Deleted

## 2014-09-15 DIAGNOSIS — Z8249 Family history of ischemic heart disease and other diseases of the circulatory system: Secondary | ICD-10-CM | POA: Diagnosis not present

## 2014-09-15 DIAGNOSIS — K589 Irritable bowel syndrome without diarrhea: Secondary | ICD-10-CM | POA: Insufficient documentation

## 2014-09-15 DIAGNOSIS — D124 Benign neoplasm of descending colon: Secondary | ICD-10-CM | POA: Diagnosis not present

## 2014-09-15 DIAGNOSIS — Z800 Family history of malignant neoplasm of digestive organs: Secondary | ICD-10-CM | POA: Diagnosis not present

## 2014-09-15 DIAGNOSIS — M8580 Other specified disorders of bone density and structure, unspecified site: Secondary | ICD-10-CM | POA: Insufficient documentation

## 2014-09-15 DIAGNOSIS — D125 Benign neoplasm of sigmoid colon: Secondary | ICD-10-CM | POA: Insufficient documentation

## 2014-09-15 DIAGNOSIS — Z79899 Other long term (current) drug therapy: Secondary | ICD-10-CM | POA: Diagnosis not present

## 2014-09-15 DIAGNOSIS — I10 Essential (primary) hypertension: Secondary | ICD-10-CM | POA: Diagnosis not present

## 2014-09-15 DIAGNOSIS — Z1211 Encounter for screening for malignant neoplasm of colon: Secondary | ICD-10-CM | POA: Diagnosis not present

## 2014-09-15 DIAGNOSIS — J45909 Unspecified asthma, uncomplicated: Secondary | ICD-10-CM | POA: Diagnosis not present

## 2014-09-15 DIAGNOSIS — K219 Gastro-esophageal reflux disease without esophagitis: Secondary | ICD-10-CM | POA: Diagnosis not present

## 2014-09-15 DIAGNOSIS — Z86010 Personal history of colonic polyps: Secondary | ICD-10-CM | POA: Insufficient documentation

## 2014-09-15 HISTORY — PX: COLONOSCOPY: SHX5424

## 2014-09-15 SURGERY — COLONOSCOPY
Anesthesia: Moderate Sedation

## 2014-09-15 MED ORDER — MIDAZOLAM HCL 5 MG/5ML IJ SOLN
INTRAMUSCULAR | Status: DC | PRN
  Administered 2014-09-15 (×2): 2 mg via INTRAVENOUS
  Administered 2014-09-15: 1 mg via INTRAVENOUS

## 2014-09-15 MED ORDER — ONDANSETRON HCL 4 MG/2ML IJ SOLN
INTRAMUSCULAR | Status: AC
  Filled 2014-09-15: qty 2

## 2014-09-15 MED ORDER — ONDANSETRON HCL 4 MG/2ML IJ SOLN
INTRAMUSCULAR | Status: DC | PRN
Start: 2014-09-15 — End: 2014-09-15
  Administered 2014-09-15: 4 mg via INTRAVENOUS

## 2014-09-15 MED ORDER — SODIUM CHLORIDE 0.9 % IV SOLN
INTRAVENOUS | Status: DC
  Administered 2014-09-15: 08:00:00 via INTRAVENOUS

## 2014-09-15 MED ORDER — MEPERIDINE HCL 100 MG/ML IJ SOLN
INTRAMUSCULAR | Status: DC | PRN
  Administered 2014-09-15: 25 mg via INTRAVENOUS
  Administered 2014-09-15: 50 mg via INTRAVENOUS

## 2014-09-15 MED ORDER — MEPERIDINE HCL 100 MG/ML IJ SOLN
INTRAMUSCULAR | Status: AC
  Filled 2014-09-15: qty 2

## 2014-09-15 MED ORDER — MIDAZOLAM HCL 5 MG/5ML IJ SOLN
INTRAMUSCULAR | Status: AC
  Filled 2014-09-15: qty 10

## 2014-09-15 MED ORDER — SIMETHICONE 40 MG/0.6ML PO SUSP
ORAL | Status: DC | PRN
  Administered 2014-09-15: 09:00:00

## 2014-09-15 NOTE — H&P (Signed)
'@LOGO' @   Primary Care Physician:  Rubbie Battiest, MD Primary Gastroenterologist:  Dr. Gala Romney  Pre-Procedure History & Physical: HPI:  Kayla [name] is a 69 y.o. female is here for a screening colonoscopy. Reported negative colonoscopy 5 years ago. No bowel symptoms. Positive family history of colon cancer in brother and father.  Past Medical History  Diagnosis Date  . Asthma   . Hypertension   . Irritable bowel syndrome   . Tinnitus   . 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 Chest discomfort   . GERD (gastroesophageal reflux disease)   . Impaired fasting glucose   . Venous insufficiency   . Osteopenia     Past Surgical History  Procedure Laterality Date  . Lumbar disc surgery    . Carpal tunnel release      Bilateral  . Partial hysterectomy    . Abdominal hysterectomy    . Appendectomy      Prior to Admission medications   Medication Sig Start Date End Date Taking? Authorizing Provider  albuterol (PROVENTIL HFA;VENTOLIN HFA) 108 (90 BASE) MCG/ACT inhaler Inhale 2 puffs into the lungs every 4 (four) hours as needed for wheezing. 07/20/13  Yes Nilda Simmer, NP  diclofenac (VOLTAREN) 75 MG EC tablet Take 1 tablet (75 mg total) by mouth 2 (two) times daily as needed. 07/20/13  Yes Nilda Simmer, NP  hydrochlorothiazide (HYDRODIURIL) 25 MG tablet TAKE 1 TABLET BY MOUTH IN THE MORNING 01/07/14  Yes Nilda Simmer, NP  lisinopril (PRINIVIL,ZESTRIL) 20 MG tablet TAKE 1 TABLET (20 MG TOTAL) BY MOUTH DAILY. 01/07/14  Yes Nilda Simmer, NP  NON FORMULARY OTC allergy as needed only   Yes Historical Provider, MD  peg 3350 powder (MOVIPREP) 100 G SOLR Take 1 kit (200 g total) by mouth as directed. 08/24/14  Yes Daneil Dolin, MD  polyethylene glycol-electrolytes (TRILYTE) 420 G solution Take 4,000 mLs by mouth as directed. 09/10/14  Yes Daneil Dolin, MD    Allergies as of 08/19/2014 - Review Complete 08/19/2014  Allergen Reaction Noted  .  Verapamil  07/19/2013    Family History  Problem Relation Age of Onset  . Cancer Father     colon cancer; also brother-colon And sister-breast  . Heart disease Father   . Heart disease Mother     at advanced age  . Hypertension Mother   . Hyperlipidemia Mother   . Osteoporosis Mother   . Cancer Brother     liver and colon  . Cancer Sister 63    breast    History   Social History  . Marital Status: Married    Spouse Name: N/A  . Number of Children: 2  . Years of Education: N/A   Occupational History  . Small business owner     Laundry, [place] History Main Topics  . Smoking status: Never Smoker   . Smokeless tobacco: Never Used  . Alcohol Use: 0.5 oz/week    1 Standard drinks or equivalent per week  . Drug Use: No  . Sexual Activity: Not Currently    Birth Control/ Protection: Post-menopausal   Other Topics Concern  . Not on file   Social History Narrative   Exercises regularly    Review of Systems: See HPI, otherwise negative ROS  Physical Exam: BP 123/85 mmHg  Pulse 77  Temp(Src) 97.8 F (36.6 C) (Oral)  Resp 15  Ht 5' 6.5" (1.689 m)  Wt 193 lb (87.544 kg)  BMI 30.69 kg/m2  SpO2 95% General:   Alert,  Well-developed, well-nourished, pleasant and cooperative in NAD Head:  Normocephalic and atraumatic. Eyes:  Sclera clear, no icterus.   Conjunctiva pink. Ears:  Normal auditory acuity. Nose:  No deformity, discharge,  or lesions. Mouth:  No deformity or lesions, dentition normal. Neck:  Supple; no masses or thyromegaly. Lungs:  Clear throughout to auscultation.   No wheezes, crackles, or rhonchi. No acute distress. Heart:  Regular rate and rhythm; no murmurs, clicks, rubs,  or gallops. Abdomen:  Soft, nontender and nondistended. No masses, hepatosplenomegaly or hernias noted. Normal bowel sounds, without guarding, and without rebound.   Msk:  Symmetrical without gross deformities. Normal posture. Pulses:  Normal pulses noted. Extremities:   Without clubbing or edema. Neurologic:  Alert and  oriented x4;  grossly normal neurologically. Skin:  Intact without significant lesions or rashes. Cervical Nodes:  No significant cervical adenopathy. Psych:  Alert and cooperative. Normal mood and affect.  Impression/Plan: Kayla [name] is now here to undergo a screening colonoscopy.  High-risk screening examination. Risks, benefits, limitations, imponderables and alternatives regarding colonoscopy have been reviewed with the patient. Questions have been answered. All parties agreeable.     Notice:  This dictation was prepared with Dragon dictation along with smaller phrase technology. Any transcriptional errors that result from this process are unintentional and may not be corrected upon review.

## 2014-09-15 NOTE — Discharge Instructions (Signed)

## 2014-09-15 NOTE — Op Note (Signed)
[facility] [address] [place], [address]   COLONOSCOPY PROCEDURE REPORT  PATIENT: Kayla [name], Kayla [name]  MR#: [id number] BIRTHDATE: 1946/03/02 , 62  yrs. old GENDER: female ENDOSCOPIST: R.  Garfield Cornea, MD FACP [facility] REFERRED [id number] Wolfgang Phoenix, M.D. PROCEDURE DATE:  October 02, 2014 PROCEDURE:   Colonoscopy with biopsy and Colonoscopy with snare polypectomy INDICATIONS:high-risk screening examination; 2 first-degree relatives with colorectal carcinoma. MEDICATIONS: Versed 5 mg IV and Demerol 75 mg IV in divided doses. Zofran 4 mg IV. ASA CLASS:       Class II  CONSENT: The risks, benefits, alternatives and imponderables including but not limited to bleeding, perforation as well as the possibility of a missed lesion have been reviewed.  The potential for biopsy, lesion removal, etc. have also been discussed. Questions have been answered.  All parties agreeable.  Please see the history and physical in the medical record for more information.  DESCRIPTION OF PROCEDURE:   After the risks benefits and alternatives of the procedure were thoroughly explained, informed consent was obtained.  The digital rectal exam      The EC-[id number] ([id number])  endoscope was introduced through the anus and advanced to the cecum, which was identified by both the appendix and ileocecal valve. No adverse events experienced.   The quality of the prep was adequate  The instrument was then slowly withdrawn as the colon was fully examined.      COLON FINDINGS: Normal rectum.; (1) diminutive polyp in the mid sigmoid segment and a single 4 mm pedunculated polyp in the mid descending segment; otherwise, the remainder of the colonic mucosa appeared normal. The above-mentioned polyps were cold biopsy and cold snare removed, respectively. Retroflexion was performed. .  Withdrawal time=9 minutes 0 seconds.  The scope was withdrawn and the procedure completed. COMPLICATIONS: There were no  immediate complications.  ENDOSCOPIC IMPRESSION: Multiple colonic polyps?"removed as described above. RECOMMENDATIONS: Follow-up on pathology.  eSigned:  R. Garfield Cornea, MD Rosalita Chessman [facility] 10-02-2014 9:07 AM   cc:  CPT CODES: ICD CODES:  The ICD and CPT codes recommended by this software are interpretations from the data that the clinical staff has captured with the software.  The verification of the translation of this report to the ICD and CPT codes and modifiers is the sole responsibility of the health care institution and practicing physician where this report was generated.  [employer] will not be held responsible for the validity of the ICD and CPT codes included on this report.  AMA assumes no liability for data contained or not contained herein. CPT is a Designer, television/film set of the [employer].  PATIENT NAME:  Liel, Rudden MR#: [id number]

## 2014-09-16 ENCOUNTER — Encounter (HOSPITAL_COMMUNITY): Payer: Self-pay | Admitting: Internal Medicine

## 2014-09-16 ENCOUNTER — Encounter: Payer: Self-pay | Admitting: Internal Medicine

## 2014-09-16 ENCOUNTER — Other Ambulatory Visit: Payer: Self-pay | Admitting: Nurse Practitioner

## 2014-09-17 DIAGNOSIS — H5210 Myopia, unspecified eye: Secondary | ICD-10-CM | POA: Diagnosis not present

## 2014-09-17 DIAGNOSIS — H5200 Hypermetropia, unspecified eye: Secondary | ICD-10-CM | POA: Diagnosis not present

## 2014-09-21 ENCOUNTER — Other Ambulatory Visit: Payer: Self-pay | Admitting: Nurse Practitioner

## 2014-10-06 ENCOUNTER — Ambulatory Visit (HOSPITAL_COMMUNITY)
Admission: RE | Admit: 2014-10-06 | Discharge: 2014-10-06 | Disposition: A | Discharge status: Home / Self Care | Payer: Medicare HMO | Source: Ambulatory Visit | Attending: Family Medicine | Admitting: Family Medicine

## 2014-10-06 ENCOUNTER — Encounter: Payer: Self-pay | Admitting: Family Medicine

## 2014-10-06 ENCOUNTER — Ambulatory Visit (INDEPENDENT_AMBULATORY_CARE_PROVIDER_SITE_OTHER): Payer: Commercial Managed Care - HMO | Admitting: Family Medicine

## 2014-10-06 VITALS — BP 148/98 | Temp 98.2°F | Ht 66.5 in | Wt 193.0 lb

## 2014-10-06 DIAGNOSIS — R0609 Other forms of dyspnea: Secondary | ICD-10-CM | POA: Diagnosis not present

## 2014-10-06 DIAGNOSIS — M47894 Other spondylosis, thoracic region: Secondary | ICD-10-CM | POA: Diagnosis not present

## 2014-10-06 DIAGNOSIS — R05 Cough: Secondary | ICD-10-CM | POA: Diagnosis not present

## 2014-10-06 DIAGNOSIS — R059 Cough, unspecified: Secondary | ICD-10-CM

## 2014-10-06 DIAGNOSIS — I700 Atherosclerosis of aorta: Secondary | ICD-10-CM | POA: Insufficient documentation

## 2014-10-06 DIAGNOSIS — J200 Acute bronchitis due to Mycoplasma pneumoniae: Secondary | ICD-10-CM

## 2014-10-06 DIAGNOSIS — R0600 Dyspnea, unspecified: Secondary | ICD-10-CM | POA: Diagnosis not present

## 2014-10-06 DIAGNOSIS — R062 Wheezing: Secondary | ICD-10-CM | POA: Insufficient documentation

## 2014-10-06 MED ORDER — AZITHROMYCIN 250 MG PO TABS: ORAL_TABLET | ORAL | Status: DC

## 2014-10-06 MED ORDER — SULFACETAMIDE SODIUM 10 % OP SOLN: 2.0000 [drp] | Freq: Four times a day (QID) | OPHTHALMIC | Status: DC

## 2014-10-06 NOTE — Progress Notes (Signed)
   Subjective:    Patient ID: Kayla [name], female    DOB: 09/04/1945, 69 y.o.   MRN: [id number]  Cough This is a new problem. Episode onset: a month ago. The problem has been gradually worsening. Cough characteristics: productive of black sputum (per pt) but now it is clear. Associated symptoms include chest pain, headaches, nasal congestion, rhinorrhea and wheezing. Pertinent negatives include no ear pain, fever or shortness of breath. Nothing aggravates the symptoms. She has tried OTC cough suppressant and steroid inhaler for the symptoms. The treatment provided mild relief. Her past medical history is significant for asthma.      Review of Systems  Constitutional: Negative for fever and activity change.  HENT: Positive for congestion and rhinorrhea. Negative for ear pain.   Eyes: Negative for discharge.  Respiratory: Positive for cough and wheezing. Negative for shortness of breath.   Cardiovascular: Positive for chest pain.  Neurological: Positive for headaches.       Objective:   Physical Exam  Constitutional: She appears well-developed.  HENT:  Head: Normocephalic.  Nose: Nose normal.  Mouth/Throat: Oropharynx is clear and moist. No oropharyngeal exudate.  Neck: Neck supple.  Cardiovascular: Normal rate and normal heart sounds.   No murmur heard. Pulmonary/Chest: Effort normal and breath sounds normal. She has no wheezes.  Lymphadenopathy:    She has no cervical adenopathy.  Skin: Skin is warm and dry.  Nursing note and vitals reviewed.  on examination her right eye has some redness of the sclera especially in the medial aspect and redness of the lower conjunctiva  EKG was completed because patient states that she felt out of breath and felt somewhat heavy in her chest when she exerted herself but she admits to having some wheezing and coughing. She denies having chest pain before this particular illness. She states she generally stays physically active without  problem.      Assessment & Plan:  Bronchitis-atypical. Cover with azithromycin. I recommend follow-up office visit if not improved over the course of the next few days sooner if worse. Call if any problems. Warning signs discussed.  Probable mild viral conjunctivitis versus secondary conjunctivitis antibiotic drops for the next few days if persistent follow-up

## 2014-10-20 ENCOUNTER — Encounter: Payer: Self-pay | Admitting: Family Medicine

## 2014-12-20 ENCOUNTER — Other Ambulatory Visit: Payer: Self-pay | Admitting: Nurse Practitioner

## 2015-01-03 ENCOUNTER — Encounter: Payer: Self-pay | Admitting: Nurse Practitioner

## 2015-01-03 ENCOUNTER — Ambulatory Visit (INDEPENDENT_AMBULATORY_CARE_PROVIDER_SITE_OTHER): Payer: Commercial Managed Care - HMO | Admitting: Nurse Practitioner

## 2015-01-03 VITALS — BP 122/80 | Temp 98.2°F | Ht 66.5 in | Wt 192.0 lb

## 2015-01-03 DIAGNOSIS — W57XXXA Bitten or stung by nonvenomous insect and other nonvenomous arthropods, initial encounter: Secondary | ICD-10-CM

## 2015-01-03 DIAGNOSIS — B349 Viral infection, unspecified: Secondary | ICD-10-CM | POA: Diagnosis not present

## 2015-01-03 DIAGNOSIS — T148 Other injury of unspecified body region: Secondary | ICD-10-CM

## 2015-01-03 MED ORDER — LISINOPRIL 20 MG PO TABS: 20.0000 mg | ORAL_TABLET | Freq: Every day | ORAL | Status: DC

## 2015-01-03 MED ORDER — ALBUTEROL SULFATE HFA 108 (90 BASE) MCG/ACT IN AERS: 2.0000 | INHALATION_SPRAY | RESPIRATORY_TRACT | Status: DC | PRN

## 2015-01-03 MED ORDER — HYDROCHLOROTHIAZIDE 25 MG PO TABS: 25.0000 mg | ORAL_TABLET | Freq: Every morning | ORAL | Status: DC

## 2015-01-03 NOTE — Progress Notes (Signed)
Subjective:  Presents for c/o fever, headache, cough, malaise, muscle aches and right ear pain that began yesterday afternoon. Slight wheeze at times. No sore throat. No rash. Has had 2 tick bites within the past 2 weeks. No V/D.   Objective:   BP 122/80 mmHg  Temp(Src) 98.2 F (36.8 C) (Oral)  Ht 5' 6.5" (1.689 m)  Wt 192 lb (87.091 kg)  BMI 30.53 kg/m2 NAD. Alert, oriented. Left TM normal; RT TM dull with clear effusion. Pharynx mildly injected with PND noted. Neck supple with mild anterior adenopathy. Lungs clear. Heart RRR. Faint pink papules at site of tick bites left arm and left upper anterior chest.   Assessment: Viral illness  Tick bites  Plan:  Meds ordered this encounter  Medications  . lisinopril (PRINIVIL,ZESTRIL) 20 MG tablet    Sig: Take 1 tablet (20 mg total) by mouth daily.    Dispense:  90 tablet    Refill:  0    Order Specific Question:  Supervising Provider    Answer:  Mikey Kirschner [[id number]]  . hydrochlorothiazide (HYDRODIURIL) 25 MG tablet    Sig: Take 1 tablet (25 mg total) by mouth every morning.    Dispense:  90 tablet    Refill:  0    Order Specific Question:  Supervising Provider    Answer:  Mikey Kirschner [[id number]]  . albuterol (PROVENTIL HFA;VENTOLIN HFA) 108 (90 BASE) MCG/ACT inhaler    Sig: Inhale 2 puffs into the lungs every 4 (four) hours as needed for wheezing.    Dispense:  1 Inhaler    Refill:  5    Order Specific Question:  Supervising Provider    Answer:  Mikey Kirschner [[id number]]   With sudden onset and multiple symptoms, most likely viral illness. Reviewed symptomatic care and warning signs. Call back in 48-72 hours if no improvement, sooner if worse. Also reviewed signs of tick fever. At end of visit requested refills on BP meds. Recommend office visit for chronic health problems.  Return if symptoms worsen or fail to improve.

## 2015-05-10 ENCOUNTER — Telehealth: Payer: Self-pay | Admitting: Family Medicine

## 2015-05-10 MED ORDER — HYDROCHLOROTHIAZIDE 25 MG PO TABS: 25.0000 mg | ORAL_TABLET | Freq: Every morning | ORAL | Status: DC

## 2015-05-10 MED ORDER — LISINOPRIL 20 MG PO TABS: 20.0000 mg | ORAL_TABLET | Freq: Every day | ORAL | Status: DC

## 2015-05-10 NOTE — Telephone Encounter (Signed)
Rx sent electronically to pharmacy. Patient notified. 

## 2015-05-10 NOTE — Telephone Encounter (Signed)
Patient needing refills on hydroclorothiazide 25 mg and lisinopril 20 mg has appointment on 10/19 for med check.[facility]

## 2015-05-18 ENCOUNTER — Encounter: Payer: Self-pay | Admitting: Nurse Practitioner

## 2015-05-18 ENCOUNTER — Ambulatory Visit (INDEPENDENT_AMBULATORY_CARE_PROVIDER_SITE_OTHER): Payer: Commercial Managed Care - HMO | Admitting: Nurse Practitioner

## 2015-05-18 VITALS — BP 124/86 | Ht 66.5 in | Wt 190.0 lb

## 2015-05-18 DIAGNOSIS — R7303 Prediabetes: Secondary | ICD-10-CM | POA: Diagnosis not present

## 2015-05-18 DIAGNOSIS — I10 Essential (primary) hypertension: Secondary | ICD-10-CM | POA: Diagnosis not present

## 2015-05-18 DIAGNOSIS — Z79899 Other long term (current) drug therapy: Secondary | ICD-10-CM | POA: Diagnosis not present

## 2015-05-18 DIAGNOSIS — Z13220 Encounter for screening for lipoid disorders: Secondary | ICD-10-CM

## 2015-05-18 MED ORDER — HYDROCHLOROTHIAZIDE 25 MG PO TABS: 25.0000 mg | ORAL_TABLET | Freq: Every morning | ORAL | Status: DC

## 2015-05-18 MED ORDER — LISINOPRIL 20 MG PO TABS: 20.0000 mg | ORAL_TABLET | Freq: Every day | ORAL | Status: DC

## 2015-05-18 MED ORDER — DICLOFENAC SODIUM 75 MG PO TBEC: 75.0000 mg | DELAYED_RELEASE_TABLET | Freq: Two times a day (BID) | ORAL | Status: DC | PRN

## 2015-05-18 NOTE — Progress Notes (Signed)
Subjective:  Presents for routine follow up. Stays active. Doing well with diet. No CP/ischemic type pain or SOB. No edema. Takes Diclofenac for significant arthritis pain. States she could not function without it. No acid reflux or abd pain.   Objective:   BP 124/86 mmHg  Ht 5' 6.5" (1.689 m)  Wt 190 lb (86.183 kg)  BMI 30.21 kg/m2 NAD. Alert, oriented. Lungs clear. Heart RRR. Lower extremities no edema.  Assessment:  Problem List Items Addressed This Visit      Cardiovascular and Mediastinum   Hypertension - Primary   Relevant Medications   lisinopril (PRINIVIL,ZESTRIL) 20 MG tablet   hydrochlorothiazide (HYDRODIURIL) 25 MG tablet   Other Relevant Orders   Basic metabolic panel     Other   Prediabetes   Relevant Orders   Hemoglobin A1c    Other Visit Diagnoses    Screening for lipid disorders        Relevant Orders    Lipid panel    High risk medication use        Relevant Orders    Hepatic function panel      Plan:  Meds ordered this encounter  Medications  . diclofenac (VOLTAREN) 75 MG EC tablet    Sig: Take 1 tablet (75 mg total) by mouth 2 (two) times daily as needed.    Dispense:  60 tablet    Refill:  2    Order Specific Question:  Supervising Provider    Answer:  Mikey Kirschner [[id number]]  . lisinopril (PRINIVIL,ZESTRIL) 20 MG tablet    Sig: Take 1 tablet (20 mg total) by mouth daily.    Dispense:  30 tablet    Refill:  5    Order Specific Question:  Supervising Provider    Answer:  Mikey Kirschner [[id number]]  . hydrochlorothiazide (HYDRODIURIL) 25 MG tablet    Sig: Take 1 tablet (25 mg total) by mouth every morning.    Dispense:  30 tablet    Refill:  5    Order Specific Question:  Supervising Provider    Answer:  Mikey Kirschner [[id number]]   Return in about 2 months (around 07/18/2015) for physical. Routine labs ordered.

## 2015-07-06 DIAGNOSIS — R7303 Prediabetes: Secondary | ICD-10-CM | POA: Diagnosis not present

## 2015-07-06 DIAGNOSIS — Z79899 Other long term (current) drug therapy: Secondary | ICD-10-CM | POA: Diagnosis not present

## 2015-07-06 DIAGNOSIS — Z13220 Encounter for screening for lipoid disorders: Secondary | ICD-10-CM | POA: Diagnosis not present

## 2015-07-06 DIAGNOSIS — I10 Essential (primary) hypertension: Secondary | ICD-10-CM | POA: Diagnosis not present

## 2015-07-07 LAB — LIPID PANEL
Chol/HDL Ratio: 4.7 ratio units — ABNORMAL HIGH (ref 0.0–4.4)
Cholesterol, Total: 204 mg/dL — ABNORMAL HIGH (ref 100–199)
HDL: 43 mg/dL (ref >39)
LDL Calculated: 110 mg/dL — ABNORMAL HIGH (ref 0–99)
TRIGLYCERIDES: 255 mg/dL — AB (ref 0–149)
VLDL Cholesterol Cal: 51 mg/dL — ABNORMAL HIGH (ref 5–40)

## 2015-07-07 LAB — BASIC METABOLIC PANEL
BUN / CREAT RATIO: 17 (ref 11–26)
BUN: 17 mg/dL (ref 8–27)
CHLORIDE: 99 mmol/L (ref 97–106)
CO2: 25 mmol/L (ref 18–29)
Calcium: 9.3 mg/dL (ref 8.7–10.3)
Creatinine, Ser: 1.03 mg/dL — ABNORMAL HIGH (ref 0.57–1.00)
GFR calc Af Amer: 63 mL/min/{1.73_m2} (ref >59)
GFR calc non Af Amer: 55 mL/min/{1.73_m2} — ABNORMAL LOW (ref >59)
Glucose: 107 mg/dL — ABNORMAL HIGH (ref 65–99)
Potassium: 4.2 mmol/L (ref 3.5–5.2)
Sodium: 141 mmol/L (ref 136–144)

## 2015-07-07 LAB — HEPATIC FUNCTION PANEL
ALT: 54 IU/L — AB (ref 0–32)
AST: 42 IU/L — AB (ref 0–40)
Albumin: 4.8 g/dL (ref 3.5–4.8)
Alkaline Phosphatase: 67 IU/L (ref 39–117)
Bilirubin Total: 0.7 mg/dL (ref 0.0–1.2)
Bilirubin, Direct: 0.17 mg/dL (ref 0.00–0.40)
Total Protein: 6.8 g/dL (ref 6.0–8.5)

## 2015-07-07 LAB — HEMOGLOBIN A1C
ESTIMATED AVERAGE GLUCOSE: 126 mg/dL
Hgb A1c MFr Bld: 6 % — ABNORMAL HIGH (ref 4.8–5.6)

## 2015-07-26 ENCOUNTER — Encounter: Payer: Self-pay | Admitting: Nurse Practitioner

## 2015-07-26 ENCOUNTER — Ambulatory Visit (INDEPENDENT_AMBULATORY_CARE_PROVIDER_SITE_OTHER): Payer: Commercial Managed Care - HMO | Admitting: Nurse Practitioner

## 2015-07-26 VITALS — BP 132/84 | Ht 66.5 in | Wt 192.0 lb

## 2015-07-26 DIAGNOSIS — Z0000 Encounter for general adult medical examination without abnormal findings: Secondary | ICD-10-CM

## 2015-07-26 DIAGNOSIS — M8580 Other specified disorders of bone density and structure, unspecified site: Secondary | ICD-10-CM

## 2015-07-26 DIAGNOSIS — M19042 Primary osteoarthritis, left hand: Secondary | ICD-10-CM | POA: Diagnosis not present

## 2015-07-26 DIAGNOSIS — R748 Abnormal levels of other serum enzymes: Secondary | ICD-10-CM

## 2015-07-26 DIAGNOSIS — R7303 Prediabetes: Secondary | ICD-10-CM | POA: Diagnosis not present

## 2015-07-26 DIAGNOSIS — Z79899 Other long term (current) drug therapy: Secondary | ICD-10-CM

## 2015-07-26 DIAGNOSIS — Z139 Encounter for screening, unspecified: Secondary | ICD-10-CM

## 2015-07-26 DIAGNOSIS — M19041 Primary osteoarthritis, right hand: Secondary | ICD-10-CM

## 2015-07-26 NOTE — Progress Notes (Addendum)
   Subjective:    Patient ID: Kayla [name], female    DOB: [birth year], 70 y.o.   MRN: [id number]  HPI presents for her wellness exam. Married, same sexual partner. History of hysterectomy with BSO. Regular vision and dental exams. Gets regular skin cancer screening. Regular activity. Healthy diet. Takes occasional vitamin D and calcium.     Review of Systems  Constitutional: Negative for fever, activity change, appetite change and fatigue.  HENT: Negative for dental problem, ear pain, sinus pressure and sore throat.   Respiratory: Negative for cough, chest tightness, shortness of breath and wheezing.   Cardiovascular: Negative for chest pain.  Gastrointestinal: Negative for nausea, vomiting, abdominal pain, diarrhea, constipation, blood in stool and abdominal distention.  Genitourinary: Negative for dysuria, urgency, frequency, vaginal discharge, enuresis, difficulty urinating, genital sores and pelvic pain.  Musculoskeletal: Positive for joint swelling and arthralgias.       Arthritis changes in fingers bilat. Stiff and sore especially in the mornings.        Objective:   Physical Exam  Constitutional: She is oriented to person, place, and time. She appears well-developed. No distress.  HENT:  Right Ear: External ear normal.  Left Ear: External ear normal.  Mouth/Throat: Oropharynx is clear and moist.  Neck: Normal range of motion. Neck supple. No tracheal deviation present. No thyromegaly present.  Cardiovascular: Normal rate, regular rhythm and normal heart sounds.  Exam reveals no gallop.   No murmur heard. Pulmonary/Chest: Effort normal and breath sounds normal.  Abdominal: Soft. She exhibits no distension. There is no tenderness.  Genitourinary:  Defers GU and rectal exams. Denies any problems. Colonoscopy less than a year ago.   Musculoskeletal: She exhibits no edema.  Nodules noted in finger joints bilat esp right hand. No erythema or warmth.   Lymphadenopathy:    She  has no cervical adenopathy.  Neurological: She is alert and oriented to person, place, and time.  Skin: Skin is warm and dry. No rash noted.  Psychiatric: She has a normal mood and affect. Her behavior is normal.  Breast exam: no nodularity or masses; axillae no adenopathy.  See labs 07/06/15. Reviewed with patient.     Assessment & Plan:   Problem List Items Addressed This Visit      Musculoskeletal and Integument   Osteopenia   Relevant Orders   DG Bone Density   Primary osteoarthritis of both hands     Other   Abnormal liver enzymes   Relevant Orders   Lipid panel   Prediabetes   Relevant Orders   Basic metabolic panel    Other Visit Diagnoses    Routine general medical examination at a health care facility    -  Primary    Relevant Orders    DG Bone Density    Screening        Relevant Orders    DG Bone Density    High risk medication use        Relevant Orders    Hepatic function panel      Repeat labs in 3 months. Drinks occasional wine. No tylenol use. Start daily omega 3 supplement for TG. Recommend she consider yearly mammogram based on family history of breast cancer.  Return in about 6 months (around 01/24/2016) for recheck. Call back sooner if any problems.

## 2015-07-26 NOTE — Patient Instructions (Signed)
Glucosamine 

## 2015-07-28 ENCOUNTER — Ambulatory Visit (HOSPITAL_COMMUNITY)
Admission: RE | Admit: 2015-07-28 | Discharge: 2015-07-28 | Disposition: A | Discharge status: Home / Self Care | Payer: Commercial Managed Care - HMO | Source: Ambulatory Visit | Attending: Family Medicine | Admitting: Family Medicine

## 2015-07-28 DIAGNOSIS — M8580 Other specified disorders of bone density and structure, unspecified site: Secondary | ICD-10-CM | POA: Diagnosis not present

## 2015-07-28 DIAGNOSIS — Z13820 Encounter for screening for osteoporosis: Secondary | ICD-10-CM | POA: Diagnosis not present

## 2015-07-28 DIAGNOSIS — Z0000 Encounter for general adult medical examination without abnormal findings: Secondary | ICD-10-CM | POA: Insufficient documentation

## 2015-07-28 DIAGNOSIS — Z780 Asymptomatic menopausal state: Secondary | ICD-10-CM | POA: Diagnosis not present

## 2015-10-06 DIAGNOSIS — Z79899 Other long term (current) drug therapy: Secondary | ICD-10-CM | POA: Diagnosis not present

## 2015-10-06 DIAGNOSIS — R748 Abnormal levels of other serum enzymes: Secondary | ICD-10-CM | POA: Diagnosis not present

## 2015-10-06 DIAGNOSIS — R7303 Prediabetes: Secondary | ICD-10-CM | POA: Diagnosis not present

## 2015-10-07 LAB — BASIC METABOLIC PANEL
BUN / CREAT RATIO: 14 (ref 11–26)
BUN: 15 mg/dL (ref 8–27)
CHLORIDE: 97 mmol/L (ref 96–106)
CO2: 27 mmol/L (ref 18–29)
Calcium: 9.7 mg/dL (ref 8.7–10.3)
Creatinine, Ser: 1.05 mg/dL — ABNORMAL HIGH (ref 0.57–1.00)
GFR calc Af Amer: 62 mL/min/{1.73_m2} (ref >59)
GFR calc non Af Amer: 54 mL/min/{1.73_m2} — ABNORMAL LOW (ref >59)
GLUCOSE: 108 mg/dL — AB (ref 65–99)
Potassium: 4.2 mmol/L (ref 3.5–5.2)
SODIUM: 141 mmol/L (ref 134–144)

## 2015-10-07 LAB — LIPID PANEL
CHOLESTEROL TOTAL: 190 mg/dL (ref 100–199)
Chol/HDL Ratio: 4.9 ratio units — ABNORMAL HIGH (ref 0.0–4.4)
HDL: 39 mg/dL — AB (ref >39)
LDL Calculated: 113 mg/dL — ABNORMAL HIGH (ref 0–99)
Triglycerides: 189 mg/dL — ABNORMAL HIGH (ref 0–149)
VLDL CHOLESTEROL CAL: 38 mg/dL (ref 5–40)

## 2015-10-07 LAB — HEPATIC FUNCTION PANEL
ALK PHOS: 65 IU/L (ref 39–117)
ALT: 57 IU/L — ABNORMAL HIGH (ref 0–32)
AST: 48 IU/L — ABNORMAL HIGH (ref 0–40)
Albumin: 4.7 g/dL (ref 3.5–4.8)
Bilirubin Total: 0.5 mg/dL (ref 0.0–1.2)
Bilirubin, Direct: 0.14 mg/dL (ref 0.00–0.40)
Total Protein: 7.3 g/dL (ref 6.0–8.5)

## 2016-01-02 ENCOUNTER — Other Ambulatory Visit: Payer: Self-pay | Admitting: Nurse Practitioner

## 2016-01-24 ENCOUNTER — Encounter: Payer: Self-pay | Admitting: Family Medicine

## 2016-01-24 ENCOUNTER — Ambulatory Visit (HOSPITAL_COMMUNITY)
Admission: RE | Admit: 2016-01-24 | Discharge: 2016-01-24 | Disposition: A | Discharge status: Home / Self Care | Payer: Commercial Managed Care - HMO | Source: Ambulatory Visit | Attending: Family Medicine | Admitting: Family Medicine

## 2016-01-24 ENCOUNTER — Ambulatory Visit (INDEPENDENT_AMBULATORY_CARE_PROVIDER_SITE_OTHER): Payer: Commercial Managed Care - HMO | Admitting: Family Medicine

## 2016-01-24 VITALS — BP 130/84 | Ht 66.5 in | Wt 191.0 lb

## 2016-01-24 DIAGNOSIS — M19041 Primary osteoarthritis, right hand: Secondary | ICD-10-CM

## 2016-01-24 DIAGNOSIS — M25562 Pain in left knee: Secondary | ICD-10-CM

## 2016-01-24 DIAGNOSIS — S8992XA Unspecified injury of left lower leg, initial encounter: Secondary | ICD-10-CM | POA: Diagnosis not present

## 2016-01-24 DIAGNOSIS — I10 Essential (primary) hypertension: Secondary | ICD-10-CM | POA: Diagnosis not present

## 2016-01-24 DIAGNOSIS — M19042 Primary osteoarthritis, left hand: Secondary | ICD-10-CM

## 2016-01-24 DIAGNOSIS — M1712 Unilateral primary osteoarthritis, left knee: Secondary | ICD-10-CM | POA: Insufficient documentation

## 2016-01-24 MED ORDER — HYDROCHLOROTHIAZIDE 25 MG PO TABS: 25.0000 mg | ORAL_TABLET | Freq: Every morning | ORAL | Status: DC

## 2016-01-24 MED ORDER — LISINOPRIL 20 MG PO TABS: 20.0000 mg | ORAL_TABLET | Freq: Every day | ORAL | Status: DC

## 2016-01-24 NOTE — Progress Notes (Signed)
   Subjective:    Patient ID: Kayla [name], female    DOB: 12-21-1945, 70 y.o.   MRN: [id number] Patient arrives office with several distinct concerns.  Hypertension This is a chronic problem. The current episode started more than 1 year ago. Risk factors for coronary artery disease include post-menopausal state. Treatments tried: lisinopril, hctz. There are no compliance problems.   Compliant with blood pressure medicine. No obvious side effects. Does not miss a dose.  Patient with left knee pain for 6 weeks- heard a loud popping and couldn't walk for 2 days, felt popping sensation, trouble putting pressure on it, oudnt put ressure on it for two days  Trouble with stairs and senso of giving awa  Pain in  Medial portion  Using diclofenac  Hands stiff in the morn with arthritis . Diclofenac has helped considerably. No obvious side effects with the. Able to use her hands more at this point.    Review of Systems No headache, no major weight loss or weight gain, no chest pain no back pain abdominal pain no change in bowel habits complete ROS otherwise negative     Objective:   Physical Exam  Alert vital stable blood pressure good on repeat. HEENT normal. Lungs clear. Heart regular rate and rhythm. Hands significant Heberden's nodes present. Left medial knee tender to palpation obvious effusion no obvious joint laxity good range of motion    Assessment & Plan:  Impression 1 hypertension good control meds reviewed to maintain same #2 arthritis bad enough for prescription meds overall much better on meds discussed maintain same #3 left knee pain after injury and popping sensation 6 weeks ago. The medial meniscus could be bone abnormality plan x-ray knee. Orthopedic referral. Diet exercise discussed. Medications refilled recheck in 6 months [facility]

## 2016-02-01 ENCOUNTER — Encounter: Payer: Self-pay | Admitting: Family Medicine

## 2016-02-07 ENCOUNTER — Other Ambulatory Visit (HOSPITAL_COMMUNITY): Payer: Self-pay | Admitting: Orthopedic Surgery

## 2016-02-07 DIAGNOSIS — M2392 Unspecified internal derangement of left knee: Secondary | ICD-10-CM

## 2016-02-17 ENCOUNTER — Ambulatory Visit (HOSPITAL_COMMUNITY)
Admission: RE | Admit: 2016-02-17 | Discharge: 2016-02-17 | Disposition: A | Discharge status: Home / Self Care | Payer: Commercial Managed Care - HMO | Source: Ambulatory Visit | Attending: Orthopedic Surgery | Admitting: Orthopedic Surgery

## 2016-02-17 DIAGNOSIS — S86812A Strain of other muscle(s) and tendon(s) at lower leg level, left leg, initial encounter: Secondary | ICD-10-CM | POA: Diagnosis not present

## 2016-02-17 DIAGNOSIS — M659 Synovitis and tenosynovitis, unspecified: Secondary | ICD-10-CM | POA: Diagnosis not present

## 2016-02-17 DIAGNOSIS — S83242A Other tear of medial meniscus, current injury, left knee, initial encounter: Secondary | ICD-10-CM | POA: Insufficient documentation

## 2016-02-17 DIAGNOSIS — X58XXXA Exposure to other specified factors, initial encounter: Secondary | ICD-10-CM | POA: Diagnosis not present

## 2016-02-17 DIAGNOSIS — M1712 Unilateral primary osteoarthritis, left knee: Secondary | ICD-10-CM | POA: Diagnosis not present

## 2016-02-17 DIAGNOSIS — M2392 Unspecified internal derangement of left knee: Secondary | ICD-10-CM

## 2016-02-17 DIAGNOSIS — M25462 Effusion, left knee: Secondary | ICD-10-CM | POA: Diagnosis not present

## 2016-02-17 DIAGNOSIS — M25562 Pain in left knee: Secondary | ICD-10-CM | POA: Diagnosis not present

## 2016-02-21 DIAGNOSIS — S83242D Other tear of medial meniscus, current injury, left knee, subsequent encounter: Secondary | ICD-10-CM | POA: Diagnosis not present

## 2016-02-21 DIAGNOSIS — M1712 Unilateral primary osteoarthritis, left knee: Secondary | ICD-10-CM | POA: Diagnosis not present

## 2016-02-21 DIAGNOSIS — M2392 Unspecified internal derangement of left knee: Secondary | ICD-10-CM | POA: Diagnosis not present

## 2016-03-06 ENCOUNTER — Other Ambulatory Visit: Payer: Self-pay | Admitting: *Deleted

## 2016-03-06 MED ORDER — HYDROCHLOROTHIAZIDE 25 MG PO TABS: 25.0000 mg | ORAL_TABLET | Freq: Every morning | ORAL | 1 refills | Status: DC

## 2016-03-06 MED ORDER — LISINOPRIL 20 MG PO TABS: 20.0000 mg | ORAL_TABLET | Freq: Every day | ORAL | 1 refills | Status: DC

## 2016-03-13 DIAGNOSIS — M1712 Unilateral primary osteoarthritis, left knee: Secondary | ICD-10-CM | POA: Diagnosis not present

## 2016-03-13 DIAGNOSIS — S83242D Other tear of medial meniscus, current injury, left knee, subsequent encounter: Secondary | ICD-10-CM | POA: Diagnosis not present

## 2016-03-20 DIAGNOSIS — M1712 Unilateral primary osteoarthritis, left knee: Secondary | ICD-10-CM | POA: Diagnosis not present

## 2016-03-26 DIAGNOSIS — M1712 Unilateral primary osteoarthritis, left knee: Secondary | ICD-10-CM | POA: Diagnosis not present

## 2016-04-03 DIAGNOSIS — M1712 Unilateral primary osteoarthritis, left knee: Secondary | ICD-10-CM | POA: Diagnosis not present

## 2016-05-14 ENCOUNTER — Other Ambulatory Visit: Payer: Self-pay | Admitting: Family Medicine

## 2016-05-14 DIAGNOSIS — Z1231 Encounter for screening mammogram for malignant neoplasm of breast: Secondary | ICD-10-CM

## 2016-05-17 ENCOUNTER — Ambulatory Visit (HOSPITAL_COMMUNITY)
Admission: RE | Admit: 2016-05-17 | Discharge: 2016-05-17 | Disposition: A | Discharge status: Home / Self Care | Payer: Commercial Managed Care - HMO | Source: Ambulatory Visit | Attending: Family Medicine | Admitting: Family Medicine

## 2016-05-17 ENCOUNTER — Other Ambulatory Visit: Payer: Self-pay | Admitting: Family Medicine

## 2016-05-17 DIAGNOSIS — Z1231 Encounter for screening mammogram for malignant neoplasm of breast: Secondary | ICD-10-CM | POA: Insufficient documentation

## 2016-05-21 ENCOUNTER — Other Ambulatory Visit: Payer: Self-pay | Admitting: Family Medicine

## 2016-05-21 DIAGNOSIS — R928 Other abnormal and inconclusive findings on diagnostic imaging of breast: Secondary | ICD-10-CM

## 2016-05-22 DIAGNOSIS — M1712 Unilateral primary osteoarthritis, left knee: Secondary | ICD-10-CM | POA: Diagnosis not present

## 2016-05-31 ENCOUNTER — Other Ambulatory Visit: Payer: Self-pay | Admitting: Family Medicine

## 2016-05-31 DIAGNOSIS — R928 Other abnormal and inconclusive findings on diagnostic imaging of breast: Secondary | ICD-10-CM

## 2016-06-05 ENCOUNTER — Telehealth: Payer: Self-pay | Admitting: Family Medicine

## 2016-06-05 ENCOUNTER — Ambulatory Visit (HOSPITAL_COMMUNITY)
Admission: RE | Admit: 2016-06-05 | Discharge: 2016-06-05 | Disposition: A | Discharge status: Home / Self Care | Payer: Commercial Managed Care - HMO | Source: Ambulatory Visit | Attending: Family Medicine | Admitting: Family Medicine

## 2016-06-05 DIAGNOSIS — R921 Mammographic calcification found on diagnostic imaging of breast: Secondary | ICD-10-CM | POA: Diagnosis not present

## 2016-06-05 DIAGNOSIS — R928 Other abnormal and inconclusive findings on diagnostic imaging of breast: Secondary | ICD-10-CM | POA: Diagnosis not present

## 2016-06-05 NOTE — Telephone Encounter (Signed)
errorr

## 2016-06-11 ENCOUNTER — Telehealth: Payer: Self-pay | Admitting: Family Medicine

## 2016-06-11 NOTE — Telephone Encounter (Signed)
Lets do, though t I msy have signed a special requisistion

## 2016-06-11 NOTE — Telephone Encounter (Signed)
Patient states she needs referral for mammogram at breast center in [facility] for her insurance to pay. Breast Center no: [phone]

## 2016-06-11 NOTE — Telephone Encounter (Signed)
Spoke with patient and informed her that we received the form from the [facility] on last week and Dr.Steve Luking signed the form as the referring provider. Patient verbalized understanding.

## 2016-06-12 ENCOUNTER — Other Ambulatory Visit: Payer: Self-pay | Admitting: Family Medicine

## 2016-06-12 DIAGNOSIS — R921 Mammographic calcification found on diagnostic imaging of breast: Secondary | ICD-10-CM

## 2016-06-18 ENCOUNTER — Ambulatory Visit
Admission: RE | Admit: 2016-06-18 | Discharge: 2016-06-18 | Disposition: A | Discharge status: Home / Self Care | Payer: Commercial Managed Care - HMO | Source: Ambulatory Visit | Attending: Family Medicine | Admitting: Family Medicine

## 2016-06-18 DIAGNOSIS — R921 Mammographic calcification found on diagnostic imaging of breast: Secondary | ICD-10-CM

## 2016-06-18 DIAGNOSIS — D241 Benign neoplasm of right breast: Secondary | ICD-10-CM | POA: Diagnosis not present

## 2016-07-26 ENCOUNTER — Ambulatory Visit (INDEPENDENT_AMBULATORY_CARE_PROVIDER_SITE_OTHER): Payer: Commercial Managed Care - HMO | Admitting: Nurse Practitioner

## 2016-07-26 VITALS — BP 128/78 | Temp 98.1°F | Ht 66.5 in | Wt 190.2 lb

## 2016-07-26 DIAGNOSIS — R7303 Prediabetes: Secondary | ICD-10-CM | POA: Diagnosis not present

## 2016-07-26 DIAGNOSIS — Z0000 Encounter for general adult medical examination without abnormal findings: Secondary | ICD-10-CM

## 2016-07-26 DIAGNOSIS — J329 Chronic sinusitis, unspecified: Secondary | ICD-10-CM

## 2016-07-26 DIAGNOSIS — I10 Essential (primary) hypertension: Secondary | ICD-10-CM | POA: Diagnosis not present

## 2016-07-26 DIAGNOSIS — M19042 Primary osteoarthritis, left hand: Secondary | ICD-10-CM | POA: Diagnosis not present

## 2016-07-26 DIAGNOSIS — M19041 Primary osteoarthritis, right hand: Secondary | ICD-10-CM

## 2016-07-26 DIAGNOSIS — J310 Chronic rhinitis: Secondary | ICD-10-CM

## 2016-07-26 DIAGNOSIS — R748 Abnormal levels of other serum enzymes: Secondary | ICD-10-CM | POA: Diagnosis not present

## 2016-07-26 MED ORDER — AMOXICILLIN-POT CLAVULANATE 875-125 MG PO TABS: 1.0000 | ORAL_TABLET | Freq: Two times a day (BID) | ORAL | 0 refills | Status: DC

## 2016-07-26 MED ORDER — MELOXICAM 7.5 MG PO TABS: 7.5000 mg | ORAL_TABLET | Freq: Every day | ORAL | 0 refills | Status: DC

## 2016-07-26 NOTE — Patient Instructions (Signed)
Claritin or allegra once a day Nasacort or Rhinocort as directed

## 2016-07-27 ENCOUNTER — Encounter: Payer: Self-pay | Admitting: Nurse Practitioner

## 2016-07-27 LAB — HEPATIC FUNCTION PANEL
ALT: 39 IU/L — AB (ref 0–32)
AST: 29 IU/L (ref 0–40)
Albumin: 4.9 g/dL — ABNORMAL HIGH (ref 3.5–4.8)
Alkaline Phosphatase: 68 IU/L (ref 39–117)
BILIRUBIN TOTAL: 0.6 mg/dL (ref 0.0–1.2)
Bilirubin, Direct: 0.17 mg/dL (ref 0.00–0.40)
Total Protein: 6.8 g/dL (ref 6.0–8.5)

## 2016-07-27 LAB — BASIC METABOLIC PANEL
BUN/Creatinine Ratio: 17 (ref 12–28)
BUN: 16 mg/dL (ref 8–27)
CALCIUM: 9.7 mg/dL (ref 8.7–10.3)
CO2: 29 mmol/L (ref 18–29)
Chloride: 98 mmol/L (ref 96–106)
Creatinine, Ser: 0.96 mg/dL (ref 0.57–1.00)
GFR, EST AFRICAN AMERICAN: 68 mL/min/{1.73_m2} (ref >59)
GFR, EST NON AFRICAN AMERICAN: 59 mL/min/{1.73_m2} — AB (ref >59)
Glucose: 102 mg/dL — ABNORMAL HIGH (ref 65–99)
POTASSIUM: 4.2 mmol/L (ref 3.5–5.2)
Sodium: 141 mmol/L (ref 134–144)

## 2016-07-27 LAB — LIPID PANEL
Chol/HDL Ratio: 4.3 ratio (ref 0.0–4.4)
Cholesterol, Total: 200 mg/dL — ABNORMAL HIGH (ref 100–199)
HDL: 46 mg/dL (ref >39)
LDL Calculated: 100 mg/dL — ABNORMAL HIGH (ref 0–99)
Triglycerides: 269 mg/dL — ABNORMAL HIGH (ref 0–149)
VLDL Cholesterol Cal: 54 mg/dL — ABNORMAL HIGH (ref 5–40)

## 2016-07-27 LAB — HEMOGLOBIN A1C
Est. average glucose Bld gHb Est-mCnc: 117 mg/dL
Hgb A1c MFr Bld: 5.7 % — ABNORMAL HIGH (ref 4.8–5.6)

## 2016-07-27 LAB — HEPATITIS C ANTIBODY: Hep C Virus Ab: <0.1 {s_co_ratio} (ref 0.0–0.9)

## 2016-07-27 NOTE — Progress Notes (Signed)
Subjective:    Patient ID: Kayla [name], female    DOB: 28-Jul-1946, 70 y.o.   MRN: [id number]  HPI presents for her wellness exam. Has had a hysterectomy. Same sexual partner. No pelvic pain. Regular vision and dental care. Continues to have off-and-on pain especially in the hands from her osteoarthritis. Diclofenac works but cannot take it twice a day due to increased reflux. Active lifestyle. Has been having sinus symptoms for the past 2 months mainly in the maxillary area. Worse in the morning. Head congestion. Postnasal drainage. Occasional nonproductive cough. No sore throat. Some ear pain. No documented fever.    Review of Systems  Constitutional: Negative for activity change, appetite change, fatigue and fever.  HENT: Positive for ear pain, postnasal drip, sinus pain and sinus pressure. Negative for dental problem and sore throat.   Respiratory: Positive for cough. Negative for chest tightness, shortness of breath and wheezing.   Cardiovascular: Negative for chest pain.  Gastrointestinal: Negative for abdominal distention, abdominal pain, blood in stool, constipation, diarrhea, nausea and vomiting.  Genitourinary: Negative for difficulty urinating, dysuria, enuresis, frequency, genital sores, pelvic pain, urgency and vaginal discharge.  Musculoskeletal: Positive for arthralgias.       Objective:   Physical Exam  Constitutional: She is oriented to person, place, and time. She appears well-developed. No distress.  HENT:  Right Ear: External ear normal.  Left Ear: External ear normal.  TMs clear effusion, no erythema. Pharynx injected with PND noted. Neck supple with mild soft anterior adenopathy.   Neck: Normal range of motion. Neck supple. No tracheal deviation present. No thyromegaly present.  Cardiovascular: Normal rate, regular rhythm and normal heart sounds.  Exam reveals no gallop.   No murmur heard. Pulmonary/Chest: Effort normal and breath sounds normal.  Abdominal:  Soft. She exhibits no distension. There is no tenderness.  Genitourinary: Vagina normal. No vaginal discharge found.  Genitourinary Comments: External GU no rashes or lesions. Vagina no discharge. Bimanual exam no tenderness or masses. Rectal exam no masses, no stool for Hemoccult.  Musculoskeletal: She exhibits no edema.  Significant osteoarthritic changes noted in the hands including Heberden's nodes.  Neurological: She is alert and oriented to person, place, and time.  Skin: Skin is warm and dry. No rash noted.  Psychiatric: She has a normal mood and affect. Her behavior is normal.  Vitals reviewed.  Breast exam: No masses, axillae no adenopathy. Weight is essentially unchanged over the past year.       Assessment & Plan:   Problem List Items Addressed This Visit      Cardiovascular and Mediastinum   Hypertension   Relevant Orders   Basic Metabolic Panel (BMET) (Completed)   Lipid Profile (Completed)     Musculoskeletal and Integument   Primary osteoarthritis of both hands   Relevant Medications   meloxicam (MOBIC) 7.5 MG tablet     Other   Abnormal liver enzymes   Relevant Orders   Hepatitis C Antibody (Completed)   Hepatic function panel (Completed)   Prediabetes   Relevant Orders   HgB A1c (Completed)    Other Visit Diagnoses    Routine general medical examination at a health care facility    -  Primary   Rhinosinusitis       Relevant Medications   amoxicillin-clavulanate (AUGMENTIN) 875-125 MG tablet     Meds ordered this encounter  Medications  . meloxicam (MOBIC) 7.5 MG tablet    Sig: Take 1 tablet (7.5 mg total) by mouth daily.  Dispense:  30 tablet    Refill:  0    Order Specific Question:   Supervising Provider    Answer:   Mikey Kirschner [[id number]]  . amoxicillin-clavulanate (AUGMENTIN) 875-125 MG tablet    Sig: Take 1 tablet by mouth 2 (two) times daily.    Dispense:  20 tablet    Refill:  0    Order Specific Question:   Supervising Provider     Answer:   Mikey Kirschner [[id number]]   Hold on diclofenac. Trial of meloxicam, take with food. DC if any stomach upset. Encouraged regular activity and healthy diet. Daily vitamin D and calcium supplementation. OTC meds as directed for congestion and cough. Call back if worsens or persists. Return in about 6 months (around 01/24/2017) for recheck.

## 2016-07-31 ENCOUNTER — Other Ambulatory Visit (INDEPENDENT_AMBULATORY_CARE_PROVIDER_SITE_OTHER): Payer: Commercial Managed Care - HMO | Admitting: *Deleted

## 2016-07-31 DIAGNOSIS — Z1211 Encounter for screening for malignant neoplasm of colon: Secondary | ICD-10-CM | POA: Diagnosis not present

## 2016-07-31 LAB — POC HEMOCCULT BLD/STL (HOME/3-CARD/SCREEN)
Card #3 Fecal Occult Blood, POC: NEGATIVE
FECAL OCCULT BLD: NEGATIVE
FECAL OCCULT BLD: NEGATIVE

## 2016-08-03 ENCOUNTER — Telehealth: Payer: Self-pay | Admitting: *Deleted

## 2016-08-03 ENCOUNTER — Other Ambulatory Visit: Payer: Self-pay | Admitting: Nurse Practitioner

## 2016-08-03 MED ORDER — CEFDINIR 300 MG PO CAPS: 300.0000 mg | ORAL_CAPSULE | Freq: Two times a day (BID) | ORAL | 0 refills | Status: DC

## 2016-08-03 NOTE — Telephone Encounter (Signed)
I sent in another antibiotic. The question is whether this was blood or possibly an ingredient/dye in the med. Let us know if she sees further evidence of blood.

## 2016-08-03 NOTE — Telephone Encounter (Signed)
Patient stated she was given augmentin for sinus infection but noticed blood in her urine when she took it so she stopped and had no more blood in urine. Patient states she still has a bad sinus infection and wonders what she should do.

## 2016-08-03 NOTE — Telephone Encounter (Signed)
Discussed with pt. Pt verbalized understanding.  °

## 2016-08-27 ENCOUNTER — Other Ambulatory Visit: Payer: Self-pay | Admitting: *Deleted

## 2016-08-27 MED ORDER — MELOXICAM 7.5 MG PO TABS
7.5000 mg | ORAL_TABLET | Freq: Every day | ORAL | 0 refills | Status: DC
Start: 2016-08-27 — End: 2017-01-25

## 2016-08-29 ENCOUNTER — Other Ambulatory Visit: Payer: Self-pay | Admitting: Nurse Practitioner

## 2016-09-07 ENCOUNTER — Other Ambulatory Visit: Payer: Self-pay | Admitting: Family Medicine

## 2016-09-21 ENCOUNTER — Other Ambulatory Visit: Payer: Self-pay | Admitting: *Deleted

## 2017-01-24 ENCOUNTER — Ambulatory Visit: Payer: Commercial Managed Care - HMO | Admitting: Nurse Practitioner

## 2017-01-25 ENCOUNTER — Ambulatory Visit (INDEPENDENT_AMBULATORY_CARE_PROVIDER_SITE_OTHER): Payer: Medicare HMO | Admitting: Nurse Practitioner

## 2017-01-25 ENCOUNTER — Encounter: Payer: Self-pay | Admitting: Nurse Practitioner

## 2017-01-25 VITALS — BP 112/74 | Ht 66.5 in | Wt 194.0 lb

## 2017-01-25 DIAGNOSIS — I10 Essential (primary) hypertension: Secondary | ICD-10-CM

## 2017-01-25 DIAGNOSIS — R748 Abnormal levels of other serum enzymes: Secondary | ICD-10-CM

## 2017-01-25 DIAGNOSIS — E781 Pure hyperglyceridemia: Secondary | ICD-10-CM

## 2017-01-25 MED ORDER — HYDROCHLOROTHIAZIDE 25 MG PO TABS: 25.0000 mg | ORAL_TABLET | Freq: Every morning | ORAL | 1 refills | Status: DC

## 2017-01-25 MED ORDER — LISINOPRIL 20 MG PO TABS: 20.0000 mg | ORAL_TABLET | Freq: Every day | ORAL | 1 refills | Status: DC

## 2017-01-25 MED ORDER — MELOXICAM 7.5 MG PO TABS: 7.5000 mg | ORAL_TABLET | Freq: Every day | ORAL | 5 refills | Status: DC

## 2017-01-25 NOTE — Progress Notes (Signed)
Subjective:  Presents for recheck on her hypertension. Currently on lisinopril 20 mg daily but has noticed increased fatigue at times and wondering if her blood pressure is dropping. Has been taking plain Claritin for her sinus symptoms. No chest pain/ischemic type pain or shortness of breath. Is due for her repeat cholesterol and liver check. Has followed a low-fat diet and eating oatmeal every day to try to help her triglycerides. Trying to stay active.  Objective:   BP 112/74   Ht 5' 6.5" (1.689 m)   Wt 194 lb 0.2 oz (88 kg)   BMI 30.85 kg/m  NAD. Alert, oriented. Lungs clear. Heart regular rate rhythm. Lower extremities no edema.  Assessment:   Problem List Items Addressed This Visit      Cardiovascular and Mediastinum   Hypertension - Primary   Relevant Medications   hydrochlorothiazide (HYDRODIURIL) 25 MG tablet   lisinopril (PRINIVIL,ZESTRIL) 20 MG tablet     Other   Abnormal liver enzymes   Relevant Orders   Hepatic function panel   Hypertriglyceridemia   Relevant Medications   hydrochlorothiazide (HYDRODIURIL) 25 MG tablet   lisinopril (PRINIVIL,ZESTRIL) 20 MG tablet   Other Relevant Orders   Lipid panel        Plan:   Meds ordered this encounter  Medications  . hydrochlorothiazide (HYDRODIURIL) 25 MG tablet    Sig: Take 1 tablet (25 mg total) by mouth every morning.    Dispense:  90 tablet    Refill:  1  . lisinopril (PRINIVIL,ZESTRIL) 20 MG tablet    Sig: Take 1 tablet (20 mg total) by mouth daily.    Dispense:  90 tablet    Refill:  1  . meloxicam (MOBIC) 7.5 MG tablet    Sig: Take 1 tablet (7.5 mg total) by mouth daily.    Dispense:  30 tablet    Refill:  5   Reduce lisinopril dose to half tab or 10 mg daily. Monitor BP outside the office, contact us if it remains above 130/80. Lab work pending. Return in about 6 months (around 07/27/2017) for physical.

## 2017-01-25 NOTE — Patient Instructions (Signed)
Cut Lisinopril in 1/2 (10 mg)

## 2017-01-26 LAB — HEPATIC FUNCTION PANEL
ALBUMIN: 4.7 g/dL (ref 3.5–4.8)
ALK PHOS: 66 IU/L (ref 39–117)
ALT: 29 IU/L (ref 0–32)
AST: 25 IU/L (ref 0–40)
BILIRUBIN, DIRECT: 0.15 mg/dL (ref 0.00–0.40)
Bilirubin Total: 0.6 mg/dL (ref 0.0–1.2)
TOTAL PROTEIN: 6.9 g/dL (ref 6.0–8.5)

## 2017-01-26 LAB — LIPID PANEL
Chol/HDL Ratio: 4.3 ratio (ref 0.0–4.4)
Cholesterol, Total: 212 mg/dL — ABNORMAL HIGH (ref 100–199)
HDL: 49 mg/dL (ref >39)
LDL CALC: 113 mg/dL — AB (ref 0–99)
TRIGLYCERIDES: 250 mg/dL — AB (ref 0–149)
VLDL CHOLESTEROL CAL: 50 mg/dL — AB (ref 5–40)

## 2017-07-26 ENCOUNTER — Ambulatory Visit (INDEPENDENT_AMBULATORY_CARE_PROVIDER_SITE_OTHER): Payer: Medicare HMO | Admitting: Nurse Practitioner

## 2017-07-26 ENCOUNTER — Encounter: Payer: Self-pay | Admitting: Nurse Practitioner

## 2017-07-26 VITALS — BP 128/80 | Ht 66.5 in | Wt 191.8 lb

## 2017-07-26 DIAGNOSIS — I10 Essential (primary) hypertension: Secondary | ICD-10-CM

## 2017-07-26 DIAGNOSIS — R748 Abnormal levels of other serum enzymes: Secondary | ICD-10-CM | POA: Diagnosis not present

## 2017-07-26 DIAGNOSIS — R350 Frequency of micturition: Secondary | ICD-10-CM

## 2017-07-26 DIAGNOSIS — R300 Dysuria: Secondary | ICD-10-CM

## 2017-07-26 DIAGNOSIS — E781 Pure hyperglyceridemia: Secondary | ICD-10-CM | POA: Diagnosis not present

## 2017-07-26 DIAGNOSIS — Z0000 Encounter for general adult medical examination without abnormal findings: Secondary | ICD-10-CM | POA: Diagnosis not present

## 2017-07-26 DIAGNOSIS — Z780 Asymptomatic menopausal state: Secondary | ICD-10-CM | POA: Diagnosis not present

## 2017-07-26 DIAGNOSIS — R7303 Prediabetes: Secondary | ICD-10-CM

## 2017-07-26 DIAGNOSIS — Z0001 Encounter for general adult medical examination with abnormal findings: Secondary | ICD-10-CM | POA: Diagnosis not present

## 2017-07-26 DIAGNOSIS — M8580 Other specified disorders of bone density and structure, unspecified site: Secondary | ICD-10-CM

## 2017-07-26 LAB — POCT URINALYSIS DIPSTICK
Blood, UA: 50
PROTEIN UA: 30
Spec Grav, UA: >=1.03 — AB (ref 1.010–1.025)
pH, UA: 5 (ref 5.0–8.0)

## 2017-07-26 MED ORDER — NITROFURANTOIN MONOHYD MACRO 100 MG PO CAPS: 100.0000 mg | ORAL_CAPSULE | Freq: Two times a day (BID) | ORAL | 0 refills | Status: DC

## 2017-07-26 NOTE — Progress Notes (Signed)
Subjective:    Patient ID: Kayla [name], female    DOB: [birth year], 71 y.o.   MRN: [id number]  HPI AWV- Annual Wellness Visit  The patient was seen for their annual wellness visit. The patient's past medical history, surgical history, and family history were reviewed. Pertinent vaccines were reviewed ( tetanus, pneumonia, shingles, flu) The patient's medication list was reviewed and updated.  The height and weight were entered. The patient's current BMI is: 30.45  Cognitive screening was completed. Outcome of Mini - Cog: pass  Falls within the past 6 months: nonre  Current tobacco usage: none (All patients who use tobacco were given written and verbal information on quitting)  Recent listing of emergency department/hospitalizations over the past year were reviewed.  current specialist the patient sees on a regular basis: none   Medicare annual wellness visit patient questionnaire was reviewed.  A written screening schedule for the patient for the next 5-10 years was given. Appropriate discussion of followup regarding next visit was discussed.  Presents for her wellness exam. Has had a total hysterectomy. No new sexual partners. No pelvic pain. Has had urinary frequency for the past few months. Some urgency. No significant incontinence. Regular vision and dental exams. Active lifestyle.      Review of Systems  Constitutional: Negative for activity change, appetite change, fatigue and fever.  HENT: Negative for dental problem, ear pain, sinus pressure and sore throat.   Respiratory: Negative for cough, chest tightness, shortness of breath and wheezing.   Cardiovascular: Negative for chest pain.  Gastrointestinal: Negative for abdominal distention, abdominal pain, blood in stool, constipation, diarrhea, nausea and vomiting.  Genitourinary: Positive for dysuria, frequency and urgency. Negative for difficulty urinating, enuresis, genital sores, pelvic pain and vaginal  discharge.   Depression screen [facility] 2/9 07/26/2016 07/26/2015  Decreased Interest 0 0  Down, Depressed, Hopeless 0 0  PHQ - 2 Score 0 0        Objective:   Physical Exam  Constitutional: She is oriented to person, place, and time. She appears well-developed. No distress.  HENT:  Right Ear: External ear normal.  Left Ear: External ear normal.  Mouth/Throat: Oropharynx is clear and moist.  Neck: Normal range of motion. Neck supple. No tracheal deviation present. No thyromegaly present.  Cardiovascular: Normal rate, regular rhythm and normal heart sounds. Exam reveals no gallop.  No murmur heard. Pulmonary/Chest: Effort normal and breath sounds normal. Right breast exhibits no inverted nipple, no mass, no skin change and no tenderness. Left breast exhibits no inverted nipple, no mass, no skin change and no tenderness. Breasts are symmetrical.  Abdominal: Soft. She exhibits no distension. There is no tenderness.  Genitourinary: Vagina normal. No vaginal discharge found.  Genitourinary Comments: External GU: no rash or lesions. Vagina: pale and dry; no discharge. Bimanual exam: no tenderness or obvious masses.   Musculoskeletal: She exhibits no edema.  Lymphadenopathy:    She has no cervical adenopathy.  Neurological: She is alert and oriented to person, place, and time.  Skin: Skin is warm and dry.  Multiple keratoses and skin tags. Sun damage noted.   Psychiatric: She has a normal mood and affect. Her behavior is normal.  Vitals reviewed.  Results for orders placed or performed in visit on 07/26/17  POCT urinalysis dipstick  Result Value Ref Range   Color, UA     Clarity, UA     Glucose, UA     Bilirubin, UA ++    Ketones, UA     Spec Grav, UA >=1.030 (A) 1.010 - 1.025   Blood, UA 50    pH, UA  5.0 5.0 - 8.0   Protein, UA 30    Urobilinogen, UA  0.2 or 1.0 E.U./dL   Nitrite, UA     Leukocytes, UA Small (1+) (A) Negative   Appearance     Odor      Urine micro: neg      Assessment & Plan:   Problem List Items Addressed This Visit      Cardiovascular and Mediastinum   Hypertension   Relevant Orders   Basic metabolic panel (Completed)     Musculoskeletal and Integument   Osteopenia   Relevant Orders   DG Bone Density     Other   Abnormal liver enzymes   Relevant Orders   Hepatic function panel (Completed)   Hypertriglyceridemia   Relevant Orders   Lipid panel (Completed)   Prediabetes   Relevant Orders   Hemoglobin A1c (Completed)    Other Visit Diagnoses    Routine general medical examination at a health care facility    -  Primary   Relevant Orders   Lipid panel (Completed)   Hepatic function panel (Completed)   Basic metabolic panel (Completed)   Hemoglobin A1c (Completed)   POCT urinalysis dipstick (Completed)   DG Bone Density   Dysuria       Relevant Orders   POCT urinalysis dipstick (Completed)   Urine Culture   Urinary frequency       Relevant Orders   POCT urinalysis dipstick (Completed)   Urine Culture   Post-menopausal         Meds ordered this encounter  Medications  . nitrofurantoin, macrocrystal-monohydrate, (MACROBID) 100 MG capsule    Sig: Take 1 capsule (100 mg total) by mouth 2 (two) times daily.    Dispense:  14 capsule    Refill:  0    Order Specific Question:   Supervising Provider    Answer:   Mikey Kirschner [[id number]]   Urine culture pending. Warning signs reviewed. Call back next week if no improvement, sooner if worse.  Recommend mammogram based on family history but patient defers. Also recommend she consider another skin cancer screening.  Declines flu vaccine.  Recommend daily vitamin D and calcium supplementation. DEXA pending. Return in about 6 months (around 01/24/2018) for recheck.

## 2017-07-27 ENCOUNTER — Encounter: Payer: Self-pay | Admitting: Nurse Practitioner

## 2017-07-27 LAB — LIPID PANEL
CHOLESTEROL TOTAL: 194 mg/dL (ref 100–199)
Chol/HDL Ratio: 3.9 ratio (ref 0.0–4.4)
HDL: 50 mg/dL (ref >39)
LDL Calculated: 100 mg/dL — ABNORMAL HIGH (ref 0–99)
Triglycerides: 222 mg/dL — ABNORMAL HIGH (ref 0–149)
VLDL Cholesterol Cal: 44 mg/dL — ABNORMAL HIGH (ref 5–40)

## 2017-07-27 LAB — BASIC METABOLIC PANEL
BUN / CREAT RATIO: 22 (ref 12–28)
BUN: 20 mg/dL (ref 8–27)
CO2: 25 mmol/L (ref 20–29)
CREATININE: 0.92 mg/dL (ref 0.57–1.00)
Calcium: 9.5 mg/dL (ref 8.7–10.3)
Chloride: 101 mmol/L (ref 96–106)
GFR, EST AFRICAN AMERICAN: 71 mL/min/{1.73_m2} (ref >59)
GFR, EST NON AFRICAN AMERICAN: 62 mL/min/{1.73_m2} (ref >59)
Glucose: 108 mg/dL — ABNORMAL HIGH (ref 65–99)
Potassium: 4.6 mmol/L (ref 3.5–5.2)
SODIUM: 142 mmol/L (ref 134–144)

## 2017-07-27 LAB — HEMOGLOBIN A1C
Est. average glucose Bld gHb Est-mCnc: 126 mg/dL
Hgb A1c MFr Bld: 6 % — ABNORMAL HIGH (ref 4.8–5.6)

## 2017-07-27 LAB — HEPATIC FUNCTION PANEL
ALBUMIN: 4.7 g/dL (ref 3.5–4.8)
ALK PHOS: 62 IU/L (ref 39–117)
ALT: 38 IU/L — ABNORMAL HIGH (ref 0–32)
AST: 31 IU/L (ref 0–40)
BILIRUBIN, DIRECT: 0.12 mg/dL (ref 0.00–0.40)
Bilirubin Total: 0.5 mg/dL (ref 0.0–1.2)
TOTAL PROTEIN: 6.7 g/dL (ref 6.0–8.5)

## 2017-07-28 LAB — URINE CULTURE

## 2017-07-29 ENCOUNTER — Encounter: Payer: Medicare HMO | Admitting: Nurse Practitioner

## 2017-08-01 ENCOUNTER — Ambulatory Visit (HOSPITAL_COMMUNITY)
Admission: RE | Admit: 2017-08-01 | Discharge: 2017-08-01 | Disposition: A | Discharge status: Home / Self Care | Payer: Medicare HMO | Source: Ambulatory Visit | Attending: Nurse Practitioner | Admitting: Nurse Practitioner

## 2017-08-01 DIAGNOSIS — M85851 Other specified disorders of bone density and structure, right thigh: Secondary | ICD-10-CM | POA: Diagnosis not present

## 2017-08-01 DIAGNOSIS — M8580 Other specified disorders of bone density and structure, unspecified site: Secondary | ICD-10-CM | POA: Insufficient documentation

## 2017-08-01 DIAGNOSIS — Z0000 Encounter for general adult medical examination without abnormal findings: Secondary | ICD-10-CM | POA: Insufficient documentation

## 2017-08-01 DIAGNOSIS — Z780 Asymptomatic menopausal state: Secondary | ICD-10-CM | POA: Diagnosis not present

## 2017-10-06 ENCOUNTER — Other Ambulatory Visit: Payer: Self-pay | Admitting: Nurse Practitioner

## 2018-01-24 ENCOUNTER — Ambulatory Visit: Payer: Medicare HMO | Admitting: Nurse Practitioner

## 2018-01-31 ENCOUNTER — Ambulatory Visit: Payer: Medicare HMO | Admitting: Nurse Practitioner

## 2018-01-31 ENCOUNTER — Encounter: Payer: Self-pay | Admitting: Nurse Practitioner

## 2018-01-31 VITALS — BP 120/82 | Ht 66.5 in | Wt 192.0 lb

## 2018-01-31 DIAGNOSIS — Z1283 Encounter for screening for malignant neoplasm of skin: Secondary | ICD-10-CM

## 2018-01-31 DIAGNOSIS — I10 Essential (primary) hypertension: Secondary | ICD-10-CM

## 2018-01-31 MED ORDER — LISINOPRIL 20 MG PO TABS: 20.0000 mg | ORAL_TABLET | Freq: Every day | ORAL | 1 refills | Status: DC

## 2018-01-31 MED ORDER — HYDROCHLOROTHIAZIDE 25 MG PO TABS
25.0000 mg | ORAL_TABLET | Freq: Every morning | ORAL | 1 refills | Status: DC
Start: 2018-01-31 — End: 2018-08-04

## 2018-01-31 NOTE — Progress Notes (Signed)
Subjective: Presents for recheck on her hypertension.  Adherent to medication regimen.  Minimal wheezing, occasional use of her albuterol.  Very active lifestyle.  Does well with her diet overall. Denies CP/ischemic type pain or SOB. No visual changes. No difficulty speaking or swallowing. No numbness or weakness of the face, arms or legs.  Request a referral to dermatology for routine skin cancer screening, has had this done in the past.   Objective:   BP 120/82   Ht 5' 6.5" (1.689 m)   Wt 192 lb 0.2 oz (87.1 kg)   BMI 30.53 kg/m  NAD.  Alert, oriented.  Cheerful affect.  Lungs clear.  Heart regular rate and rhythm.  Carotids no bruits or thrills.  Lower extremities no edema.  Weight stable.  Assessment:   Problem List Items Addressed This Visit      Cardiovascular and Mediastinum   Hypertension - Primary   Relevant Medications   hydrochlorothiazide (HYDRODIURIL) 25 MG tablet   lisinopril (PRINIVIL,ZESTRIL) 20 MG tablet    Other Visit Diagnoses    Screening for skin cancer       Relevant Orders   Ambulatory referral to Dermatology       Plan:   Meds ordered this encounter  Medications  . hydrochlorothiazide (HYDRODIURIL) 25 MG tablet    Sig: Take 1 tablet (25 mg total) by mouth every morning.    Dispense:  90 tablet    Refill:  1    Order Specific Question:   Supervising Provider    Answer:   Mikey Kirschner [[id number]]  . lisinopril (PRINIVIL,ZESTRIL) 20 MG tablet    Sig: Take 1 tablet (20 mg total) by mouth daily.    Dispense:  90 tablet    Refill:  1    Order Specific Question:   Supervising Provider    Answer:   Mikey Kirschner [[id number]]   Continue current regimen as directed.  Encouraged continued activity and healthy diet. Return in about 6 months (around 08/03/2018) for physical and labs.

## 2018-02-11 ENCOUNTER — Encounter: Payer: Self-pay | Admitting: Family Medicine

## 2018-02-26 DIAGNOSIS — L821 Other seborrheic keratosis: Secondary | ICD-10-CM | POA: Diagnosis not present

## 2018-02-26 DIAGNOSIS — L309 Dermatitis, unspecified: Secondary | ICD-10-CM | POA: Diagnosis not present

## 2018-02-26 DIAGNOSIS — L570 Actinic keratosis: Secondary | ICD-10-CM | POA: Diagnosis not present

## 2018-02-27 ENCOUNTER — Ambulatory Visit: Payer: Medicare HMO | Admitting: Family Medicine

## 2018-02-27 ENCOUNTER — Encounter: Payer: Self-pay | Admitting: Family Medicine

## 2018-02-27 VITALS — BP 130/80 | Temp 98.0°F | Ht 66.5 in | Wt 190.8 lb

## 2018-02-27 DIAGNOSIS — N3001 Acute cystitis with hematuria: Secondary | ICD-10-CM | POA: Diagnosis not present

## 2018-02-27 DIAGNOSIS — R319 Hematuria, unspecified: Secondary | ICD-10-CM | POA: Diagnosis not present

## 2018-02-27 LAB — POCT URINALYSIS DIPSTICK
PH UA: 6 (ref 5.0–8.0)
Spec Grav, UA: 1.02 (ref 1.010–1.025)

## 2018-02-27 MED ORDER — CEFPROZIL 500 MG PO TABS: 500.0000 mg | ORAL_TABLET | Freq: Two times a day (BID) | ORAL | 0 refills | Status: DC

## 2018-02-27 NOTE — Progress Notes (Signed)
   Subjective:    Patient ID: Kayla [name], female    DOB: Dec 27, 1945, 72 y.o.   MRN: [id number]  HPI  Patient arrives with hematuria and urinary frequency. Patient relates that her urine is blood-tinged she relates slight this dysuria but denies high fever chills flank pain denies abdominal pain has a sister who had a had bladder cancer patient does not smoke no weight loss Review of Systems  Constitutional: Negative for activity change, fatigue and fever.  HENT: Negative for congestion and rhinorrhea.   Respiratory: Negative for cough, chest tightness and shortness of breath.   Cardiovascular: Negative for chest pain and leg swelling.  Gastrointestinal: Negative for abdominal pain and nausea.  Genitourinary: Positive for frequency and hematuria.  Skin: Negative for color change.  Neurological: Negative for dizziness and headaches.  Psychiatric/Behavioral: Negative for agitation and behavioral problems.       Objective:   Physical Exam  Constitutional: She appears well-nourished. No distress.  HENT:  Head: Normocephalic and atraumatic.  Eyes: Right eye exhibits no discharge. Left eye exhibits no discharge.  Neck: No tracheal deviation present.  Cardiovascular: Normal rate, regular rhythm and normal heart sounds.  No murmur heard. Pulmonary/Chest: Effort normal and breath sounds normal. No respiratory distress.  Abdominal: Soft. There is no tenderness. There is no guarding.  Musculoskeletal: She exhibits no edema.  Lymphadenopathy:    She has no cervical adenopathy.  Neurological: She is alert. Coordination normal.  Skin: Skin is warm and dry.  Psychiatric: She has a normal mood and affect. Her behavior is normal.  Vitals reviewed.         Assessment & Plan:  Under the microscope she does have some WBCs and RBCs We will send for culture We will go ahead and treat for infection If the culture does not come back with infection because she has reported some  intermittent red appearing urine in attentional blood in the urine I would recommend referral to urology if culture negative  If culture positive should clear up with antibiotics plus patient to do follow-up office visit in 3 to 4 weeks and check a urine at that time

## 2018-03-04 LAB — URINE CULTURE

## 2018-03-04 LAB — SPECIMEN STATUS REPORT

## 2018-03-05 ENCOUNTER — Other Ambulatory Visit: Payer: Self-pay | Admitting: *Deleted

## 2018-03-05 MED ORDER — NITROFURANTOIN MONOHYD MACRO 100 MG PO CAPS: 100.0000 mg | ORAL_CAPSULE | Freq: Two times a day (BID) | ORAL | 0 refills | Status: DC

## 2018-03-24 ENCOUNTER — Encounter: Payer: Self-pay | Admitting: Family Medicine

## 2018-03-24 ENCOUNTER — Ambulatory Visit: Payer: Medicare HMO | Admitting: Family Medicine

## 2018-03-24 VITALS — BP 136/86 | Ht 66.5 in | Wt 190.4 lb

## 2018-03-24 DIAGNOSIS — R319 Hematuria, unspecified: Secondary | ICD-10-CM | POA: Diagnosis not present

## 2018-03-24 LAB — POCT URINALYSIS DIPSTICK
PH UA: 6 (ref 5.0–8.0)
SPEC GRAV UA: 1.015 (ref 1.010–1.025)

## 2018-03-24 MED ORDER — CIPROFLOXACIN HCL 500 MG PO TABS: 500.0000 mg | ORAL_TABLET | Freq: Two times a day (BID) | ORAL | 0 refills | Status: DC

## 2018-03-24 NOTE — Progress Notes (Signed)
   Subjective:    Patient ID: Kayla [name], female    DOB: [birth year], 72 y.o.   MRN: [id number]  HPI  Patient arrives with ongoing blood in urine. This patient today relates intermittent hematuria since last being seen her urine culture did show bacteria she was treated with appropriate antibiotic  Upon further discussion she states she has seen some blood off and on for 3 months she also has a family history of bladder cancer she does not smoke she does not utilize chemicals  Over the past week she has had some dysuria urinary frequency lower abdominal pressure and lower back pressure denies nausea vomiting Review of Systems  Constitutional: Negative for activity change, fatigue and fever.  HENT: Negative for congestion and rhinorrhea.   Respiratory: Negative for cough, chest tightness and shortness of breath.   Cardiovascular: Negative for chest pain and leg swelling.  Gastrointestinal: Negative for abdominal pain and nausea.  Genitourinary: Positive for dysuria and hematuria.  Skin: Negative for color change.  Neurological: Negative for dizziness and headaches.  Psychiatric/Behavioral: Negative for agitation and behavioral problems.       Objective:   Physical Exam  Constitutional: She appears well-nourished. No distress.  HENT:  Head: Normocephalic and atraumatic.  Eyes: Right eye exhibits no discharge. Left eye exhibits no discharge.  Neck: No tracheal deviation present.  Cardiovascular: Normal rate, regular rhythm and normal heart sounds.  No murmur heard. Pulmonary/Chest: Effort normal and breath sounds normal. No respiratory distress.  Musculoskeletal: She exhibits no edema.  Lymphadenopathy:    She has no cervical adenopathy.  Neurological: She is alert. Coordination normal.  Skin: Skin is warm and dry.  Psychiatric: She has a normal mood and affect. Her behavior is normal.  Vitals reviewed.  Urine with TNTC RBC       Assessment & Plan:  We will send for  culture 1 more additional round of antibiotic This needs to be thoroughly worked up Patient needs urgent referral to [facility] for cystoscope to rule out bladder cancer Patient needs CT scan abdomen pelvis with and without contrast to help eliminate the potential other causes including renal cell cancer Certainly we hope for the best We will inform the patient of the results of the test when they come back Patient to be seen by urology as per above Patient understands

## 2018-03-25 ENCOUNTER — Other Ambulatory Visit: Payer: Self-pay | Admitting: *Deleted

## 2018-03-25 ENCOUNTER — Telehealth: Payer: Self-pay | Admitting: *Deleted

## 2018-03-25 DIAGNOSIS — Z79899 Other long term (current) drug therapy: Secondary | ICD-10-CM

## 2018-03-25 NOTE — Telephone Encounter (Signed)
Ok thanks 

## 2018-03-25 NOTE — Telephone Encounter (Signed)
Pt needs kidney function before scan. Met 7 ordered per protocol. Pt notified to get bloodwork done at least two days before scan. Also gave pt appt info for scan. appt 9/11 at [facility] 8:45 NPO for 4 hours prior to scan. Pt verbalized understanding.

## 2018-03-28 LAB — URINE CULTURE

## 2018-04-02 DIAGNOSIS — Z79899 Other long term (current) drug therapy: Secondary | ICD-10-CM | POA: Diagnosis not present

## 2018-04-03 ENCOUNTER — Encounter: Payer: Self-pay | Admitting: Family Medicine

## 2018-04-03 LAB — BASIC METABOLIC PANEL
BUN/Creatinine Ratio: 18 (ref 12–28)
BUN: 18 mg/dL (ref 8–27)
CHLORIDE: 103 mmol/L (ref 96–106)
CO2: 23 mmol/L (ref 20–29)
Calcium: 9 mg/dL (ref 8.7–10.3)
Creatinine, Ser: 0.98 mg/dL (ref 0.57–1.00)
GFR calc non Af Amer: 57 mL/min/{1.73_m2} — ABNORMAL LOW (ref >59)
GFR, EST AFRICAN AMERICAN: 66 mL/min/{1.73_m2} (ref >59)
GLUCOSE: 105 mg/dL — AB (ref 65–99)
POTASSIUM: 4.5 mmol/L (ref 3.5–5.2)
Sodium: 144 mmol/L (ref 134–144)

## 2018-04-09 ENCOUNTER — Telehealth: Payer: Self-pay | Admitting: *Deleted

## 2018-04-09 ENCOUNTER — Ambulatory Visit (HOSPITAL_COMMUNITY)
Admission: RE | Admit: 2018-04-09 | Discharge: 2018-04-09 | Disposition: A | Discharge status: Home / Self Care | Payer: Medicare HMO | Source: Ambulatory Visit | Attending: Family Medicine | Admitting: Family Medicine

## 2018-04-09 DIAGNOSIS — K8020 Calculus of gallbladder without cholecystitis without obstruction: Secondary | ICD-10-CM | POA: Diagnosis not present

## 2018-04-09 DIAGNOSIS — N329 Bladder disorder, unspecified: Secondary | ICD-10-CM | POA: Insufficient documentation

## 2018-04-09 DIAGNOSIS — R319 Hematuria, unspecified: Secondary | ICD-10-CM | POA: Insufficient documentation

## 2018-04-09 DIAGNOSIS — D1771 Benign lipomatous neoplasm of kidney: Secondary | ICD-10-CM | POA: Insufficient documentation

## 2018-04-09 MED ORDER — IOPAMIDOL (ISOVUE-300) INJECTION 61%
150.0000 mL | Freq: Once | INTRAVENOUS | Status: AC | PRN
  Administered 2018-04-09: 125 mL via INTRAVENOUS

## 2018-04-09 NOTE — Telephone Encounter (Signed)
Olivia Mackie from [facility] radiology calling to give report on ct scan.  Results are in epic. Please review.

## 2018-04-11 ENCOUNTER — Ambulatory Visit (INDEPENDENT_AMBULATORY_CARE_PROVIDER_SITE_OTHER): Payer: Medicare HMO | Admitting: Family Medicine

## 2018-04-11 ENCOUNTER — Encounter: Payer: Self-pay | Admitting: Family Medicine

## 2018-04-11 ENCOUNTER — Telehealth: Payer: Self-pay | Admitting: Family Medicine

## 2018-04-11 VITALS — BP 128/84 | Ht 66.0 in | Wt 190.0 lb

## 2018-04-11 DIAGNOSIS — D494 Neoplasm of unspecified behavior of bladder: Secondary | ICD-10-CM

## 2018-04-11 NOTE — Telephone Encounter (Signed)
Pt checking on referral for urology. [facility] contacted [facility] and they need a referral before they can schedule with her.

## 2018-04-11 NOTE — Progress Notes (Signed)
   Subjective:    Patient ID: Kayla [name], female    DOB: 10-02-1945, 72 y.o.   MRN: [id number]  HPIFollow up ct scan results.  Patient had CT showed bladder tumor here to discuss Denies pain Still with hematuria No fevers No dysuria   Review of Systems  Constitutional: Negative for activity change, appetite change and fatigue.  HENT: Negative for congestion.   Respiratory: Negative for cough.   Cardiovascular: Negative for chest pain.  Gastrointestinal: Negative for abdominal pain.  Genitourinary: Positive for hematuria. Negative for dysuria and frequency.  Skin: Negative for color change.  Neurological: Negative for headaches.  Psychiatric/Behavioral: Negative for behavioral problems.       Objective:   Physical Exam   1520 minutes spent with patient discussing her CAT scan discussing the ramifications of this and what she needs I did call [facility] to help get her in sooner    Assessment & Plan:  More than likely bladder cancer Has appointment coming up with [facility] Patient is aware of all this and understands the importance of following through Patient also aware of fatty liver and gallstones Patient will do the best she can in following through on everything she will notify us if any problems

## 2018-04-12 NOTE — Telephone Encounter (Signed)
Scan did show tumor, and the patient was spoken with face-to-face on Friday.  Has appointment with [facility].  She understands the importance of following this through to treat this more than likely cancer

## 2018-04-15 DIAGNOSIS — R310 Gross hematuria: Secondary | ICD-10-CM | POA: Diagnosis not present

## 2018-04-15 NOTE — Telephone Encounter (Signed)
Appointment scheduled 04/15/18 & pt aware

## 2018-04-17 ENCOUNTER — Other Ambulatory Visit: Payer: Self-pay | Admitting: Urology

## 2018-04-29 NOTE — Patient Instructions (Signed)
Kayla [name]  04/29/2018     @PREFPERIOPPHARMACY @   Your procedure is scheduled on 05/05/2018.  Report to [facility] at 9:00 A.M.  Call this number if you have problems the morning of surgery:  [phone]   Remember:  Do not eat or drink after midnight.      Take these medicines the morning of surgery with A SIP OF WATER Albuterol inhaler, Lisinopril, Claritin, Mobic    Do not wear jewelry, make-up or nail polish.  Do not wear lotions, powders, or perfumes, or deodorant.  Do not shave 48 hours prior to surgery.  Men may shave face and neck.  Do not bring valuables to the hospital.  [facility] is not responsible for any belongings or valuables.  Contacts, dentures or bridgework may not be worn into surgery.  Leave your suitcase in the car.  After surgery it may be brought to your room.  For patients admitted to the hospital, discharge time will be determined by your treatment team.  Patients discharged the day of surgery will not be allowed to drive home.    Please read over the following fact sheets that you were given. Surgical Site Infection Prevention and Anesthesia Post-op Instructions     PATIENT INSTRUCTIONS POST-ANESTHESIA  IMMEDIATELY FOLLOWING SURGERY:  Do not drive or operate machinery for the first twenty four hours after surgery.  Do not make any important decisions for twenty four hours after surgery or while taking narcotic pain medications or sedatives.  If you develop intractable nausea and vomiting or a severe headache please notify your doctor immediately.  FOLLOW-UP:  Please make an appointment with your surgeon as instructed. You do not need to follow up with anesthesia unless specifically instructed to do so.  WOUND CARE INSTRUCTIONS (if applicable):  Keep a dry clean dressing on the anesthesia/puncture wound site if there is drainage.  Once the wound has quit draining you may leave it open to air.  Generally you should leave the bandage intact  for twenty four hours unless there is drainage.  If the epidural site drains for more than 36-48 hours please call the anesthesia department.  QUESTIONS?:  Please feel free to call your physician or the hospital operator if you have any questions, and they will be happy to assist you.      Transurethral Resection of Bladder Tumor Transurethral resection of a bladder tumor is the removal (resection) of a cancerous growth (tumor) on the inside wall of the bladder. The bladder is the organ that holds urine. The tumor is removed through the tube that carries urine out of the body (urethra). In a transurethral resection, a thin telescope with a light, a tiny camera, and an electric cutting edge (resectoscope) is passed through the urethra. In men, the opening of the urethra is at the end of the penis. In women, it is just above the vaginal opening. Tell a health care provider about:  Any allergies you have.  All medicines you are taking, including vitamins, herbs, eye drops, creams, and over-the-counter medicines.  Any problems you or family members have had with anesthetic medicines.  Any blood disorders you have.  Any surgeries you have had.  Any medical conditions you have.  Any recent urinary tract infections you have had.  Whether you are pregnant or may be pregnant. What are the risks? Generally, this is a safe procedure. However, problems may occur, including:  Infection.  Bleeding.  Allergic reactions to medicines.  Damage to  other structures or organs, such as: ? The urethra. ? The tubes that drain urine from the kidneys into the bladder (ureters).  Pain and burning during urination.  Difficulty urinating due to partial blockage of the urethra.  Inability to urinate (urinary retention).  What happens before the procedure?  Follow instructions from your health care provider about eating and drinking restrictions.  Ask your health care provider about: ? Changing or  stopping your regular medicines. This is especially important if you are taking diabetes medicines or blood thinners. ? Taking medicines such as aspirin and ibuprofen. These medicines can thin your blood. Do not take these medicines before your procedure if your health care provider instructs you not to.  You may have a physical exam.  You may have tests, including: ? Blood tests. ? Urine tests. ? Electrocardiogram (ECG). This test measures the electrical activity of the heart.  You may be given antibiotic medicine to help prevent infection.  Ask your health care provider how your surgical site will be marked or identified.  Plan to have someone take you home after the procedure. What happens during the procedure?  To reduce your risk of infection: ? Your health care team will wash or sanitize their hands. ? Your skin will be washed with soap.  An IV tube will be inserted into one of your veins.  You will be given one or more of the following: ? A medicine to help you relax (sedative). ? A medicine to make you fall asleep (general anesthetic). ? A medicine that is injected into your spine to numb the area below and slightly above the injection site (spinal anesthetic).  Your legs will be placed in foot rests (stirrups) so that your legs are apart and your knees are bent.  The resectoscope will be passed through your urethra and into your bladder.  The part of your bladder that is affected by the tumor will be resected using the cutting edge of the resectoscope.  The resectoscope will be removed.  A thin, flexible tube (catheter) will be passed through your urethra and into your bladder. The catheter will drain urine into a bag outside of your body. ? Fluid may be passed through the catheter to keep the catheter open. The procedure may vary among health care providers and hospitals. What happens after the procedure?  Your blood pressure, heart rate, breathing rate, and blood  oxygen level will be monitored often until the medicines you were given have worn off.  You may continue to receive fluids and medicines through an IV tube.  You will have some pain. Pain medicine will be available to help you.  You will have a catheter draining your urine. ? You will have blood in your urine. Your catheter may be kept in until your urine is clear. ? Your urinary drainage will be monitored. If necessary, your bladder may be rinsed out (irrigated) by passing fluid through your catheter.  You will be encouraged to walk around as soon as possible.  You may have to wear compression stockings. These stockings help to prevent blood clots and reduce swelling in your legs.  Do not drive for 24 hours if you received a sedative. This information is not intended to replace advice given to you by your health care provider. Make sure you discuss any questions you have with your health care provider. Document Released: 05/12/2009 Document Revised: 03/18/2016 Document Reviewed: 04/07/2015 Elsevier Interactive Patient Education  2018 [employer]. Cystoscopy Cystoscopy is a  procedure that is used to help diagnose and sometimes treat conditions that affect that lower urinary tract. The lower urinary tract includes the bladder and the tube that drains urine from the bladder out of the body (urethra). Cystoscopy is performed with a thin, tube-shaped instrument with a light and camera at the end (cystoscope). The cystoscope may be hard (rigid) or flexible, depending on the goal of the procedure.The cystoscope is inserted through the urethra, into the bladder. Cystoscopy may be recommended if you have:  Urinary tractinfections that keep coming back (recurring).  Blood in the urine (hematuria).  Loss of bladder control (urinary incontinence) or an overactive bladder.  Unusual cells found in a urine sample.  A blockage in the urethra.  Painful urination.  An abnormality in the bladder  found during an intravenous pyelogram (IVP) or CT scan.  Cystoscopy may also be done to remove a sample of tissue to be examined under a microscope (biopsy). Tell a health care provider about:  Any allergies you have.  All medicines you are taking, including vitamins, herbs, eye drops, creams, and over-the-counter medicines.  Any problems you or family members have had with anesthetic medicines.  Any blood disorders you have.  Any surgeries you have had.  Any medical conditions you have.  Whether you are pregnant or may be pregnant. What are the risks? Generally, this is a safe procedure. However, problems may occur, including:  Infection.  Bleeding.  Allergic reactions to medicines.  Damage to other structures or organs.  What happens before the procedure?  Ask your health care provider about: ? Changing or stopping your regular medicines. This is especially important if you are taking diabetes medicines or blood thinners. ? Taking medicines such as aspirin and ibuprofen. These medicines can thin your blood. Do not take these medicines before your procedure if your health care provider instructs you not to.  Follow instructions from your health care provider about eating or drinking restrictions.  You may be given antibiotic medicine to help prevent infection.  You may have an exam or testing, such as X-rays of the bladder, urethra, or kidneys.  You may have urine tests to check for signs of infection.  Plan to have someone take you home after the procedure. What happens during the procedure?  To reduce your risk of infection,your health care team will wash or sanitize their hands.  You will be given one or more of the following: ? A medicine to help you relax (sedative). ? A medicine to numb the area (local anesthetic).  The area around the opening of your urethra will be cleaned.  The cystoscope will be passed through your urethra into your  bladder.  Germ-free (sterile)fluid will flow through the cystoscope to fill your bladder. The fluid will stretch your bladder so that your surgeon can clearly examine your bladder walls.  The cystoscope will be removed and your bladder will be emptied. The procedure may vary among health care providers and hospitals. What happens after the procedure?  You may have some soreness or pain in your abdomen and urethra. Medicines will be available to help you.  You may have some blood in your urine.  Do not drive for 24 hours if you received a sedative. This information is not intended to replace advice given to you by your health care provider. Make sure you discuss any questions you have with your health care provider. Document Released: 07/13/2000 Document Revised: 11/24/2015 Document Reviewed: 06/02/2015 Elsevier Interactive Patient Education  2018 [employer]  

## 2018-04-30 ENCOUNTER — Other Ambulatory Visit: Payer: Self-pay

## 2018-04-30 ENCOUNTER — Encounter (HOSPITAL_COMMUNITY): Payer: Self-pay

## 2018-04-30 ENCOUNTER — Encounter (HOSPITAL_COMMUNITY)
Admission: RE | Admit: 2018-04-30 | Discharge: 2018-04-30 | Disposition: A | Discharge status: Home / Self Care | Payer: Medicare HMO | Source: Ambulatory Visit | Attending: Urology | Admitting: Urology

## 2018-04-30 DIAGNOSIS — Z01818 Encounter for other preprocedural examination: Secondary | ICD-10-CM | POA: Insufficient documentation

## 2018-04-30 HISTORY — DX: Unspecified osteoarthritis, unspecified site: M19.90

## 2018-04-30 LAB — CBC
HCT: 43.5 % (ref 36.0–46.0)
Hemoglobin: 14.5 g/dL (ref 12.0–15.0)
MCH: 30.8 pg (ref 26.0–34.0)
MCHC: 33.3 g/dL (ref 30.0–36.0)
MCV: 92.4 fL (ref 78.0–100.0)
PLATELETS: 192 10*3/uL (ref 150–400)
RBC: 4.71 MIL/uL (ref 3.87–5.11)
RDW: 12.2 % (ref 11.5–15.5)
WBC: 7.2 10*3/uL (ref 4.0–10.5)

## 2018-04-30 LAB — BASIC METABOLIC PANEL
Anion gap: 9 (ref 5–15)
BUN: 14 mg/dL (ref 8–23)
CO2: 26 mmol/L (ref 22–32)
CREATININE: 1.05 mg/dL — AB (ref 0.44–1.00)
Calcium: 8.8 mg/dL — ABNORMAL LOW (ref 8.9–10.3)
Chloride: 103 mmol/L (ref 98–111)
GFR calc Af Amer: 60 mL/min — ABNORMAL LOW (ref >60)
GFR, EST NON AFRICAN AMERICAN: 51 mL/min — AB (ref >60)
Glucose, Bld: 99 mg/dL (ref 70–99)
POTASSIUM: 3.6 mmol/L (ref 3.5–5.1)
SODIUM: 138 mmol/L (ref 135–145)

## 2018-05-05 ENCOUNTER — Ambulatory Visit (HOSPITAL_COMMUNITY): Payer: Medicare HMO | Admitting: Anesthesiology

## 2018-05-05 ENCOUNTER — Encounter (HOSPITAL_COMMUNITY): Payer: Self-pay | Admitting: *Deleted

## 2018-05-05 ENCOUNTER — Other Ambulatory Visit: Payer: Self-pay

## 2018-05-05 ENCOUNTER — Ambulatory Visit (HOSPITAL_COMMUNITY): Payer: Medicare HMO

## 2018-05-05 ENCOUNTER — Ambulatory Visit (HOSPITAL_COMMUNITY)
Admission: RE | Admit: 2018-05-05 | Discharge: 2018-05-05 | Disposition: A | Discharge status: Home / Self Care | Payer: Medicare HMO | Source: Ambulatory Visit | Attending: Urology | Admitting: Urology

## 2018-05-05 ENCOUNTER — Encounter (HOSPITAL_COMMUNITY)
Admission: RE | Disposition: A | Discharge status: Home / Self Care | Payer: Self-pay | Source: Ambulatory Visit | Attending: Urology

## 2018-05-05 DIAGNOSIS — C676 Malignant neoplasm of ureteric orifice: Secondary | ICD-10-CM | POA: Diagnosis not present

## 2018-05-05 DIAGNOSIS — C672 Malignant neoplasm of lateral wall of bladder: Secondary | ICD-10-CM | POA: Diagnosis not present

## 2018-05-05 DIAGNOSIS — N1330 Unspecified hydronephrosis: Secondary | ICD-10-CM | POA: Insufficient documentation

## 2018-05-05 DIAGNOSIS — M1990 Unspecified osteoarthritis, unspecified site: Secondary | ICD-10-CM | POA: Insufficient documentation

## 2018-05-05 DIAGNOSIS — M8580 Other specified disorders of bone density and structure, unspecified site: Secondary | ICD-10-CM | POA: Diagnosis not present

## 2018-05-05 DIAGNOSIS — I10 Essential (primary) hypertension: Secondary | ICD-10-CM | POA: Diagnosis not present

## 2018-05-05 DIAGNOSIS — J45990 Exercise induced bronchospasm: Secondary | ICD-10-CM | POA: Insufficient documentation

## 2018-05-05 DIAGNOSIS — K589 Irritable bowel syndrome without diarrhea: Secondary | ICD-10-CM | POA: Insufficient documentation

## 2018-05-05 DIAGNOSIS — D494 Neoplasm of unspecified behavior of bladder: Secondary | ICD-10-CM | POA: Diagnosis not present

## 2018-05-05 DIAGNOSIS — Z79899 Other long term (current) drug therapy: Secondary | ICD-10-CM | POA: Insufficient documentation

## 2018-05-05 DIAGNOSIS — K219 Gastro-esophageal reflux disease without esophagitis: Secondary | ICD-10-CM | POA: Diagnosis not present

## 2018-05-05 DIAGNOSIS — J45909 Unspecified asthma, uncomplicated: Secondary | ICD-10-CM | POA: Diagnosis not present

## 2018-05-05 DIAGNOSIS — Z8249 Family history of ischemic heart disease and other diseases of the circulatory system: Secondary | ICD-10-CM | POA: Insufficient documentation

## 2018-05-05 DIAGNOSIS — I872 Venous insufficiency (chronic) (peripheral): Secondary | ICD-10-CM | POA: Diagnosis not present

## 2018-05-05 DIAGNOSIS — Z888 Allergy status to other drugs, medicaments and biological substances status: Secondary | ICD-10-CM | POA: Insufficient documentation

## 2018-05-05 DIAGNOSIS — C679 Malignant neoplasm of bladder, unspecified: Secondary | ICD-10-CM | POA: Diagnosis not present

## 2018-05-05 HISTORY — PX: TRANSURETHRAL RESECTION OF BLADDER TUMOR: SHX2575

## 2018-05-05 HISTORY — PX: CYSTOSCOPY W/ URETERAL STENT PLACEMENT: SHX1429

## 2018-05-05 SURGERY — TURBT (TRANSURETHRAL RESECTION OF BLADDER TUMOR)
Anesthesia: General

## 2018-05-05 MED ORDER — FENTANYL CITRATE (PF) 100 MCG/2ML IJ SOLN
INTRAMUSCULAR | Status: DC | PRN
  Administered 2018-05-05: 25 ug via INTRAVENOUS
  Administered 2018-05-05: 100 ug via INTRAVENOUS
  Administered 2018-05-05 (×3): 25 ug via INTRAVENOUS

## 2018-05-05 MED ORDER — OXYCODONE-ACETAMINOPHEN 5-325 MG PO TABS: 1.0000 | ORAL_TABLET | ORAL | 0 refills | Status: DC | PRN

## 2018-05-05 MED ORDER — SUGAMMADEX SODIUM 500 MG/5ML IV SOLN
INTRAVENOUS | Status: DC | PRN
  Administered 2018-05-05: 200 mg via INTRAVENOUS

## 2018-05-05 MED ORDER — ROCURONIUM BROMIDE 50 MG/5ML IV SOLN
INTRAVENOUS | Status: AC
  Filled 2018-05-05: qty 1

## 2018-05-05 MED ORDER — MIDAZOLAM HCL 2 MG/2ML IJ SOLN: 0.5000 mg | Freq: Once | INTRAMUSCULAR | Status: DC | PRN

## 2018-05-05 MED ORDER — PROPOFOL 10 MG/ML IV BOLUS
INTRAVENOUS | Status: DC | PRN
  Administered 2018-05-05: 150 mg via INTRAVENOUS
  Administered 2018-05-05: 10 mg via INTRAVENOUS
  Administered 2018-05-05: 20 mg via INTRAVENOUS

## 2018-05-05 MED ORDER — ONDANSETRON HCL 4 MG/2ML IJ SOLN
INTRAMUSCULAR | Status: AC
  Filled 2018-05-05: qty 2

## 2018-05-05 MED ORDER — PROPOFOL 10 MG/ML IV BOLUS
INTRAVENOUS | Status: AC
  Filled 2018-05-05: qty 20

## 2018-05-05 MED ORDER — SUGAMMADEX SODIUM 200 MG/2ML IV SOLN
INTRAVENOUS | Status: AC
  Filled 2018-05-05: qty 2

## 2018-05-05 MED ORDER — FENTANYL CITRATE (PF) 100 MCG/2ML IJ SOLN
INTRAMUSCULAR | Status: AC
  Filled 2018-05-05: qty 2

## 2018-05-05 MED ORDER — OXYBUTYNIN CHLORIDE ER 10 MG PO TB24: 10.0000 mg | ORAL_TABLET | Freq: Every day | ORAL | 1 refills | Status: DC

## 2018-05-05 MED ORDER — LACTATED RINGERS IV SOLN
INTRAVENOUS | Status: DC
  Administered 2018-05-05 (×2): via INTRAVENOUS

## 2018-05-05 MED ORDER — PROMETHAZINE HCL 25 MG/ML IJ SOLN: 6.2500 mg | INTRAMUSCULAR | Status: DC | PRN

## 2018-05-05 MED ORDER — DIATRIZOATE MEGLUMINE 30 % UR SOLN
URETHRAL | Status: DC | PRN
  Administered 2018-05-05: 9 mL

## 2018-05-05 MED ORDER — HYDROMORPHONE HCL 1 MG/ML IJ SOLN: 0.2500 mg | INTRAMUSCULAR | Status: DC | PRN

## 2018-05-05 MED ORDER — SODIUM CHLORIDE 0.9 % IJ SOLN
INTRAMUSCULAR | Status: AC
  Filled 2018-05-05: qty 20

## 2018-05-05 MED ORDER — STERILE WATER FOR IRRIGATION IR SOLN
Status: DC | PRN
  Administered 2018-05-05: 1000 mL

## 2018-05-05 MED ORDER — DIATRIZOATE MEGLUMINE 30 % UR SOLN
URETHRAL | Status: AC
  Filled 2018-05-05: qty 100

## 2018-05-05 MED ORDER — EPHEDRINE SULFATE 50 MG/ML IJ SOLN
INTRAMUSCULAR | Status: DC | PRN
  Administered 2018-05-05 (×7): 5 mg via INTRAVENOUS

## 2018-05-05 MED ORDER — SODIUM CHLORIDE 0.9 % IR SOLN
Status: DC | PRN
  Administered 2018-05-05 (×6): 3000 mL via INTRAVESICAL

## 2018-05-05 MED ORDER — HYDROCODONE-ACETAMINOPHEN 7.5-325 MG PO TABS: 1.0000 | ORAL_TABLET | Freq: Once | ORAL | Status: DC | PRN

## 2018-05-05 MED ORDER — CEFAZOLIN SODIUM-DEXTROSE 2-4 GM/100ML-% IV SOLN
2.0000 g | INTRAVENOUS | Status: AC
  Administered 2018-05-05: 2 g via INTRAVENOUS
  Filled 2018-05-05: qty 100

## 2018-05-05 MED ORDER — EPHEDRINE SULFATE 50 MG/ML IJ SOLN
INTRAMUSCULAR | Status: AC
  Filled 2018-05-05: qty 1

## 2018-05-05 MED ORDER — ROCURONIUM BROMIDE 100 MG/10ML IV SOLN
INTRAVENOUS | Status: DC | PRN
  Administered 2018-05-05: 40 mg via INTRAVENOUS
  Administered 2018-05-05 (×2): 10 mg via INTRAVENOUS

## 2018-05-05 MED ORDER — ONDANSETRON HCL 4 MG/2ML IJ SOLN
INTRAMUSCULAR | Status: DC | PRN
  Administered 2018-05-05: 4 mg via INTRAVENOUS

## 2018-05-05 SURGICAL SUPPLY — 34 items
BAG DRAIN URO TABLE W/ADPT NS (BAG) ×4 IMPLANT
BAG URINE DRAINAGE (UROLOGICAL SUPPLIES) ×4 IMPLANT
CATH FOLEY 3WAY 30CC 22F (CATHETERS) ×4 IMPLANT
CATH FOLEY LATEX FREE 22FR (CATHETERS)
CATH FOLEY LF 22FR (CATHETERS) IMPLANT
CATH INTERMIT  6FR 70CM (CATHETERS) ×4 IMPLANT
CLOTH BEACON ORANGE TIMEOUT ST (SAFETY) ×4 IMPLANT
DECANTER SPIKE VIAL GLASS SM (MISCELLANEOUS) ×4 IMPLANT
ELECT LOOP 22F BIPOLAR SML (ELECTROSURGICAL) ×4
ELECT REM PT RETURN 9FT ADLT (ELECTROSURGICAL) ×4
ELECTRODE LOOP 22F BIPOLAR SML (ELECTROSURGICAL) ×2 IMPLANT
ELECTRODE REM PT RTRN 9FT ADLT (ELECTROSURGICAL) ×2 IMPLANT
GLOVE BIO SURGEON STRL SZ8 (GLOVE) ×4 IMPLANT
GLOVE BIOGEL M 7.0 STRL (GLOVE) ×4 IMPLANT
GLOVE BIOGEL PI IND STRL 7.0 (GLOVE) ×4 IMPLANT
GLOVE BIOGEL PI INDICATOR 7.0 (GLOVE) ×4
GOWN STRL REUS W/ TWL LRG LVL3 (GOWN DISPOSABLE) ×2 IMPLANT
GOWN STRL REUS W/ TWL XL LVL3 (GOWN DISPOSABLE) ×2 IMPLANT
GOWN STRL REUS W/TWL LRG LVL3 (GOWN DISPOSABLE) ×6 IMPLANT
GOWN STRL REUS W/TWL XL LVL3 (GOWN DISPOSABLE) ×6 IMPLANT
GUIDEWIRE STR DUAL SENSOR (WIRE) ×4 IMPLANT
GUIDEWIRE STR ZIPWIRE 035X150 (MISCELLANEOUS) ×4 IMPLANT
IV NS IRRIG 3000ML ARTHROMATIC (IV SOLUTION) ×24 IMPLANT
KIT TURNOVER CYSTO (KITS) ×4 IMPLANT
MANIFOLD NEPTUNE II (INSTRUMENTS) ×4 IMPLANT
PACK CYSTO (CUSTOM PROCEDURE TRAY) ×4 IMPLANT
PAD ARMBOARD 7.5X6 YLW CONV (MISCELLANEOUS) ×4 IMPLANT
PLUG CATH AND CAP STER (CATHETERS) IMPLANT
STENT CONTOUR 6FRX26X.035 (STENTS) ×4 IMPLANT
STENT URET 6FRX26 CONTOUR (STENTS) IMPLANT
SYR 10ML LL (SYRINGE) ×4 IMPLANT
SYR 30ML LL (SYRINGE) ×4 IMPLANT
SYRINGE IRR TOOMEY STRL 70CC (SYRINGE) ×4 IMPLANT
TOWEL OR 17X26 4PK STRL BLUE (TOWEL DISPOSABLE) ×4 IMPLANT

## 2018-05-05 NOTE — Op Note (Signed)
Preoperative diagnosis: Bladder Tumor  Postop diagnosis: Same  Procedure: 1.  Cystoscopy 2. Bilateral retrograde pyelography 3. Intra-operative fluoroscopy, under 1 hour, with interpretation 4.Transurethral resection of bladder tumor, large 5. Right 6 x 266 JJ ureteral Stent Placement     Attending: Nicolette Bang  Anesthesia: General  Estimated blood loss: 50 cc  Drains: 1. 18 French Foley catheter 2. right 6x26 JJ ureteralStent  Specimens: Bladder tumor  Antibiotics: Ancef  Findings: 6cm sessile tumor involving the right lateral wall ans right ureteral orifice. Mild right hydronephrosis. 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   After discussing treatment options patient decided to proceed with transurethral resection of bladder tumor  Procedure in detail: Prior to procedure consetn was obtained. Patient was brought to the operating room and briefing was done sure correct patient, correct procedure, correct site.  General anesthesia was in administered patient was placed in the dorsal lithotomy position.  The rigid 70 French cystoscope was passed urethra and bladder.  Bladder was inspected masses or lesions and we noted a 6 cm lesion involving the right lateral wall of the bladder and right ureteral orifice.  We then turned our attention to the left ureteral orifice.  The left ureteral orifice was cannulated with a 6 French ureteral catheter.  A gentle retrograde was obtained in findings noted above. We were unable to identify the right ureteral orifice at that time.  We then removed the cystoscope and placed a 27 French resectoscope in the bladder.  Using bipolar electrocautery were then removed the 2 bladder tumors.  We removed the bladder tumors down to the base exposing muscle.  We then removed the pieces and sent them for pathology.  To obtain hemostasis we then  cauterized the bed of the tumors.  Once good hemostasis was noted the then were able to identify the right ureteral orifice.  We then cannulated the right ureteral orifice with a 6 French ureteral catheter.  A gentle retrograde was obtained in findings noted above. We then placed a zip wire through the ureteral catheter and advanced up to the renal pelvis.  We then placed a 6 x 26 double-J ureteral stent over the wire.  We then removed the wire and good coil was noted in the pelvis under fluoroscopy in the bladder under direct vision.  the bladder was then drained and a 22 French Foley catheter was placed. This concluded the procedure which resulted by the patient.  Complications: None  Condition: Stable,  extubated, transferred to PACU.  Plan: the patient is to be discharged home and followup in 7 days for a voiding trial and pathology discussing. Here stent is to remain in place for 4 weeks

## 2018-05-05 NOTE — Transfer of Care (Signed)
Immediate Anesthesia Transfer of Care Note  Patient: Kayla [name]  Procedure(s) Performed: TRANSURETHRAL RESECTION OF BLADDER TUMOR (TURBT) (N/A ) CYSTOSCOPY WITH BILATERAL RETROGRADE PYELOGRAM/ RIGHT URETERAL STENT PLACEMENT (Bilateral )  Patient Location: PACU  Anesthesia Type:General  Level of Consciousness: awake, alert  and patient cooperative  Airway & Oxygen Therapy: Patient Spontanous Breathing  Post-op Assessment: Report given to RN and Post -op Vital signs reviewed and stable  Post vital signs: Reviewed and stable  Last Vitals:  Vitals Value Taken Time  BP 136/77 05/05/2018 12:24 PM  Temp    Pulse 105 05/05/2018 12:25 PM  Resp 13 05/05/2018 12:25 PM  SpO2 100 % 05/05/2018 12:25 PM  Vitals shown include unvalidated device data.  Last Pain:  Vitals:   05/05/18 0925  TempSrc: Oral  PainSc: 0-No pain      Patients Stated Pain Goal: 8 (63/84/66 [id number])  Complications: No apparent anesthesia complications

## 2018-05-05 NOTE — Anesthesia Preprocedure Evaluation (Signed)
Anesthesia Evaluation  Patient identified by MRN, date of birth, ID band Patient awake    Reviewed: Allergy & Precautions, NPO status , Patient's Chart, lab work & pertinent test results  Airway Mallampati: II  TM Distance: >3 FB Neck ROM: Full    Dental no notable dental hx. (+) Teeth Intact   Pulmonary neg pulmonary ROS, asthma ,  EIA -last albuterol about 12 months ago   Pulmonary exam normal breath sounds clear to auscultation       Cardiovascular Exercise Tolerance: Good hypertension, Pt. 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  negative neurological ROS  negative psych ROS   GI/Hepatic negative GI ROS, Neg liver ROS, GERD  Medicated and Controlled,Denies Current Sx today   Endo/Other  negative endocrine ROS  Renal/GU negative Renal ROSH/o Bladder Ca  negative genitourinary   Musculoskeletal negative musculoskeletal ROS (+) Arthritis ,   Abdominal   Peds negative pediatric ROS (+)  Hematology negative hematology ROS (+)   Anesthesia Other Findings   Reproductive/Obstetrics negative OB ROS                             Anesthesia Physical Anesthesia Plan  ASA: II  Anesthesia Plan: General   Post-op Pain Management:    Induction: Intravenous  PONV Risk Score and Plan:   Airway Management Planned: Oral ETT  Additional Equipment:   Intra-op Plan:   Post-operative Plan: Extubation in OR  Informed Consent: I have reviewed the patients History and Physical, chart, labs and discussed the procedure including the risks, benefits and alternatives for the proposed anesthesia with the patient or authorized representative who has indicated his/her understanding and acceptance.   Dental advisory given  Plan Discussed with: CRNA  Anesthesia Plan Comments:  (GETA requested by Dr. Caprice Beaver )        Anesthesia Quick Evaluation

## 2018-05-05 NOTE — Anesthesia Procedure Notes (Signed)
Procedure Name: Intubation Date/Time: 05/05/2018 10:39 AM Performed by: Vista Deck, CRNA Pre-anesthesia Checklist: Patient identified, Patient being monitored, Timeout performed, Emergency Drugs available and Suction available Patient Re-evaluated:Patient Re-evaluated prior to induction Oxygen Delivery Method: Circle System Utilized Preoxygenation: Pre-oxygenation with 100% oxygen Induction Type: IV induction Ventilation: Mask ventilation without difficulty Laryngoscope Size: Mac and 3 Grade View: Grade II Tube type: Oral Tube size: 7.0 mm Number of attempts: 1 Airway Equipment and Method: stylet and Oral airway Placement Confirmation: ETT inserted through vocal cords under direct vision,  positive ETCO2 and breath sounds checked- equal and bilateral Secured at: 22 cm Tube secured with: Tape Dental Injury: Teeth and Oropharynx as per pre-operative assessment

## 2018-05-05 NOTE — Discharge Instructions (Signed)
Indwelling Urinary Catheter Care, Adult Take good care of your catheter to keep it working and to prevent problems. How to wear your catheter Attach your catheter to your leg with tape (adhesive tape) or a leg strap. Make sure it is not too tight. If you use tape, remove any bits of tape that are already on the catheter. How to wear a drainage bag You should have:  A large overnight bag.  A small leg bag.  Overnight Bag You may wear the overnight bag at any time. Always keep the bag below the level of your bladder but off the floor. When you sleep, put a clean plastic bag in a wastebasket. Then hang the bag inside the wastebasket. Leg Bag Never wear the leg bag at night. Always wear the leg bag below your knee. Keep the leg bag secure with a leg strap or tape. How to care for your skin  Clean the skin around the catheter at least once every day.  Shower every day. Do not take baths.  Put creams, lotions, or ointments on your genital area only as told by your doctor.  Do not use powders, sprays, or lotions on your genital area. How to clean your catheter and your skin 1. Wash your hands with soap and water. 2. Wet a washcloth in warm water and gentle (mild) soap. 3. Use the washcloth to clean the skin where the catheter enters your body. Clean downward and wipe away from the catheter in small circles. Do not wipe toward the catheter. 4. Pat the area dry with a clean towel. Make sure to clean off all soap. How to care for your drainage bags Empty your drainage bag when it is ?- full or at least 2-3 times a day. Replace your drainage bag once a month or sooner if it starts to smell bad or look dirty. Do not clean your drainage bag unless told by your doctor. Emptying a drainage bag  Supplies Needed  Rubbing alcohol.  Gauze pad or cotton ball.  Tape or a leg strap.  Steps 1. Wash your hands with soap and water. 2. Separate (detach) the bag from your leg. 3. Hold the bag over  the toilet or a clean container. Keep the bag below your hips and bladder. This stops pee (urine) from going back into the tube. 4. Open the pour spout at the bottom of the bag. 5. Empty the pee into the toilet or container. Do not let the pour spout touch any surface. 6. Put rubbing alcohol on a gauze pad or cotton ball. 7. Use the gauze pad or cotton ball to clean the pour spout. 8. Close the pour spout. 9. Attach the bag to your leg with tape or a leg strap. 10. Wash your hands.  Changing a drainage bag Supplies Needed  Alcohol wipes.  A clean drainage bag.  Adhesive tape or a leg strap.  Steps 1. Wash your hands with soap and water. 2. Separate the dirty bag from your leg. 3. Pinch the rubber catheter with your fingers so that pee does not spill out. 4. Separate the catheter tube from the drainage tube where these tubes connect (at the connection valve). Do not let the tubes touch any surface. 5. Clean the end of the catheter tube with an alcohol wipe. Use a different alcohol wipe to clean the end of the drainage tube. 6. Connect the catheter tube to the drainage tube of the clean bag. 7. Attach the new bag to  the leg with adhesive tape or a leg strap. 8. Wash your hands.  How to prevent infection and other problems  Never pull on your catheter or try to remove it. Pulling can damage tissue in your body.  Always wash your hands before and after touching your catheter.  If a leg strap gets wet, replace it with a dry one.  Drink enough fluids to keep your pee clear or pale yellow, or as told by your doctor.  Do not let the drainage bag or tubing touch the floor.  Wear cotton underwear.  If you are female, wipe from front to back after you poop (have a bowel movement).  Check on the catheter often to make sure it works and the tubing is not twisted. Get help if:  Your pee is cloudy.  Your pee smells unusually bad.  Your pee is not draining into the bag.  Your  tube gets clogged.  Your catheter starts to leak.  Your bladder feels full. Get help right away if:  You have redness, swelling, or pain where the catheter enters your body.  You have fluid, pus, or a bad smell coming from the area where the catheter enters your body.  The area where the catheter enters your body feels warm.  You have a fever.  You have pain in your: ? Stomach (abdomen). ? Legs. ? Lower back. ? Bladder.  You see blood fill the catheter.  Your pee is pink or red.  You feel sick to your stomach (nauseous).  You throw up (vomit).  You have chills.  Your catheter gets pulled out. This information is not intended to replace advice given to you by your health care provider. Make sure you discuss any questions you have with your health care provider. Document Released: 11/10/2012 Document Revised: 06/13/2016 Document Reviewed: 12/29/2013 Elsevier Interactive Patient Education  2018 [place] Anesthesia, Adult, Care After These instructions provide you with information about caring for yourself after your procedure. Your health care provider may also give you more specific instructions. Your treatment has been planned according to current medical practices, but problems sometimes occur. Call your health care provider if you have any problems or questions after your procedure. What can I expect after the procedure? After the procedure, it is common to have:  Vomiting.  A sore throat.  Mental slowness.  It is common to feel:  Nauseous.  Cold or shivery.  Sleepy.  Tired.  Sore or achy, even in parts of your body where you did not have surgery.  Follow these instructions at home: For at least 24 hours after the procedure:  Do not: ? Participate in activities where you could fall or become injured. ? Drive. ? Use heavy machinery. ? Drink alcohol. ? Take sleeping pills or medicines that cause drowsiness. ? Make important  decisions or sign legal documents. ? Take care of children on your own.  Rest. Eating and drinking  If you vomit, drink water, juice, or soup when you can drink without vomiting.  Drink enough fluid to keep your urine clear or pale yellow.  Make sure you have little or no nausea before eating solid foods.  Follow the diet recommended by your health care provider. General instructions  Have a responsible adult stay with you until you are awake and alert.  Return to your normal activities as told by your health care provider. Ask your health care provider what activities are safe for you.  Take  over-the-counter and prescription medicines only as told by your health care provider.  If you smoke, do not smoke without supervision.  Keep all follow-up visits as told by your health care provider. This is important. Contact a health care provider if:  You continue to have nausea or vomiting at home, and medicines are not helpful.  You cannot drink fluids or start eating again.  You cannot urinate after 8-12 hours.  You develop a skin rash.  You have fever.  You have increasing redness at the site of your procedure. Get help right away if:  You have difficulty breathing.  You have chest pain.  You have unexpected bleeding.  You feel that you are having a life-threatening or urgent problem. This information is not intended to replace advice given to you by your health care provider. Make sure you discuss any questions you have with your health care provider. Document Released: 10/22/2000 Document Revised: 12/19/2015 Document Reviewed: 06/30/2015 Elsevier Interactive Patient Education  [employer].     Follow up with Dr. Alyson Ingles in 7 days for discussion of pathology and voiding trial. Stent to remain in place for 4 weeks per Dr. Alyson Ingles

## 2018-05-05 NOTE — H&P (Signed)
Urology Admission H&P  Chief Complaint: bladder tumor  History of Present Illness: [name] is a 72yo with a hx of gross hematuria who was found to have a large bladder tumor on CT. She underwent cystoscopy which showed a large papillary bladder tumor. No new LUTS. No fevers/chills/sweats  Past Medical History:  Diagnosis Date  . Arthritis   . Asthma    excercise induced  . 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 Chest discomfort   . GERD (gastroesophageal reflux disease)   . Hypertension   . Impaired fasting glucose   . Irritable bowel syndrome   . Osteopenia   . Tinnitus   . Venous insufficiency    Past Surgical History:  Procedure Laterality Date  . ABDOMINAL HYSTERECTOMY    . APPENDECTOMY    . BREAST BIOPSY Right   . CARPAL TUNNEL RELEASE     Bilateral  . COLONOSCOPY N/A 09/15/2014   Procedure: COLONOSCOPY;  Surgeon: Daneil Dolin, MD;  Location[facility];  Service: Endoscopy;  Laterality: N/A;  8:15 AM  . LUMBAR DISC SURGERY    . PARTIAL HYSTERECTOMY      Home Medications:  Current Facility-Administered Medications  Medication Dose Route Frequency Provider Last Rate Last Dose  . ceFAZolin (ANCEF) IVPB 2g/100 mL premix  2 g Intravenous 30 min Pre-Op Lanaiya Lantry, Candee Furbish, MD      . lactated ringers infusion   Intravenous Continuous Lenice Llamas, MD 50 mL/hr at 05/05/18 0947     Allergies:  Allergies  Allergen Reactions  . Verapamil     swelling    Family History  Problem Relation Age of Onset  . Cancer Father        colon cancer; also brother-colon And sister-breast  . Heart disease Father   . Heart disease Mother        at advanced age  . Hypertension Mother   . Hyperlipidemia Mother   . Osteoporosis Mother   . Cancer Brother        liver and colon  . Cancer Sister 11       breast   Social History:  reports that she has never smoked. She has never used smokeless tobacco. She reports that she drinks about 3.0  standard drinks of alcohol per week. She reports that she does not use drugs.  Review of Systems  Genitourinary: Positive for hematuria.  All other systems reviewed and are negative.   Physical Exam:  Vital signs in last 24 hours: Temp:  [98.4 F (36.9 C)] 98.4 F (36.9 C) (10/07 0925) Pulse Rate:  [80] 80 (10/07 0925) Resp:  [20] 20 (10/07 0925) BP: (147)/(98) 147/98 (10/07 0925) SpO2:  [94 %] 94 % (10/07 0925) Weight:  [86.9 kg] 86.9 kg (10/07 0925) Physical Exam  Constitutional: She is oriented to person, place, and time. She appears well-developed and well-nourished.  HENT:  Head: Normocephalic and atraumatic.  Eyes: Pupils are equal, round, and reactive to light. EOM are normal.  Neck: Normal range of motion. No thyromegaly present.  Cardiovascular: Normal rate and regular rhythm.  Respiratory: Effort normal. No respiratory distress.  GI: Soft. She exhibits no distension.  Musculoskeletal: Normal range of motion. She exhibits no edema.  Neurological: She is alert and oriented to person, place, and time.  Skin: Skin is warm and dry.  Psychiatric: She has a normal mood and affect. Her behavior is normal. Judgment and thought content normal.  Laboratory Data:  No results found for this or any previous visit (from the past 24 hour(s)). No results found for this or any previous visit (from the past 240 hour(s)). Creatinine: Recent Labs    04/30/18 1503  CREATININE 1.05*   Baseline Creatinine: 1  Impression/Assessment:  71yo with a bladder tumor  Plan:  The risks/benefits/alternatives to TURBT was explained to the patient and she understands and wishes to proceed with surgery  Nicolette Bang 05/05/2018, 10:08 AM

## 2018-05-05 NOTE — Anesthesia Postprocedure Evaluation (Signed)
Anesthesia Post Note  Patient: ASHRITHA DESROSIERS  Procedure(s) Performed: TRANSURETHRAL RESECTION OF BLADDER TUMOR (TURBT) (N/A ) CYSTOSCOPY WITH BILATERAL RETROGRADE PYELOGRAM/ RIGHT URETERAL STENT PLACEMENT (Bilateral )  Patient location during evaluation: PACU Anesthesia Type: General Level of consciousness: awake and alert and patient cooperative Pain management: satisfactory to patient Vital Signs Assessment: post-procedure vital signs reviewed and stable Respiratory status: spontaneous breathing Cardiovascular status: stable Postop Assessment: no apparent nausea or vomiting Anesthetic complications: no     Last Vitals:  Vitals:   05/05/18 1230 05/05/18 1245  BP: 121/78 127/74  Pulse: (!) 103 97  Resp: 15 12  Temp:    SpO2: 98% 99%    Last Pain:  Vitals:   05/05/18 1245  TempSrc:   PainSc: 0-No pain                 Jebadiah Imperato

## 2018-05-07 ENCOUNTER — Encounter (HOSPITAL_COMMUNITY): Payer: Self-pay | Admitting: Urology

## 2018-05-14 ENCOUNTER — Ambulatory Visit (INDEPENDENT_AMBULATORY_CARE_PROVIDER_SITE_OTHER): Payer: Medicare HMO | Admitting: Urology

## 2018-05-14 DIAGNOSIS — C672 Malignant neoplasm of lateral wall of bladder: Secondary | ICD-10-CM

## 2018-05-15 ENCOUNTER — Other Ambulatory Visit: Payer: Self-pay | Admitting: Urology

## 2018-06-09 ENCOUNTER — Encounter (HOSPITAL_COMMUNITY)
Admission: RE | Admit: 2018-06-09 | Discharge: 2018-06-09 | Disposition: A | Discharge status: Home / Self Care | Payer: Medicare HMO | Source: Ambulatory Visit | Attending: Urology | Admitting: Urology

## 2018-06-16 ENCOUNTER — Ambulatory Visit (HOSPITAL_COMMUNITY)
Admission: RE | Admit: 2018-06-16 | Discharge: 2018-06-16 | Disposition: A | Discharge status: Home / Self Care | Payer: Medicare HMO | Source: Ambulatory Visit | Attending: Urology | Admitting: Urology

## 2018-06-16 ENCOUNTER — Ambulatory Visit (HOSPITAL_COMMUNITY): Payer: Medicare HMO | Admitting: Anesthesiology

## 2018-06-16 ENCOUNTER — Encounter (HOSPITAL_COMMUNITY)
Admission: RE | Disposition: A | Discharge status: Home / Self Care | Payer: Self-pay | Source: Ambulatory Visit | Attending: Urology

## 2018-06-16 ENCOUNTER — Encounter (HOSPITAL_COMMUNITY): Payer: Self-pay | Admitting: Anesthesiology

## 2018-06-16 DIAGNOSIS — Z8551 Personal history of malignant neoplasm of bladder: Secondary | ICD-10-CM | POA: Insufficient documentation

## 2018-06-16 DIAGNOSIS — J45909 Unspecified asthma, uncomplicated: Secondary | ICD-10-CM | POA: Insufficient documentation

## 2018-06-16 DIAGNOSIS — D379 Neoplasm of uncertain behavior of digestive organ, unspecified: Secondary | ICD-10-CM | POA: Diagnosis not present

## 2018-06-16 DIAGNOSIS — M1990 Unspecified osteoarthritis, unspecified site: Secondary | ICD-10-CM | POA: Diagnosis not present

## 2018-06-16 DIAGNOSIS — N3080 Other cystitis without hematuria: Secondary | ICD-10-CM | POA: Insufficient documentation

## 2018-06-16 DIAGNOSIS — I10 Essential (primary) hypertension: Secondary | ICD-10-CM | POA: Insufficient documentation

## 2018-06-16 DIAGNOSIS — Z888 Allergy status to other drugs, medicaments and biological substances status: Secondary | ICD-10-CM | POA: Diagnosis not present

## 2018-06-16 DIAGNOSIS — I872 Venous insufficiency (chronic) (peripheral): Secondary | ICD-10-CM | POA: Insufficient documentation

## 2018-06-16 DIAGNOSIS — K219 Gastro-esophageal reflux disease without esophagitis: Secondary | ICD-10-CM | POA: Diagnosis not present

## 2018-06-16 DIAGNOSIS — D494 Neoplasm of unspecified behavior of bladder: Secondary | ICD-10-CM | POA: Diagnosis not present

## 2018-06-16 DIAGNOSIS — Z8249 Family history of ischemic heart disease and other diseases of the circulatory system: Secondary | ICD-10-CM | POA: Insufficient documentation

## 2018-06-16 DIAGNOSIS — C679 Malignant neoplasm of bladder, unspecified: Secondary | ICD-10-CM | POA: Diagnosis not present

## 2018-06-16 HISTORY — PX: TRANSURETHRAL RESECTION OF BLADDER TUMOR: SHX2575

## 2018-06-16 SURGERY — TURBT (TRANSURETHRAL RESECTION OF BLADDER TUMOR)
Anesthesia: General | Site: Bladder | Laterality: Right

## 2018-06-16 MED ORDER — MIDAZOLAM HCL 5 MG/5ML IJ SOLN
INTRAMUSCULAR | Status: DC | PRN
  Administered 2018-06-16 (×2): 1 mg via INTRAVENOUS

## 2018-06-16 MED ORDER — HYDROMORPHONE HCL 1 MG/ML IJ SOLN: 0.2500 mg | INTRAMUSCULAR | Status: DC | PRN

## 2018-06-16 MED ORDER — HYDROCODONE-ACETAMINOPHEN 7.5-325 MG PO TABS: 1.0000 | ORAL_TABLET | Freq: Once | ORAL | Status: DC | PRN

## 2018-06-16 MED ORDER — PROPOFOL 10 MG/ML IV BOLUS
INTRAVENOUS | Status: AC
  Filled 2018-06-16: qty 20

## 2018-06-16 MED ORDER — MIDAZOLAM HCL 2 MG/2ML IJ SOLN
INTRAMUSCULAR | Status: AC
  Filled 2018-06-16: qty 2

## 2018-06-16 MED ORDER — PROMETHAZINE HCL 25 MG/ML IJ SOLN: 6.2500 mg | INTRAMUSCULAR | Status: DC | PRN

## 2018-06-16 MED ORDER — PROPOFOL 10 MG/ML IV BOLUS
INTRAVENOUS | Status: DC | PRN
  Administered 2018-06-16: 200 mg via INTRAVENOUS

## 2018-06-16 MED ORDER — OXYCODONE-ACETAMINOPHEN 5-325 MG PO TABS: 1.0000 | ORAL_TABLET | ORAL | 0 refills | Status: AC | PRN

## 2018-06-16 MED ORDER — FENTANYL CITRATE (PF) 100 MCG/2ML IJ SOLN
INTRAMUSCULAR | Status: AC
  Filled 2018-06-16: qty 2

## 2018-06-16 MED ORDER — SUCCINYLCHOLINE CHLORIDE 20 MG/ML IJ SOLN
INTRAMUSCULAR | Status: DC | PRN
  Administered 2018-06-16: 40 mg via INTRAVENOUS
  Administered 2018-06-16: 120 mg via INTRAVENOUS

## 2018-06-16 MED ORDER — SODIUM CHLORIDE 0.9 % IV SOLN
2.0000 g | INTRAVENOUS | Status: AC
  Administered 2018-06-16: 2 g via INTRAVENOUS
  Filled 2018-06-16: qty 20

## 2018-06-16 MED ORDER — MIDAZOLAM HCL 2 MG/2ML IJ SOLN: 0.5000 mg | Freq: Once | INTRAMUSCULAR | Status: DC | PRN

## 2018-06-16 MED ORDER — LACTATED RINGERS IV SOLN
INTRAVENOUS | Status: DC
  Administered 2018-06-16: 09:00:00 via INTRAVENOUS

## 2018-06-16 MED ORDER — FENTANYL CITRATE (PF) 100 MCG/2ML IJ SOLN
INTRAMUSCULAR | Status: DC | PRN
  Administered 2018-06-16: 50 ug via INTRAVENOUS

## 2018-06-16 MED ORDER — SODIUM CHLORIDE 0.9 % IR SOLN
Status: DC | PRN
  Administered 2018-06-16 (×2): 3000 mL via INTRAVESICAL

## 2018-06-16 MED ORDER — STERILE WATER FOR IRRIGATION IR SOLN
Status: DC | PRN
  Administered 2018-06-16: 1000 mL

## 2018-06-16 MED ORDER — OXYBUTYNIN CHLORIDE ER 10 MG PO TB24: 10.0000 mg | ORAL_TABLET | Freq: Every day | ORAL | 1 refills | Status: AC

## 2018-06-16 SURGICAL SUPPLY — 25 items
BAG DRAIN URO TABLE W/ADPT NS (BAG) ×4 IMPLANT
BAG URINE DRAINAGE (UROLOGICAL SUPPLIES) ×4 IMPLANT
CATH FOLEY 3WAY 30CC 22F (CATHETERS) ×4 IMPLANT
CATH FOLEY LATEX FREE 22FR (CATHETERS)
CATH FOLEY LF 22FR (CATHETERS) IMPLANT
CLOTH BEACON ORANGE TIMEOUT ST (SAFETY) ×4 IMPLANT
ELECT LOOP 22F BIPOLAR SML (ELECTROSURGICAL) ×4
ELECT REM PT RETURN 9FT ADLT (ELECTROSURGICAL) ×4
ELECTRODE LOOP 22F BIPOLAR SML (ELECTROSURGICAL) ×2 IMPLANT
ELECTRODE REM PT RTRN 9FT ADLT (ELECTROSURGICAL) ×2 IMPLANT
GLOVE BIO SURGEON STRL SZ8 (GLOVE) ×4 IMPLANT
GLOVE BIOGEL PI IND STRL 7.0 (GLOVE) ×4 IMPLANT
GLOVE BIOGEL PI INDICATOR 7.0 (GLOVE) ×4
GOWN STRL REUS W/ TWL LRG LVL3 (GOWN DISPOSABLE) ×2 IMPLANT
GOWN STRL REUS W/TWL LRG LVL3 (GOWN DISPOSABLE) ×6 IMPLANT
GOWN STRL REUS W/TWL XL LVL3 (GOWN DISPOSABLE) ×4 IMPLANT
IV NS IRRIG 3000ML ARTHROMATIC (IV SOLUTION) ×8 IMPLANT
KIT TURNOVER CYSTO (KITS) ×4 IMPLANT
PACK CYSTO (CUSTOM PROCEDURE TRAY) ×4 IMPLANT
PAD ARMBOARD 7.5X6 YLW CONV (MISCELLANEOUS) ×4 IMPLANT
PLUG CATH AND CAP STER (CATHETERS) ×4 IMPLANT
SYR 30ML LL (SYRINGE) ×4 IMPLANT
SYRINGE IRR TOOMEY STRL 70CC (SYRINGE) ×4 IMPLANT
TOWEL OR 17X26 4PK STRL BLUE (TOWEL DISPOSABLE) ×4 IMPLANT
WATER STERILE IRR 500ML POUR (IV SOLUTION) ×4 IMPLANT

## 2018-06-16 NOTE — Addendum Note (Signed)
Addendum  created 06/16/18 1344 by Charmaine Downs, CRNA   Intraprocedure Staff edited

## 2018-06-16 NOTE — Op Note (Signed)
.  Preoperative diagnosis: bladder tumor  Postoperative diagnosis: Same  Procedure: 1 cystoscopy 2.  Transurethral resection of bladder tumor, small  Attending: Rosie Fate  Anesthesia: General  Estimated blood loss: Minimal  Drains: 22 French foley  Specimens: right lateral wall bladder tumor  Antibiotics: ancef  Findings: 1cm papillary right lateral wall tumor.  Ureteral orifices in normal anatomic location.   Indications: Patient is a 72 year old female with a history of high grade bladder cancer here for repeat resection.  After discussing treatment options, they decided proceed with transurethral resection of a bladder tumor.  Procedure her in detail: The patient was brought to the operating room and a brief timeout was done to ensure correct patient, correct procedure, correct site.  General anesthesia was administered patient was placed in dorsal lithotomy position.  Their genitalia was then prepped and draped in usual sterile fashion.  A rigid 81 French cystoscope was passed in the urethra and the bladder.  Bladder was inspected and we noted a 1cm bladder tumor.  the ureteral orifices were in the normal orthotopic locations. Using the bipolar resectoscope we removed the bladder tumor down to the base. A subsequent muscle deep biopsy was then taken. Hemostasis was then obtained with electrocautery. We then removed the bladder tumor chips and sent them for pathology. We then re-inspected the bladder and found no residula bleeding.  the bladder was then drained, a 22 French foley was placed and this concluded the procedure which was well tolerated by patient.  Complications: None  Condition: Stable, extubated, transferred to PACU  Plan: Patient is to be discharged home and followup in 5 days for foley catheter removal and pathology discussion.

## 2018-06-16 NOTE — Anesthesia Preprocedure Evaluation (Addendum)
Anesthesia Evaluation  Patient identified by MRN, date of birth, ID band Patient awake    Reviewed: Allergy & Precautions, NPO status , Patient's Chart, lab work & pertinent test results  Airway Mallampati: II  TM Distance: >3 FB Neck ROM: Full    Dental no notable dental hx. (+) Teeth Intact   Pulmonary asthma ,    Pulmonary exam normal breath sounds clear to auscultation       Cardiovascular Exercise Tolerance: Good hypertension, Pt. on medications negative cardio ROS Normal cardiovascular examI Rhythm:Regular Rate:Normal     Neuro/Psych negative neurological ROS  negative psych ROS   GI/Hepatic Neg liver ROS, GERD  Medicated and Controlled,  Endo/Other  negative endocrine ROS  Renal/GU negative Renal ROSBladder Ca- here for Bx and stent change today  negative genitourinary   Musculoskeletal  (+) Arthritis ,   Abdominal   Peds negative pediatric ROS (+)  Hematology negative hematology ROS (+)   Anesthesia Other Findings   Reproductive/Obstetrics negative OB ROS                             Anesthesia Physical Anesthesia Plan  ASA: II  Anesthesia Plan: General   Post-op Pain Management:    Induction: Intravenous  PONV Risk Score and Plan:   Airway Management Planned: Oral ETT  Additional Equipment:   Intra-op Plan:   Post-operative Plan: Extubation in OR  Informed Consent: I have reviewed the patients History and Physical, chart, labs and discussed the procedure including the risks, benefits and alternatives for the proposed anesthesia with the patient or authorized representative who has indicated his/her understanding and acceptance.   Dental advisory given  Plan Discussed with: CRNA  Anesthesia Plan Comments: (ETT  Requested by Urologist - b/c location of tumor )      Anesthesia Quick Evaluation

## 2018-06-16 NOTE — Anesthesia Procedure Notes (Addendum)
Procedure Name: Intubation Date/Time: 06/16/2018 10:00 AM Performed by: Charmaine Downs, CRNA Pre-anesthesia Checklist: Patient identified, Patient being monitored, Timeout performed, Emergency Drugs available and Suction available Patient Re-evaluated:Patient Re-evaluated prior to induction Oxygen Delivery Method: Circle System Utilized Preoxygenation: Pre-oxygenation with 100% oxygen Induction Type: IV induction Ventilation: Mask ventilation without difficulty Laryngoscope Size: Mac and 3 Grade View: Grade II Tube type: Oral Tube size: 7.0 mm Number of attempts: 1 Airway Equipment and Method: stylet Placement Confirmation: ETT inserted through vocal cords under direct vision,  positive ETCO2 and breath sounds checked- equal and bilateral Secured at: 22 cm Tube secured with: Tape Dental Injury: Teeth and Oropharynx as per pre-operative assessment

## 2018-06-16 NOTE — Anesthesia Postprocedure Evaluation (Signed)
Anesthesia Post Note  Patient: Kayla [name]  Procedure(s) Performed: TRANSURETHRAL RESECTION OF BLADDER TUMOR (TURBT) (N/A Bladder)  Patient location during evaluation: PACU Anesthesia Type: General Level of consciousness: awake and patient cooperative Pain management: pain level controlled Vital Signs Assessment: post-procedure vital signs reviewed and stable Respiratory status: spontaneous breathing, nonlabored ventilation and respiratory function stable Cardiovascular status: blood pressure returned to baseline Postop Assessment: no apparent nausea or vomiting Anesthetic complications: no     Last Vitals:  Vitals:   06/16/18 0852 06/16/18 1040  BP: 140/84   Pulse: 67   Resp: 18   Temp: 36.7 C 36.7 C  SpO2: 94%     Last Pain:  Vitals:   06/16/18 0852  TempSrc: Oral  PainSc: 4                  Coy Rochford J

## 2018-06-16 NOTE — Transfer of Care (Signed)
Immediate Anesthesia Transfer of Care Note  Patient: Kayla [name]  Procedure(s) Performed: TRANSURETHRAL RESECTION OF BLADDER TUMOR (TURBT) (N/A Bladder)  Patient Location: PACU  Anesthesia Type:General  Level of Consciousness: awake and patient cooperative  Airway & Oxygen Therapy: Patient Spontanous Breathing  Post-op Assessment: Report given to RN, Post -op Vital signs reviewed and stable and Patient moving all extremities  Post vital signs: Reviewed and stable  Last Vitals:  Vitals Value Taken Time  BP    Temp    Pulse    Resp    SpO2      Last Pain:  Vitals:   06/16/18 0852  TempSrc: Oral  PainSc: 4       Patients Stated Pain Goal: 6 (16/60/63 0160)  Complications: No apparent anesthesia complications

## 2018-06-16 NOTE — H&P (Signed)
Urology Admission H&P  Chief Complaint: bladder cancer  History of Present Illness: Kayla [name] is a 72yo with a hx of high grade bladder cancer here for repeat resection. She has a stent in place. She has urinary frequency, urgency and occasional dysuria with the stent in place. No recent hematuria. No fevers/chills/sweats  Past Medical History:  Diagnosis Date  . Arthritis   . Asthma    excercise induced  . Benign brain tumor ([place]) 2002   Treated at [facility] with gamma knife  . Chest discomfort   . GERD (gastroesophageal reflux disease)   . Hypertension   . Impaired fasting glucose   . Irritable bowel syndrome   . Osteopenia   . Tinnitus   . Venous insufficiency    Past Surgical History:  Procedure Laterality Date  . ABDOMINAL HYSTERECTOMY    . APPENDECTOMY    . BREAST BIOPSY Right   . CARPAL TUNNEL RELEASE     Bilateral  . COLONOSCOPY N/A 09/15/2014   Procedure: COLONOSCOPY;  Surgeon: Daneil Dolin, MD;  Location[facility];  Service: Endoscopy;  Laterality: N/A;  8:15 AM  . CYSTOSCOPY W/ URETERAL STENT PLACEMENT Bilateral 05/05/2018   Procedure: CYSTOSCOPY WITH BILATERAL RETROGRADE PYELOGRAM/ RIGHT URETERAL STENT PLACEMENT;  Surgeon: Cleon Gustin, MD;  Location: [facility];  Service: Urology;  Laterality: Bilateral;  . LUMBAR DISC SURGERY    . PARTIAL HYSTERECTOMY    . TRANSURETHRAL RESECTION OF BLADDER TUMOR N/A 05/05/2018   Procedure: TRANSURETHRAL RESECTION OF BLADDER TUMOR (TURBT);  Surgeon: Cleon Gustin, MD;  Location: [facility];  Service: Urology;  Laterality: N/A;    Home Medications:  Current Facility-Administered Medications  Medication Dose Route Frequency Provider Last Rate Last Dose  . cefTRIAXone (ROCEPHIN) 2 g in sodium chloride 0.9 % 100 mL IVPB  2 g Intravenous 30 min Pre-Op McKenzie, Candee Furbish, MD      . lactated ringers infusion   Intravenous Continuous Lenice Llamas, MD 50 mL/hr at 06/16/18 1950     Allergies:  Allergies  Allergen  Reactions  . Verapamil Swelling    Family History  Problem Relation Age of Onset  . Cancer Father        colon cancer; also brother-colon And sister-breast  . Heart disease Father   . Heart disease Mother        at advanced age  . Hypertension Mother   . Hyperlipidemia Mother   . Osteoporosis Mother   . Cancer Brother        liver and colon  . Cancer Sister 87       breast   Social History:  reports that she has never smoked. She has never used smokeless tobacco. She reports that she drinks about 3.0 standard drinks of alcohol per week. She reports that she does not use drugs.  Review of Systems  All other systems reviewed and are negative.   Physical Exam:  Vital signs in last 24 hours: Temp:  [98.1 F (36.7 C)] 98.1 F (36.7 C) (11/18 0852) Pulse Rate:  [67] 67 (11/18 0852) Resp:  [18] 18 (11/18 0852) BP: (140)/(84) 140/84 (11/18 0852) SpO2:  [94 %] 94 % (11/18 0852) Weight:  [83.9 kg] 83.9 kg (11/18 0852) Physical Exam  Constitutional: She is oriented to person, place, and time. She appears well-developed and well-nourished.  HENT:  Head: Normocephalic and atraumatic.  Eyes: Pupils are equal, round, and reactive to light. EOM are normal.  Neck: Normal range of motion. No  thyromegaly present.  Cardiovascular: Normal rate and regular rhythm.  Respiratory: Effort normal. No respiratory distress.  GI: Soft. She exhibits no distension.  Musculoskeletal: Normal range of motion. She exhibits no edema.  Neurological: She is alert and oriented to person, place, and time.  Skin: Skin is warm and dry.  Psychiatric: She has a normal mood and affect. Her behavior is normal. Judgment and thought content normal.    Laboratory Data:  No results found for this or any previous visit (from the past 24 hour(s)). No results found for this or any previous visit (from the past 240 hour(s)). Creatinine: No results for input(s): CREATININE in the last 168 hours. Baseline Creatinine:  unknwon  Impression/Assessment:  72yo with high grade bladder cancer  Plan:  The risks/benefits/alternatives to TURBT was explained to the patient and she understands and wishes to proceed with surgery  Kayla [name] 06/16/2018, 9:13 AM

## 2018-06-16 NOTE — Discharge Instructions (Signed)
Ureteral Stent Implantation, Care After Refer to this sheet in the next few weeks. These instructions provide you with information about caring for yourself after your procedure. Your health care provider may also give you more specific instructions. Your treatment has been planned according to current medical practices, but problems sometimes occur. Call your health care provider if you have any problems or questions after your procedure. What can I expect after the procedure? After the procedure, it is common to have:  Nausea.  Mild pain when you urinate. You may feel this pain in your lower back or lower abdomen. Pain should stop within a few minutes after you urinate. This may last for up to 1 week.  A small amount of blood in your urine for several days.  Follow these instructions at home:  Medicines  Take over-the-counter and prescription medicines only as told by your health care provider.  If you were prescribed an antibiotic medicine, take it as told by your health care provider. Do not stop taking the antibiotic even if you start to feel better.  Do not drive for 24 hours if you received a sedative.  Do not drive or operate heavy machinery while taking prescription pain medicines. Activity  Return to your normal activities as told by your health care provider. Ask your health care provider what activities are safe for you.  Do not lift anything that is heavier than 10 lb (4.5 kg). Follow this limit for 1 week after your procedure, or for as long as told by your health care provider. General instructions  Watch for any blood in your urine. Call your health care provider if the amount of blood in your urine increases.  If you have a catheter: ? Follow instructions from your health care provider about taking care of your catheter and collection bag. ? Do not take baths, swim, or use a hot tub until your health care provider approves.  Drink enough fluid to keep your urine  clear or pale yellow.  Keep all follow-up visits as told by your health care provider. This is important. Contact a health care provider if:  You have pain that gets worse or does not get better with medicine, especially pain when you urinate.  You have difficulty urinating.  You feel nauseous or you vomit repeatedly during a period of more than 2 days after the procedure. Get help right away if:  Your urine is dark red or has blood clots in it.  You are leaking urine (have incontinence).  The end of the stent comes out of your urethra.  You cannot urinate.  You have sudden, sharp, or severe pain in your abdomen or lower back.  You have a fever. This information is not intended to replace advice given to you by your health care provider. Make sure you discuss any questions you have with your health care provider. Document Released: 03/18/2013 Document Revised: 12/22/2015 Document Reviewed: 01/28/2015 Elsevier Interactive Patient Education  2018 [place], Adult Take good care of your catheter to keep it working and to prevent problems. How to wear your catheter Attach your catheter to your leg with tape (adhesive tape) or a leg strap. Make sure it is not too tight. If you use tape, remove any bits of tape that are already on the catheter. How to wear a drainage bag You should have:  A large overnight bag.  A small leg bag.  Overnight Bag You may wear  the overnight bag at any time. Always keep the bag below the level of your bladder but off the floor. When you sleep, put a clean plastic bag in a wastebasket. Then hang the bag inside the wastebasket. Leg Bag Never wear the leg bag at night. Always wear the leg bag below your knee. Keep the leg bag secure with a leg strap or tape. How to care for your skin  Clean the skin around the catheter at least once every day.  Shower every day. Do not take baths.  Put creams, lotions, or  ointments on your genital area only as told by your doctor.  Do not use powders, sprays, or lotions on your genital area. How to clean your catheter and your skin 1. Wash your hands with soap and water. 2. Wet a washcloth in warm water and gentle (mild) soap. 3. Use the washcloth to clean the skin where the catheter enters your body. Clean downward and wipe away from the catheter in small circles. Do not wipe toward the catheter. 4. Pat the area dry with a clean towel. Make sure to clean off all soap. How to care for your drainage bags Empty your drainage bag when it is ?- full or at least 2-3 times a day. Replace your drainage bag once a month or sooner if it starts to smell bad or look dirty. Do not clean your drainage bag unless told by your doctor. Emptying a drainage bag  Supplies Needed  Rubbing alcohol.  Gauze pad or cotton ball.  Tape or a leg strap.  Steps 1. Wash your hands with soap and water. 2. Separate (detach) the bag from your leg. 3. Hold the bag over the toilet or a clean container. Keep the bag below your hips and bladder. This stops pee (urine) from going back into the tube. 4. Open the pour spout at the bottom of the bag. 5. Empty the pee into the toilet or container. Do not let the pour spout touch any surface. 6. Put rubbing alcohol on a gauze pad or cotton ball. 7. Use the gauze pad or cotton ball to clean the pour spout. 8. Close the pour spout. 9. Attach the bag to your leg with tape or a leg strap. 10. Wash your hands.  Changing a drainage bag Supplies Needed  Alcohol wipes.  A clean drainage bag.  Adhesive tape or a leg strap.  Steps 1. Wash your hands with soap and water. 2. Separate the dirty bag from your leg. 3. Pinch the rubber catheter with your fingers so that pee does not spill out. 4. Separate the catheter tube from the drainage tube where these tubes connect (at the connection valve). Do not let the tubes touch any surface. 5. Clean  the end of the catheter tube with an alcohol wipe. Use a different alcohol wipe to clean the end of the drainage tube. 6. Connect the catheter tube to the drainage tube of the clean bag. 7. Attach the new bag to the leg with adhesive tape or a leg strap. 8. Wash your hands.  How to prevent infection and other problems  Never pull on your catheter or try to remove it. Pulling can damage tissue in your body.  Always wash your hands before and after touching your catheter.  If a leg strap gets wet, replace it with a dry one.  Drink enough fluids to keep your pee clear or pale yellow, or as told by your doctor.  Do not  let the drainage bag or tubing touch the floor.  Wear cotton underwear.  If you are female, wipe from front to back after you poop (have a bowel movement).  Check on the catheter often to make sure it works and the tubing is not twisted. Get help if:  Your pee is cloudy.  Your pee smells unusually bad.  Your pee is not draining into the bag.  Your tube gets clogged.  Your catheter starts to leak.  Your bladder feels full. Get help right away if:  You have redness, swelling, or pain where the catheter enters your body.  You have fluid, pus, or a bad smell coming from the area where the catheter enters your body.  The area where the catheter enters your body feels warm.  You have a fever.  You have pain in your: ? Stomach (abdomen). ? Legs. ? Lower back. ? Bladder.  You see blood fill the catheter.  Your pee is pink or red.  You feel sick to your stomach (nauseous).  You throw up (vomit).  You have chills.  Your catheter gets pulled out. This information is not intended to replace advice given to you by your health care provider. Make sure you discuss any questions you have with your health care provider. Document Released: 11/10/2012 Document Revised: 06/13/2016 Document Reviewed: 12/29/2013 Elsevier Interactive Patient Education  2018  [place] [facility], Adult, Care After These instructions provide you with information about caring for yourself after your procedure. Your health care provider may also give you more specific instructions. Your treatment has been planned according to current medical practices, but problems sometimes occur. Call your health care provider if you have any problems or questions after your procedure. What can I expect after the procedure? After the procedure, it is common to have:  Vomiting.  A sore throat.  Mental slowness.  It is common to feel:  Nauseous.  Cold or shivery.  Sleepy.  Tired.  Sore or achy, even in parts of your body where you did not have surgery.  Follow these instructions at home: For at least 24 hours after the procedure:  Do not: ? Participate in activities where you could fall or become injured. ? Drive. ? Use heavy machinery. ? Drink alcohol. ? Take sleeping pills or medicines that cause drowsiness. ? Make important decisions or sign legal documents. ? Take care of children on your own.  Rest. Eating and drinking  If you vomit, drink water, juice, or soup when you can drink without vomiting.  Drink enough fluid to keep your urine clear or pale yellow.  Make sure you have little or no nausea before eating solid foods.  Follow the diet recommended by your health care provider. General instructions  Have a responsible adult stay with you until you are awake and alert.  Return to your normal activities as told by your health care provider. Ask your health care provider what activities are safe for you.  Take over-the-counter and prescription medicines only as told by your health care provider.  If you smoke, do not smoke without supervision.  Keep all follow-up visits as told by your health care provider. This is important. Contact a health care provider if:  You continue to have nausea or vomiting at home, and medicines  are not helpful.  You cannot drink fluids or start eating again.  You cannot urinate after 8-12 hours.  You develop a skin rash.  You have fever.  You have increasing redness at the site of your procedure. Get help right away if:  You have difficulty breathing.  You have chest pain.  You have unexpected bleeding.  You feel that you are having a life-threatening or urgent problem. This information is not intended to replace advice given to you by your health care provider. Make sure you discuss any questions you have with your health care provider. Document Released: 10/22/2000 Document Revised: 12/19/2015 Document Reviewed: 06/30/2015 Elsevier Interactive Patient Education  [employer].

## 2018-06-16 NOTE — Anesthesia Procedure Notes (Signed)
Performed by: Khaliah Barnick J, CRNA       

## 2018-06-17 ENCOUNTER — Encounter (HOSPITAL_COMMUNITY): Payer: Self-pay | Admitting: Urology

## 2018-06-25 ENCOUNTER — Ambulatory Visit (INDEPENDENT_AMBULATORY_CARE_PROVIDER_SITE_OTHER): Payer: Medicare HMO | Admitting: Urology

## 2018-06-25 DIAGNOSIS — C672 Malignant neoplasm of lateral wall of bladder: Secondary | ICD-10-CM

## 2018-08-04 ENCOUNTER — Encounter: Payer: Self-pay | Admitting: Family Medicine

## 2018-08-04 ENCOUNTER — Ambulatory Visit (INDEPENDENT_AMBULATORY_CARE_PROVIDER_SITE_OTHER): Payer: Medicare HMO | Admitting: Family Medicine

## 2018-08-04 VITALS — BP 128/82 | Ht 67.0 in | Wt 187.0 lb

## 2018-08-04 DIAGNOSIS — I10 Essential (primary) hypertension: Secondary | ICD-10-CM

## 2018-08-04 DIAGNOSIS — R7303 Prediabetes: Secondary | ICD-10-CM

## 2018-08-04 DIAGNOSIS — Z0000 Encounter for general adult medical examination without abnormal findings: Secondary | ICD-10-CM | POA: Diagnosis not present

## 2018-08-04 DIAGNOSIS — E781 Pure hyperglyceridemia: Secondary | ICD-10-CM | POA: Diagnosis not present

## 2018-08-04 MED ORDER — HYDROCHLOROTHIAZIDE 12.5 MG PO TABS: 12.5000 mg | ORAL_TABLET | Freq: Every day | ORAL | 1 refills | Status: DC

## 2018-08-04 MED ORDER — LISINOPRIL 10 MG PO TABS: 10.0000 mg | ORAL_TABLET | Freq: Every day | ORAL | 1 refills | Status: DC

## 2018-08-04 NOTE — Progress Notes (Signed)
Subjective:    Patient ID: Kayla [name], female    DOB: [birth year], 73 y.o.   MRN: [id number]  HPI AWV- Annual Wellness Visit  The patient was seen for their annual wellness visit. The patient's past medical history, surgical history, and family history were reviewed. Pertinent vaccines were reviewed ( tetanus, pneumonia, shingles, flu) The patient's medication list was reviewed and updated.  The height and weight were entered.  BMI recorded in electronic record elsewhere  Cognitive screening was completed. Outcome of Mini - Cog: pass   Falls /depression screening electronically recorded within record elsewhere  Current tobacco usage: none (All patients who use tobacco were given written and verbal information on quitting)  Recent listing of emergency department/hospitalizations over the past year were reviewed.  current specialist the patient sees on a regular basis: none   Medicare annual wellness visit patient questionnaire was reviewed.  A written screening schedule for the patient for the next 5-10 years was given. Appropriate discussion of followup regarding next visit was discussed.  Pt states last two mammograms she was sent to do biopsy. And was unable to lift her arm for 6 months afterwards. Declines mammogram today.   Starting treatment for bladder cancer tomorrow. Doing well since surgery.   Hypertension: Reports taking meds differently, taking 1/2 tablet of each HCTZ and Lisinopril for the last 9 months per patient. BP at home has been 120s/80s. Denies any adverse effects.   Is working on diet, cutting back on sugar. Up and down stairs at home, no regular exercise.   Declines shingles vaccine. Declines mammogram.   Denies sexual activity. Denies vaginal problems. Hx of hysterectomy.     Review of Systems  Constitutional: Negative for chills, fatigue, fever and unexpected weight change.  HENT: Negative for congestion, ear pain, sinus pressure, sinus  pain and sore throat.   Eyes: Negative for discharge and visual disturbance.  Respiratory: Negative for cough, shortness of breath and wheezing.   Cardiovascular: Negative for chest pain and leg swelling.  Gastrointestinal: Negative for abdominal pain, blood in stool, constipation, diarrhea, nausea and vomiting.  Genitourinary: Negative for pelvic pain, vaginal bleeding and vaginal discharge.  Neurological: Negative for dizziness, weakness, light-headedness and headaches.  Hematological: Negative for adenopathy.  Psychiatric/Behavioral: Negative for dysphoric mood and suicidal ideas.  All other systems reviewed and are negative.      Objective:   Physical Exam Vitals signs and nursing note reviewed. Exam conducted with a chaperone present.  Constitutional:      General: She is not in acute distress.    Appearance: Normal appearance. She is well-developed.  HENT:     Head: Normocephalic and atraumatic.     Right Ear: Tympanic membrane normal.     Left Ear: Tympanic membrane normal.     Nose: Nose normal.     Mouth/Throat:     Mouth: Mucous membranes are moist.     Pharynx: Oropharynx is clear. Uvula midline.  Eyes:     General:        Right eye: No discharge.        Left eye: No discharge.     Conjunctiva/sclera: Conjunctivae normal.     Pupils: Pupils are equal, round, and reactive to light.  Neck:     Musculoskeletal: Neck supple.     Thyroid: No thyromegaly.  Cardiovascular:     Rate and Rhythm: Normal rate and regular rhythm.     Heart sounds: Normal heart sounds. No murmur.  Pulmonary:  Effort: Pulmonary effort is normal. No respiratory distress.     Breath sounds: Normal breath sounds. No wheezing.  Chest:     Breasts: Breasts are symmetrical.        Right: Normal.        Left: Normal.  Abdominal:     General: Bowel sounds are normal. There is no distension.     Palpations: Abdomen is soft. There is no mass.     Tenderness: There is no abdominal tenderness.    Genitourinary:    General: Normal vulva.     Labia:        Right: No rash, tenderness or lesion.        Left: No rash, tenderness or lesion.      Vagina: Normal. No tenderness.  Musculoskeletal:        General: No deformity.  Lymphadenopathy:     Cervical: No cervical adenopathy.     Upper Body:     Right upper body: No supraclavicular or axillary adenopathy.     Left upper body: No supraclavicular or axillary adenopathy.  Skin:    General: Skin is warm and dry.  Neurological:     Mental Status: She is alert and oriented to person, place, and time.     Coordination: Coordination normal.           Assessment & Plan:  1. Routine general medical examination at a health care facility - Plan: Lipid panel, Hepatic function panel, Basic metabolic panel, Hemoglobin A1c Adult wellness-complete.wellness physical was conducted today. Importance of diet and exercise were discussed in detail.  In addition to this a discussion regarding safety was also covered. We also reviewed over immunizations and gave recommendations regarding current immunization needed for age.   -Declines shingles vaccine. In addition to this additional areas were also touched on including: Preventative health exams needed:  Colonoscopy UTD Mammogram needed, pt declines at this time. Educated on importance of getting this done regularly, will notify us if she changes her mind. Pap Smear: N/A  Patient was advised yearly wellness exam. Lab work ordered, will notify of results.  2. Essential hypertension - Plan: Basic metabolic panel Well controlled on current regimen. Pt reports has been taking 1/2 tablet of both HCTZ and lisinopril for several months now and is requesting change in dose on rx. Will make these changes, she is to keep track of BP at home and notify us if become elevated. Encouraged healthy diet and exercise. Lab work ordered, f/u in 6 months.  3. Hypertriglyceridemia - Plan: Lipid panel Will check  lipid panel today.  4. Prediabetes - Plan: Hemoglobin A1c Will check A1c today.

## 2018-08-05 ENCOUNTER — Ambulatory Visit (INDEPENDENT_AMBULATORY_CARE_PROVIDER_SITE_OTHER): Payer: Medicare HMO | Admitting: Urology

## 2018-08-05 ENCOUNTER — Other Ambulatory Visit (HOSPITAL_COMMUNITY)
Admission: RE | Admit: 2018-08-05 | Discharge: 2018-08-05 | Disposition: A | Discharge status: Home / Self Care | Payer: Medicare HMO | Source: Ambulatory Visit | Attending: Family Medicine | Admitting: Family Medicine

## 2018-08-05 DIAGNOSIS — C672 Malignant neoplasm of lateral wall of bladder: Secondary | ICD-10-CM

## 2018-08-05 DIAGNOSIS — R310 Gross hematuria: Secondary | ICD-10-CM | POA: Diagnosis not present

## 2018-08-05 LAB — HEPATIC FUNCTION PANEL
ALK PHOS: 64 IU/L (ref 39–117)
ALT: 30 IU/L (ref 0–32)
AST: 26 IU/L (ref 0–40)
Albumin: 4.8 g/dL (ref 3.5–4.8)
Bilirubin Total: 0.5 mg/dL (ref 0.0–1.2)
Bilirubin, Direct: 0.13 mg/dL (ref 0.00–0.40)
TOTAL PROTEIN: 7 g/dL (ref 6.0–8.5)

## 2018-08-05 LAB — BASIC METABOLIC PANEL
BUN/Creatinine Ratio: 22 (ref 12–28)
BUN: 18 mg/dL (ref 8–27)
CHLORIDE: 98 mmol/L (ref 96–106)
CO2: 22 mmol/L (ref 20–29)
CREATININE: 0.83 mg/dL (ref 0.57–1.00)
Calcium: 9.4 mg/dL (ref 8.7–10.3)
GFR calc Af Amer: 80 mL/min/{1.73_m2} (ref >59)
GFR calc non Af Amer: 70 mL/min/{1.73_m2} (ref >59)
GLUCOSE: 80 mg/dL (ref 65–99)
POTASSIUM: 4.4 mmol/L (ref 3.5–5.2)
SODIUM: 141 mmol/L (ref 134–144)

## 2018-08-05 LAB — LIPID PANEL
Chol/HDL Ratio: 4.3 ratio (ref 0.0–4.4)
Cholesterol, Total: 211 mg/dL — ABNORMAL HIGH (ref 100–199)
HDL: 49 mg/dL (ref >39)
LDL Calculated: 114 mg/dL — ABNORMAL HIGH (ref 0–99)
Triglycerides: 240 mg/dL — ABNORMAL HIGH (ref 0–149)
VLDL CHOLESTEROL CAL: 48 mg/dL — AB (ref 5–40)

## 2018-08-05 LAB — HEMOGLOBIN A1C
Est. average glucose Bld gHb Est-mCnc: 120 mg/dL
HEMOGLOBIN A1C: 5.8 % — AB (ref 4.8–5.6)

## 2018-08-07 LAB — URINE CULTURE: Culture: >=100000 — AB

## 2018-08-08 ENCOUNTER — Other Ambulatory Visit: Payer: Self-pay | Admitting: *Deleted

## 2018-08-08 MED ORDER — NITROFURANTOIN MONOHYD MACRO 100 MG PO CAPS: 100.0000 mg | ORAL_CAPSULE | Freq: Two times a day (BID) | ORAL | 0 refills | Status: DC

## 2018-08-12 ENCOUNTER — Ambulatory Visit (INDEPENDENT_AMBULATORY_CARE_PROVIDER_SITE_OTHER): Payer: Medicare HMO | Admitting: Urology

## 2018-08-12 DIAGNOSIS — C672 Malignant neoplasm of lateral wall of bladder: Secondary | ICD-10-CM

## 2018-08-19 ENCOUNTER — Ambulatory Visit (INDEPENDENT_AMBULATORY_CARE_PROVIDER_SITE_OTHER): Payer: Medicare HMO | Admitting: Urology

## 2018-08-19 DIAGNOSIS — C672 Malignant neoplasm of lateral wall of bladder: Secondary | ICD-10-CM

## 2018-08-26 ENCOUNTER — Ambulatory Visit (INDEPENDENT_AMBULATORY_CARE_PROVIDER_SITE_OTHER): Payer: Medicare HMO | Admitting: Urology

## 2018-08-26 DIAGNOSIS — C672 Malignant neoplasm of lateral wall of bladder: Secondary | ICD-10-CM

## 2018-09-02 ENCOUNTER — Ambulatory Visit (INDEPENDENT_AMBULATORY_CARE_PROVIDER_SITE_OTHER): Payer: Medicare HMO | Admitting: Urology

## 2018-09-02 DIAGNOSIS — C672 Malignant neoplasm of lateral wall of bladder: Secondary | ICD-10-CM | POA: Diagnosis not present

## 2018-09-09 ENCOUNTER — Ambulatory Visit (INDEPENDENT_AMBULATORY_CARE_PROVIDER_SITE_OTHER): Payer: Medicare HMO | Admitting: Urology

## 2018-09-09 DIAGNOSIS — C672 Malignant neoplasm of lateral wall of bladder: Secondary | ICD-10-CM

## 2018-09-17 ENCOUNTER — Ambulatory Visit (INDEPENDENT_AMBULATORY_CARE_PROVIDER_SITE_OTHER): Payer: Medicare HMO | Admitting: Urology

## 2018-09-17 DIAGNOSIS — C672 Malignant neoplasm of lateral wall of bladder: Secondary | ICD-10-CM

## 2018-09-24 ENCOUNTER — Ambulatory Visit: Payer: Medicare HMO | Admitting: Urology

## 2018-10-02 ENCOUNTER — Telehealth: Payer: Self-pay | Admitting: Family Medicine

## 2018-10-02 NOTE — Telephone Encounter (Signed)
Pt needs a letter stating she can not go to jury duty due to having bladder cancer that she can not sit through a long trial.

## 2018-10-03 NOTE — Telephone Encounter (Signed)
Patient advised the letter will be available for pick up on Monday

## 2018-10-03 NOTE — Telephone Encounter (Signed)
Will be done by Monday send this bk to me plz

## 2018-10-06 NOTE — Telephone Encounter (Signed)
Patient picked up letter.

## 2018-10-06 NOTE — Telephone Encounter (Signed)
done

## 2018-10-15 ENCOUNTER — Ambulatory Visit: Payer: Medicare HMO | Admitting: Urology

## 2018-10-15 DIAGNOSIS — C672 Malignant neoplasm of lateral wall of bladder: Secondary | ICD-10-CM | POA: Diagnosis not present

## 2019-01-14 ENCOUNTER — Ambulatory Visit: Payer: Medicare HMO | Admitting: Urology

## 2019-01-14 DIAGNOSIS — C672 Malignant neoplasm of lateral wall of bladder: Secondary | ICD-10-CM

## 2019-02-03 ENCOUNTER — Other Ambulatory Visit: Payer: Self-pay

## 2019-02-03 MED ORDER — HYDROCHLOROTHIAZIDE 12.5 MG PO TABS: 12.5000 mg | ORAL_TABLET | Freq: Every day | ORAL | 1 refills | Status: DC

## 2019-02-03 MED ORDER — LISINOPRIL 10 MG PO TABS: 10.0000 mg | ORAL_TABLET | Freq: Every day | ORAL | 1 refills | Status: DC

## 2019-02-04 ENCOUNTER — Ambulatory Visit (INDEPENDENT_AMBULATORY_CARE_PROVIDER_SITE_OTHER): Payer: Medicare HMO | Admitting: Family Medicine

## 2019-02-04 ENCOUNTER — Other Ambulatory Visit: Payer: Self-pay

## 2019-02-04 DIAGNOSIS — I10 Essential (primary) hypertension: Secondary | ICD-10-CM

## 2019-02-04 DIAGNOSIS — R7303 Prediabetes: Secondary | ICD-10-CM | POA: Diagnosis not present

## 2019-02-04 DIAGNOSIS — E781 Pure hyperglyceridemia: Secondary | ICD-10-CM | POA: Diagnosis not present

## 2019-02-04 MED ORDER — HYDROCHLOROTHIAZIDE 12.5 MG PO TABS: 12.5000 mg | ORAL_TABLET | Freq: Every day | ORAL | 1 refills | Status: DC

## 2019-02-04 MED ORDER — LISINOPRIL 10 MG PO TABS: 10.0000 mg | ORAL_TABLET | Freq: Every day | ORAL | 1 refills | Status: DC

## 2019-02-04 NOTE — Progress Notes (Signed)
   Subjective:    Patient ID: Kayla [name], female    DOB: November 25, 1945, 73 y.o.   MRN: [id number] Audiogram Hypertension This is a chronic problem. The current episode started more than 1 year ago. Risk factors for coronary artery disease include post-menopausal state. Treatments tried: hctz, lisinopril. There are no compliance problems.     Virtual Visit via Video Note  I connected with Kayla [name] on 02/04/19 at 10:00 AM EDT by a video enabled telemedicine application and verified that I am speaking with the correct person using two identifiers.  Location: Patient: home Provider: office   I discussed the limitations of evaluation and management by telemedicine and the availability of in person appointments. The patient expressed understanding and agreed to proceed.  History of Present Illness:    Observations/Objective:   Assessment and Plan:   Follow Up Instructions:    I discussed the assessment and treatment plan with the patient. The patient was provided an opportunity to ask questions and all were answered. The patient agreed with the plan and demonstrated an understanding of the instructions.   The patient was advised to call back or seek an in-person evaluation if the symptoms worsen or if the condition fails to improve as anticipated.  I provided 17 minutes of non-face-to-face time during this encounter.   Blood pressure medicine and blood pressure levels reviewed today with patient. Compliant with blood pressure medicine. States does not miss a dose. No obvious side effects. Blood pressure generally good when checked elsewhere. Watching salt intake.    Review of Systems No headache no chest pain no abdominal pain    Objective:   Physical Exam  Virtual      Assessment & Plan:  Impression hypertension.  Currently controlled discussed maintain same meds.  2.  Benign brain tumor.  Followed by specialist for this  Diet exercise discussed follow-up  in 6 months

## 2019-02-07 ENCOUNTER — Encounter: Payer: Self-pay | Admitting: Family Medicine

## 2019-04-15 ENCOUNTER — Ambulatory Visit (INDEPENDENT_AMBULATORY_CARE_PROVIDER_SITE_OTHER): Payer: Medicare HMO | Admitting: Urology

## 2019-04-15 DIAGNOSIS — C672 Malignant neoplasm of lateral wall of bladder: Secondary | ICD-10-CM | POA: Diagnosis not present

## 2019-07-07 ENCOUNTER — Telehealth: Payer: Self-pay | Admitting: Family Medicine

## 2019-07-07 DIAGNOSIS — I10 Essential (primary) hypertension: Secondary | ICD-10-CM

## 2019-07-07 DIAGNOSIS — E781 Pure hyperglyceridemia: Secondary | ICD-10-CM

## 2019-07-07 DIAGNOSIS — R7303 Prediabetes: Secondary | ICD-10-CM

## 2019-07-07 NOTE — Telephone Encounter (Signed)
Pt has CPE scheduled 2/26 and would like to have lab work done before appt.

## 2019-07-07 NOTE — Telephone Encounter (Signed)
Please reorder regular labs from 08/04/18. Thanks.

## 2019-07-07 NOTE — Telephone Encounter (Signed)
Blood work ordered in Epic. Patient notified. 

## 2019-07-08 ENCOUNTER — Ambulatory Visit (INDEPENDENT_AMBULATORY_CARE_PROVIDER_SITE_OTHER): Payer: Medicare HMO | Admitting: Urology

## 2019-07-08 DIAGNOSIS — C672 Malignant neoplasm of lateral wall of bladder: Secondary | ICD-10-CM

## 2019-07-13 DIAGNOSIS — I10 Essential (primary) hypertension: Secondary | ICD-10-CM | POA: Diagnosis not present

## 2019-07-13 DIAGNOSIS — R7303 Prediabetes: Secondary | ICD-10-CM | POA: Diagnosis not present

## 2019-07-13 DIAGNOSIS — E781 Pure hyperglyceridemia: Secondary | ICD-10-CM | POA: Diagnosis not present

## 2019-07-14 LAB — BASIC METABOLIC PANEL
BUN/Creatinine Ratio: 16 (ref 12–28)
BUN: 16 mg/dL (ref 8–27)
CO2: 19 mmol/L — ABNORMAL LOW (ref 20–29)
Calcium: 9.5 mg/dL (ref 8.7–10.3)
Chloride: 102 mmol/L (ref 96–106)
Creatinine, Ser: 0.98 mg/dL (ref 0.57–1.00)
GFR calc Af Amer: 65 mL/min/{1.73_m2} (ref >59)
GFR calc non Af Amer: 57 mL/min/{1.73_m2} — ABNORMAL LOW (ref >59)
Glucose: 113 mg/dL — ABNORMAL HIGH (ref 65–99)
Potassium: 4.4 mmol/L (ref 3.5–5.2)
Sodium: 141 mmol/L (ref 134–144)

## 2019-07-14 LAB — LIPID PANEL
Chol/HDL Ratio: 4.5 ratio — ABNORMAL HIGH (ref 0.0–4.4)
Cholesterol, Total: 190 mg/dL (ref 100–199)
HDL: 42 mg/dL (ref >39)
LDL Chol Calc (NIH): 107 mg/dL — ABNORMAL HIGH (ref 0–99)
Triglycerides: 241 mg/dL — ABNORMAL HIGH (ref 0–149)
VLDL Cholesterol Cal: 41 mg/dL — ABNORMAL HIGH (ref 5–40)

## 2019-07-14 LAB — HEPATIC FUNCTION PANEL
ALT: 24 IU/L (ref 0–32)
AST: 25 IU/L (ref 0–40)
Albumin: 4.7 g/dL (ref 3.7–4.7)
Alkaline Phosphatase: 65 IU/L (ref 39–117)
Bilirubin Total: 0.7 mg/dL (ref 0.0–1.2)
Bilirubin, Direct: 0.15 mg/dL (ref 0.00–0.40)
Total Protein: 6.7 g/dL (ref 6.0–8.5)

## 2019-07-14 LAB — HEMOGLOBIN A1C
Est. average glucose Bld gHb Est-mCnc: 123 mg/dL
Hgb A1c MFr Bld: 5.9 % — ABNORMAL HIGH (ref 4.8–5.6)

## 2019-09-02 ENCOUNTER — Encounter: Payer: Self-pay | Admitting: Internal Medicine

## 2019-09-03 ENCOUNTER — Encounter: Payer: Self-pay | Admitting: Family Medicine

## 2019-09-05 ENCOUNTER — Ambulatory Visit: Payer: Medicare HMO

## 2019-09-25 ENCOUNTER — Other Ambulatory Visit: Payer: Self-pay

## 2019-09-25 ENCOUNTER — Ambulatory Visit (INDEPENDENT_AMBULATORY_CARE_PROVIDER_SITE_OTHER): Payer: Medicare HMO | Admitting: Nurse Practitioner

## 2019-09-25 ENCOUNTER — Encounter: Payer: Self-pay | Admitting: Nurse Practitioner

## 2019-09-25 VITALS — BP 128/82 | Ht 66.5 in | Wt 181.0 lb

## 2019-09-25 DIAGNOSIS — Z1211 Encounter for screening for malignant neoplasm of colon: Secondary | ICD-10-CM | POA: Diagnosis not present

## 2019-09-25 DIAGNOSIS — M8580 Other specified disorders of bone density and structure, unspecified site: Secondary | ICD-10-CM

## 2019-09-25 DIAGNOSIS — Z1283 Encounter for screening for malignant neoplasm of skin: Secondary | ICD-10-CM

## 2019-09-25 DIAGNOSIS — I499 Cardiac arrhythmia, unspecified: Secondary | ICD-10-CM | POA: Diagnosis not present

## 2019-09-25 DIAGNOSIS — Z01419 Encounter for gynecological examination (general) (routine) without abnormal findings: Secondary | ICD-10-CM | POA: Diagnosis not present

## 2019-09-25 DIAGNOSIS — L821 Other seborrheic keratosis: Secondary | ICD-10-CM

## 2019-09-25 MED ORDER — MELOXICAM 7.5 MG PO TABS: 7.5000 mg | ORAL_TABLET | Freq: Every day | ORAL | 0 refills | Status: DC | PRN

## 2019-09-25 NOTE — Progress Notes (Signed)
   Subjective:    Patient ID: Kayla [name], female    DOB: 1945/11/11, 74 y.o.   MRN: [id number]  HPI  The patient comes in today for a wellness visit.    A review of their health history was completed.  A review of medications was also completed.  Any needed refills; yes  Eating habits: trying to eat better  Falls/  MVA accidents in past few months: none  Regular exercise: walk to mailbox (1/2 a mile)  Specialist pt sees on regular basis: bladder dr-  Bladder cancer  Preventative health issues were discussed.   Additional concerns: none  Patient got 1st covid shot a few weeks ago  Review of Systems     Objective:   Physical Exam        Assessment & Plan:

## 2019-09-25 NOTE — Patient Instructions (Signed)
Pepcid (famotidine) 20 mg once daily   Food Choices for Gastroesophageal Reflux Disease, Adult When you have gastroesophageal reflux disease (GERD), the foods you eat and your eating habits are very important. Choosing the right foods can help ease your discomfort. Think about working with a nutrition specialist (dietitian) to help you make good choices. What are tips for following this plan?  Meals  Choose healthy foods that are low in fat, such as fruits, vegetables, whole grains, low-fat dairy products, and lean meat, fish, and poultry.  Eat small meals often instead of 3 large meals a day. Eat your meals slowly, and in a place where you are relaxed. Avoid bending over or lying down until 2-3 hours after eating.  Avoid eating meals 2-3 hours before bed.  Avoid drinking a lot of liquid with meals.  Cook foods using methods other than frying. Bake, grill, or broil food instead.  Avoid or limit: ? Chocolate. ? Peppermint or spearmint. ? Alcohol. ? Pepper. ? Black and decaffeinated coffee. ? Black and decaffeinated tea. ? Bubbly (carbonated) soft drinks. ? Caffeinated energy drinks and soft drinks.  Limit high-fat foods such as: ? Fatty meat or fried foods. ? Whole milk, cream, butter, or ice cream. ? Nuts and nut butters. ? Pastries, donuts, and sweets made with butter or shortening.  Avoid foods that cause symptoms. These foods may be different for everyone. Common foods that cause symptoms include: ? Tomatoes. ? Oranges, lemons, and limes. ? Peppers. ? Spicy food. ? Onions and garlic. ? Vinegar. Lifestyle  Maintain a healthy weight. Ask your doctor what weight is healthy for you. If you need to lose weight, work with your doctor to do so safely.  Exercise for at least 30 minutes for 5 or more days each week, or as told by your doctor.  Wear loose-fitting clothes.  Do not smoke. If you need help quitting, ask your doctor.  Sleep with the head of your bed higher  than your feet. Use a wedge under the mattress or blocks under the bed frame to raise the head of the bed. Summary  When you have gastroesophageal reflux disease (GERD), food and lifestyle choices are very important in easing your symptoms.  Eat small meals often instead of 3 large meals a day. Eat your meals slowly, and in a place where you are relaxed.  Limit high-fat foods such as fatty meat or fried foods.  Avoid bending over or lying down until 2-3 hours after eating.  Avoid peppermint and spearmint, caffeine, alcohol, and chocolate. This information is not intended to replace advice given to you by your health care provider. Make sure you discuss any questions you have with your health care provider. Document Revised: 11/06/2018 Document Reviewed: 08/21/2016 Elsevier Patient Education  [place].

## 2019-09-25 NOTE — Progress Notes (Signed)
Subjective:    Patient ID: Kayla [name], female    DOB: 06-07-1946, 74 y.o.   MRN: [id number]  HPI  Presents for well woman visit. Denies pain. Currently being followed for bladder cancer and no issues since surgery. Family history of colon, breast and bladder cancer. States had to have biopsy after last two mammograms and has deferred further mammograms at this time. Deferred shingles vaccine and both flu and pneumonia vaccines up to date. Routine vision and dental exams completed. Denies tobacco products and smoking. Consumes 2-3 glasses of wine per week. Diagnosis of hypertension. States she routinely monitors home blood pressure checks, exercises frequently with daily walks and is conscious of diet to decrease carbohydrate intake. Denies sexual activity and has pmh of hysterectomy/BSO. Complaints of difficulty swallowing at times and abdominal discomfort after consuming spicy foods. States caffeine intake consists of a cup of coffee in the am and denies OTC anti-inflammatory medications.    Review of Systems  Constitutional: Negative for activity change, appetite change, fatigue, fever and unexpected weight change.  HENT: Negative for congestion, rhinorrhea and sinus pressure.   Eyes: Negative for photophobia, redness and visual disturbance.  Respiratory: Negative for cough, chest tightness and shortness of breath.   Cardiovascular: Negative for chest pain, palpitations and leg swelling.       Denies syncope  Gastrointestinal: Negative for constipation, diarrhea, nausea and vomiting.  Endocrine: Negative for cold intolerance, heat intolerance, polydipsia, polyphagia and polyuria.  Genitourinary: Positive for frequency. Negative for difficulty urinating, dysuria, hematuria and urgency.  Musculoskeletal: Negative for arthralgias, back pain, gait problem and neck pain.  Neurological: Negative for weakness, numbness and headaches.       Objective:   Physical Exam Vitals reviewed.    Constitutional:      Appearance: Normal appearance. She is normal weight.     Comments: Upper thoracic kyphosis  Neck:     Thyroid: No thyroid mass, thyromegaly or thyroid tenderness.  Cardiovascular:     Rate and Rhythm: Normal rate.     Pulses: Normal pulses.     Comments: Heart rate slightly irregular. EKG ordered showing occasional PVC.  Pulmonary:     Effort: Pulmonary effort is normal.     Breath sounds: Normal breath sounds.  Chest:     Breasts:        Right: Normal. No inverted nipple, mass, skin change or tenderness.        Left: Normal. No inverted nipple, mass, skin change or tenderness.  Abdominal:     General: Abdomen is flat.     Palpations: Abdomen is soft. There is no mass.     Comments: Minimal epigastric area discomfort on exam.   Genitourinary:    General: Normal vulva.     Comments: External GU without rash, lesions, erythema Vagina- pale, dry without discharge Adnexa without tenderness or masses  Musculoskeletal:     Right lower leg: No edema.     Left lower leg: No edema.  Lymphadenopathy:     Upper Body:     Right upper body: No supraclavicular, axillary or pectoral adenopathy.     Left upper body: No supraclavicular, axillary or pectoral adenopathy.  Skin:    General: Skin is warm and dry.     Capillary Refill: Capillary refill takes less than 2 seconds.     Comments: Multiple seborrheic keratoses noted especially on the back.   Neurological:     Mental Status: She is alert and oriented to person, place, and  time.  Psychiatric:        Mood and Affect: Mood normal.        Behavior: Behavior normal.        Thought Content: Thought content normal.        Judgment: Judgment normal.          Assessment & Plan:   Problem List Items Addressed This Visit      Musculoskeletal and Integument   Osteopenia   Seborrheic keratosis   Relevant Orders   Ambulatory referral to Dermatology    Other Visit Diagnoses    Well woman exam    -  Primary    Irregular heartbeat       Relevant Orders   PR ELECTROCARDIOGRAM, COMPLETE   Screen for colon cancer       Relevant Orders   Ambulatory referral to Gastroenterology   Screening for skin cancer       Relevant Orders   Ambulatory referral to Dermatology     Meds ordered this encounter  Medications  . meloxicam (MOBIC) 7.5 MG tablet    Sig: Take 1 tablet (7.5 mg total) by mouth daily as needed for pain.    Dispense:  30 tablet    Refill:  0    Order Specific Question:   Supervising Provider    Answer:   Sallee Lange A [[id number]]      Bone density will be held and discussed during next visit Referral to Dr. Tarri Glenn, dermatology for skin cancer screening. OTC Pepcid 20 mg po qd prn acid reflux. Education provided on diet related to GERD  Education provided related to Mobic, discontinue if GI symptoms occur and advised to take with food Follow up in 6 months, call back sooner if needed.

## 2019-10-07 ENCOUNTER — Encounter: Payer: Self-pay | Admitting: Family Medicine

## 2019-10-07 ENCOUNTER — Telehealth: Payer: Self-pay | Admitting: *Deleted

## 2019-10-07 ENCOUNTER — Ambulatory Visit (INDEPENDENT_AMBULATORY_CARE_PROVIDER_SITE_OTHER): Payer: Medicare HMO | Admitting: Urology

## 2019-10-07 ENCOUNTER — Encounter: Payer: Self-pay | Admitting: Urology

## 2019-10-07 ENCOUNTER — Other Ambulatory Visit: Payer: Self-pay

## 2019-10-07 VITALS — BP 122/75 | HR 79 | Temp 97.8°F | Ht 67.0 in | Wt 193.0 lb

## 2019-10-07 DIAGNOSIS — C672 Malignant neoplasm of lateral wall of bladder: Secondary | ICD-10-CM

## 2019-10-07 DIAGNOSIS — M8580 Other specified disorders of bone density and structure, unspecified site: Secondary | ICD-10-CM

## 2019-10-07 DIAGNOSIS — Z780 Asymptomatic menopausal state: Secondary | ICD-10-CM

## 2019-10-07 LAB — POCT URINALYSIS DIPSTICK
Bilirubin, UA: NEGATIVE
Blood, UA: NEGATIVE
Glucose, UA: NEGATIVE
Ketones, UA: NEGATIVE
Leukocytes, UA: NEGATIVE
Nitrite, UA: NEGATIVE
Protein, UA: NEGATIVE
Spec Grav, UA: 1.02 (ref 1.010–1.025)
Urobilinogen, UA: 0.2 E.U./dL
pH, UA: 6.5 (ref 5.0–8.0)

## 2019-10-07 MED ORDER — CIPROFLOXACIN HCL 500 MG PO TABS
500.0000 mg | ORAL_TABLET | Freq: Once | ORAL | Status: AC
  Administered 2019-10-07: 11:00:00 500 mg via ORAL

## 2019-10-07 NOTE — Progress Notes (Signed)
   10/07/19  CC: bladder cancer  HPI: [name] is a 74yo here for followup for bladder cancer. No hematuria or dysuria  Her records from [facility] are as follows: 05/14/2018: She underwent TURBT which showed T1G3 with squamous differentiation. voiding trial passed today She has a right ureteral stent placement at the time of TURBT   06/25/2018: repeat TURBT showed inflammation.   10/15/2018: No hematuria or dysuria. She finished 6 weeks of BCG 1 month ago   01/14/2019: No hematuria or dysuria.   04/15/2019: NO hematuria or increasing LUTS. no dysuria.   07/08/2019: NO no news. No hematuria  Blood pressure 122/75, pulse 79, temperature 97.8 F (36.6 C), height 5\' 7"  (1.702 m), weight 193 lb (87.5 kg). NED. A&Ox3.   No respiratory distress   Abd soft, NT, ND Normal external genitalia with patent urethral meatus  Cystoscopy Procedure Note  Patient identification was confirmed, informed consent was obtained, and patient was prepped using Betadine solution.  Lidocaine jelly was administered per urethral meatus.    Procedure: - Flexible cystoscope introduced, without any difficulty.   - Thorough search of the bladder revealed:    normal urethral meatus    normal urothelium    no stones    no ulcers     no tumors    no urethral polyps    no trabeculation  - Ureteral orifices were normal in position and appearance.  Post-Procedure: - Patient tolerated the procedure well  Assessment/ Plan: -RTC 3 months for cystoscopy   No follow-ups on file.  Nicolette Bang, MD

## 2019-10-07 NOTE — Telephone Encounter (Signed)
Nilda Simmer, NP  Carmelina Noun, LPN  Please forward to message nurse.  After reviewing her bone density from 2019, recommend she go ahead and get bone density test done if she agrees. Thanks

## 2019-10-07 NOTE — Telephone Encounter (Signed)
Bone Density at [facility] Monday March 15 at 10am be there 9:45am - no calcium 48 hours prior to test

## 2019-10-07 NOTE — Telephone Encounter (Signed)
Patient notified and verbalized understanding. 

## 2019-10-07 NOTE — Patient Instructions (Signed)
Bladder Cancer  Bladder cancer is an abnormal growth of tissue in the bladder. The bladder is the balloon-like sac in the pelvis. It collects and stores urine that comes from the kidneys through the ureters. The bladder wall is made of layers. If cancer spreads into these layers and through the wall of the bladder, it becomes more difficult to treat. What are the causes? The cause of this condition is not known. What increases the risk? The following factors may make you more likely to develop this condition:  Smoking.  Workplace risks (occupational exposures), such as rubber, leather, textile, dyes, chemicals, and paint.  Being white.  Your age. Most people with bladder cancer are over the age of 55.  Being female.  Having chronic bladder inflammation.  Having a personal history of bladder cancer.  Having a family history of bladder cancer (heredity).  Having had chemotherapy or radiation therapy to the pelvis.  Having been exposed to arsenic. What are the signs or symptoms? Initial symptoms of this condition include:  Blood in the urine.  Painful urination.  Frequent bladder or urine infections.  Increase in urgency and frequency of urination. Advanced symptoms of this condition include:  Not being able to urinate.  Low back pain on one side.  Loss of appetite.  Weight loss.  Fatigue.  Swelling in the feet.  Bone pain. How is this diagnosed? This condition is diagnosed based on your medical history, a physical exam, urine tests, lab tests, imaging tests, and your symptoms. You may also have other tests or procedures done, such as:  A narrow tube being inserted into your bladder through your urethra (cystoscopy) in order to view the lining of your bladder for tumors.  A biopsy to sample the tumor to see if cancer is present. If cancer is present, it will then be staged to determine its severity and extent. Staging is an assessment of:  The size of the  tumor.  Whether the cancer has spread.  Where the cancer has spread. It is important to know how deeply into the bladder wall cancer has grown and whether cancer has spread to any other parts of your body. Staging may require blood tests or imaging tests, such as a CT scan, MRI, bone scan, or chest X-ray. How is this treated? Based on the stage of cancer, one treatment or a combination of treatments may be recommended. The most common forms of treatment are:  Surgery to remove the cancer. Procedures that may be done include transurethral resection and cystectomy.  Radiation therapy. This is high-energy X-rays or other particles. This is often used in combination with chemotherapy.  Chemotherapy. During this treatment, medicines are used to kill cancer cells.  Immunotherapy. This uses medicines to help your own immune system destroy cancer cells. Follow these instructions at home:  Take over-the-counter and prescription medicines only as told by your health care provider.  Maintain a healthy diet. Some of your treatments might affect your appetite.  Consider joining a support group. This may help you learn to cope with the stress of having bladder cancer.  Tell your cancer care team if you develop side effects. They may be able to recommend ways to relieve them.  Keep all follow-up visits as told by your health care provider. This is important. Where to find more information  American Cancer Society: www.cancer.org  National Cancer Institute (NCI): www.cancer.gov Contact a health care provider if:  You have symptoms of a urinary tract infection. These include: ?   Fever. ? Chills. ? Weakness. ? Muscle aches. ? Abdominal pain. ? Frequent and intense urge to urinate. ? Burning feeling in the bladder or urethra during urination. Get help right away if:  There is blood in your urine.  You cannot urinate.  You have severe pain or other symptoms that do not go  away. Summary  Bladder cancer is an abnormal growth of tissue in the bladder.  This condition is diagnosed based on your medical history, a physical exam, urine tests, lab tests, imaging tests, and your symptoms.  Based on the stage of cancer, surgery, chemotherapy, or a combination of treatments may be recommended.  Consider joining a support group. This may help you learn to cope with the stress of having bladder cancer. This information is not intended to replace advice given to you by your health care provider. Make sure you discuss any questions you have with your health care provider. Document Revised: 06/28/2017 Document Reviewed: 06/19/2016 Elsevier Patient Education  2020 Elsevier Inc.  

## 2019-10-07 NOTE — Progress Notes (Signed)
Urological Symptom Review  Patient is experiencing the following symptoms: Frequent urination Get up at night to urinate   Review of Systems  Gastrointestinal (upper)  : Indigestion/heartburn  Gastrointestinal (lower) : Negative for lower GI symptoms  Constitutional : Negative for symptoms  Skin: Negative for skin symptoms  Eyes: Negative for eye symptoms  Ear/Nose/Throat : Negative for Ear/Nose/Throat symptoms  Hematologic/Lymphatic: Negative for Hematologic/Lymphatic symptoms  Cardiovascular : Negative for cardiovascular symptoms  Respiratory : Shortness of breath  Endocrine: Negative for endocrine symptoms  Musculoskeletal: Negative for musculoskeletal symptoms  Neurological: Negative for neurological symptoms  Psychologic: Negative for psychiatric symptoms

## 2019-10-08 ENCOUNTER — Encounter: Payer: Self-pay | Admitting: Family Medicine

## 2019-10-12 ENCOUNTER — Other Ambulatory Visit: Payer: Self-pay

## 2019-10-12 ENCOUNTER — Ambulatory Visit (HOSPITAL_COMMUNITY)
Admission: RE | Admit: 2019-10-12 | Discharge: 2019-10-12 | Disposition: A | Discharge status: Home / Self Care | Payer: Medicare HMO | Source: Ambulatory Visit | Attending: Family Medicine | Admitting: Family Medicine

## 2019-10-12 ENCOUNTER — Encounter: Payer: Self-pay | Admitting: Internal Medicine

## 2019-10-12 DIAGNOSIS — M85851 Other specified disorders of bone density and structure, right thigh: Secondary | ICD-10-CM | POA: Diagnosis not present

## 2019-10-12 DIAGNOSIS — M8580 Other specified disorders of bone density and structure, unspecified site: Secondary | ICD-10-CM | POA: Insufficient documentation

## 2019-10-12 DIAGNOSIS — Z13820 Encounter for screening for osteoporosis: Secondary | ICD-10-CM | POA: Diagnosis not present

## 2019-10-12 DIAGNOSIS — Z780 Asymptomatic menopausal state: Secondary | ICD-10-CM | POA: Diagnosis not present

## 2019-10-22 ENCOUNTER — Other Ambulatory Visit: Payer: Self-pay | Admitting: Nurse Practitioner

## 2019-10-28 ENCOUNTER — Other Ambulatory Visit: Payer: Self-pay | Admitting: Family Medicine

## 2019-11-03 DIAGNOSIS — L570 Actinic keratosis: Secondary | ICD-10-CM | POA: Diagnosis not present

## 2019-11-03 DIAGNOSIS — D1801 Hemangioma of skin and subcutaneous tissue: Secondary | ICD-10-CM | POA: Diagnosis not present

## 2019-11-03 DIAGNOSIS — L821 Other seborrheic keratosis: Secondary | ICD-10-CM | POA: Diagnosis not present

## 2019-11-19 ENCOUNTER — Other Ambulatory Visit: Payer: Self-pay | Admitting: Nurse Practitioner

## 2019-11-30 ENCOUNTER — Telehealth: Payer: Self-pay | Admitting: *Deleted

## 2019-11-30 ENCOUNTER — Encounter: Payer: Self-pay | Admitting: Family Medicine

## 2019-11-30 ENCOUNTER — Other Ambulatory Visit: Payer: Self-pay

## 2019-11-30 ENCOUNTER — Encounter: Payer: Self-pay | Admitting: Gastroenterology

## 2019-11-30 ENCOUNTER — Ambulatory Visit (INDEPENDENT_AMBULATORY_CARE_PROVIDER_SITE_OTHER): Payer: Medicare HMO | Admitting: Family Medicine

## 2019-11-30 ENCOUNTER — Encounter: Payer: Self-pay | Admitting: *Deleted

## 2019-11-30 ENCOUNTER — Ambulatory Visit: Payer: Medicare HMO | Admitting: Gastroenterology

## 2019-11-30 VITALS — BP 117/81 | HR 80 | Temp 96.9°F | Ht 66.5 in | Wt 195.4 lb

## 2019-11-30 VITALS — BP 122/78 | HR 85 | Temp 97.2°F | Ht 66.5 in | Wt 196.6 lb

## 2019-11-30 DIAGNOSIS — Z800 Family history of malignant neoplasm of digestive organs: Secondary | ICD-10-CM | POA: Insufficient documentation

## 2019-11-30 DIAGNOSIS — L259 Unspecified contact dermatitis, unspecified cause: Secondary | ICD-10-CM

## 2019-11-30 DIAGNOSIS — L304 Erythema intertrigo: Secondary | ICD-10-CM | POA: Diagnosis not present

## 2019-11-30 DIAGNOSIS — Z86010 Personal history of colonic polyps: Secondary | ICD-10-CM | POA: Diagnosis not present

## 2019-11-30 MED ORDER — FLUCONAZOLE 150 MG PO TABS: 150.0000 mg | ORAL_TABLET | Freq: Once | ORAL | 0 refills | Status: AC

## 2019-11-30 MED ORDER — CLOTRIMAZOLE-BETAMETHASONE 1-0.05 % EX CREA: 1.0000 "application " | TOPICAL_CREAM | Freq: Two times a day (BID) | CUTANEOUS | 0 refills | Status: DC

## 2019-11-30 MED ORDER — PREDNISONE 10 MG PO TABS: ORAL_TABLET | ORAL | 0 refills | Status: DC

## 2019-11-30 NOTE — Progress Notes (Signed)
Patient ID: Kayla [name], female    DOB: 09-Jan-1946, 74 y.o.   MRN: [id number]   Chief Complaint  Patient presents with  . Rash    Pt having rash on body with new areas popping up. Pt states she mowed the yard 2 weeks ago. She wears a long sleeve shirt when mowing and removes it as soon as she is done.Pt found a tick on her washing machine yesterday and is not sure if that has anything to do with rash. Rash started on left index finger and then went to middle finger, then to neck; now rash on lower abdominal area. Positive for itch. Pt has been taking allergy pills q 4 hrs to help with itching    Subjective:    HPI Pt stating started 2 wks ago with rash on finger, then spread to her rt neck and then on left forearm. Also noticing recently having rash on lower abd, across the lower abd.  Redness, no blisters.  Tried otc creams w/o relief. calomine lotion on finger/arm and alcohol on neck. Was mowing yard 2 wks ago in long sleeve shirt, and saw a tick on the washing machine after taking shirt off.  Never had tick bite and no bull's eye rash seen. Taking allergy pills q4hrs and not helping.   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   Outpatient Encounter Medications as of 11/30/2019  Medication Sig  . albuterol (PROVENTIL HFA;VENTOLIN HFA) 108 (90 BASE) MCG/ACT inhaler Inhale 2 puffs into the lungs every 4 (four) hours as needed for wheezing.  . Cholecalciferol (VITAMIN D) 2000 units tablet Take 2,000 Units by mouth 2 (two) times a week.  . diphenhydrAMINE (BENADRYL) 50 MG tablet Take 50 mg by mouth at bedtime as needed for sleep.  . hydrochlorothiazide (HYDRODIURIL) 12.5 MG tablet TAKE 1 TABLET BY MOUTH EVERY DAY  . lisinopril (ZESTRIL) 10 MG tablet  TAKE 1 TABLET BY MOUTH EVERY DAY  . loratadine (CLARITIN) 10 MG tablet Take 10 mg by mouth daily as needed for allergies.  . meloxicam (MOBIC) 7.5 MG tablet TAKE 1 TABLET BY MOUTH EVERY DAY AS NEEDED FOR PAIN  . clotrimazole-betamethasone (LOTRISONE) cream Apply 1 application topically 2 (two) times daily. Apply to abdomen rash for 2 wks.  . fluconazole (DIFLUCAN) 150 MG tablet Take 1 tablet (150 mg total) by mouth once for 1 dose.  . predniSONE (DELTASONE) 10 MG tablet 4 tabs po. X3 days, then 3 tab x3 days, then 2 tab x3 days, then 1 tab x3days.  Take with food.   No facility-administered encounter medications on file as of 11/30/2019.     Review of Systems  Constitutional: Negative for chills and fever.  Gastrointestinal: Negative for abdominal pain, constipation, diarrhea and vomiting.  Musculoskeletal: Negative for back pain, joint swelling and myalgias.  Skin: Positive for rash. Negative for wound.     Vitals BP 122/78   Pulse 85   Temp (!) 97.2 F (36.2 C)   Ht 5' 6.5" (1.689 m)   Wt 196 lb 9.6 oz (89.2 kg)   SpO2 95%   BMI 31.26 kg/m   Objective:   Physical Exam Constitutional:      General: She is not in acute distress.    Appearance: Normal appearance. She is not ill-appearing.  Abdominal:     General: Bowel  sounds are normal.     Palpations: Abdomen is soft.     Tenderness: There is no abdominal tenderness.     Comments: +rash  Skin:    General: Skin is warm and dry.     Findings: Rash (erythema and linear rash on left forearm adn on right index finger.) present.     Comments: +rt neck/shoulder with erythema, no blisters. +under abdominal panus-erythematous moist rash with satellite lesions.   Neurological:     General: No focal deficit present.     Mental Status: She is alert and oriented to person, place, and time.      Assessment and Plan   1. Intertrigo - fluconazole (DIFLUCAN) 150 MG tablet; Take 1 tablet (150 mg total) by mouth once for 1 dose.   Dispense: 1 tablet; Refill: 0 - clotrimazole-betamethasone (LOTRISONE) cream; Apply 1 application topically 2 (two) times daily. Apply to abdomen rash for 2 wks.  Dispense: 60 g; Refill: 0  2. Contact dermatitis, unspecified contact dermatitis type, unspecified trigger - predniSONE (DELTASONE) 10 MG tablet; 4 tabs po. X3 days, then 3 tab x3 days, then 2 tab x3 days, then 1 tab x3days.  Take with food.  Dispense: 30 tablet; Refill: 0    Contact dermatitis- concerning for poison ivy on the finger and arm/neck.- gave prednsione taper and pt to use calomine lotion and HC prn.  The abdomen appears to be candidal intertrigo.- gave diflucan and clotrimazole cream.   F/u prn or worsening symptoms.   [place], DO 11/30/2019

## 2019-11-30 NOTE — Patient Instructions (Signed)
Colonoscopy as scheduled.  Please see separate instructions.

## 2019-11-30 NOTE — Telephone Encounter (Signed)
A approved via [employer] for [employer]. Auth# [id number] dates 12/25/2019-01/24/2020

## 2019-11-30 NOTE — Assessment & Plan Note (Signed)
Pleasant 74 year old lady with personal history of bladder cancer, history of adenomatous colon polyps, family history of colon cancer in 2 first-degree relatives presenting for consideration of colonoscopy.  Last colonoscopy in 2016.  Overall patient is in reasonably good health.  She would like to pursue at least 1 more colonoscopy which I feel is reasonable.  I have discussed the risks, alternatives, benefits with regards to but not limited to the risk of reaction to medication, bleeding, infection, perforation and the patient is agreeable to proceed. Written consent to be obtained.  Encouraged her to follow-up with [name] for rash today as planned.

## 2019-11-30 NOTE — H&P (View-Only) (Signed)
Primary Care Physician:  Mikey Kirschner, MD  Primary Gastroenterologist:  Garfield Cornea, MD   Chief Complaint  Patient presents with  . Colonoscopy    consult, doing ok    HPI:  Kayla [name] is a 74 y.o. female here for consideration of surveillance colonoscopy.  Patient has a personal history of tubular adenomas of the colon, family history of colon cancer in 2 first-degree relatives.  Her last colonoscopy was in February 2016 at which time she had multiple tubular adenomas removed.  She was advised for 5-year follow-up colonoscopy.  She has had bladder cancer since we last saw her.  Required resection and BCG treatment. No melena, brbpr. No abdominal pain. Appetite has been good. Takes Prevacid as needed for heartburn with good results. No dysphagia to food.  Some vague dysphagia to large pills. No weight loss.   Plans to see [name] today for rash that has been occurring for 2 weeks.  She wonders if it is related to a tick bite.  She found a tick on her clothing approximately 2 weeks ago.    Current Outpatient Medications  Medication Sig Dispense Refill  . albuterol (PROVENTIL HFA;VENTOLIN HFA) 108 (90 BASE) MCG/ACT inhaler Inhale 2 puffs into the lungs every 4 (four) hours as needed for wheezing. 1 Inhaler 5  . Cholecalciferol (VITAMIN D) 2000 units tablet Take 2,000 Units by mouth 2 (two) times a week.    . diphenhydrAMINE (BENADRYL) 50 MG tablet Take 50 mg by mouth at bedtime as needed for sleep.    . hydrochlorothiazide (HYDRODIURIL) 12.5 MG tablet TAKE 1 TABLET BY MOUTH EVERY DAY 90 tablet 1  . lisinopril (ZESTRIL) 10 MG tablet TAKE 1 TABLET BY MOUTH EVERY DAY 90 tablet 1  . loratadine (CLARITIN) 10 MG tablet Take 10 mg by mouth daily as needed for allergies.    . meloxicam (MOBIC) 7.5 MG tablet TAKE 1 TABLET BY MOUTH EVERY DAY AS NEEDED FOR PAIN 30 tablet 5   No current facility-administered medications for this visit.    Allergies as of 11/30/2019 - Review Complete  11/30/2019  Allergen Reaction Noted  . Verapamil Swelling 07/19/2013    Past Medical History:  Diagnosis Date  . Arthritis   . Asthma    excercise induced  . Benign brain tumor ([place]) 2002   Treated at [facility] with gamma knife  . Chest discomfort   . GERD (gastroesophageal reflux disease)   . Hypertension   . Impaired fasting glucose   . Irritable bowel syndrome   . Osteopenia   . Tinnitus   . Venous insufficiency     Past Surgical History:  Procedure Laterality Date  . ABDOMINAL HYSTERECTOMY    . APPENDECTOMY    . BREAST BIOPSY Right   . CARPAL TUNNEL RELEASE     Bilateral  . COLONOSCOPY N/A 09/15/2014   Dr. Gala Romney: Multiple colonic polyps removed, tubular adenomas.  Next colonoscopy in 5 years  . CYSTOSCOPY W/ URETERAL STENT PLACEMENT Bilateral 05/05/2018   Procedure: CYSTOSCOPY WITH BILATERAL RETROGRADE PYELOGRAM/ RIGHT URETERAL STENT PLACEMENT;  Surgeon: Cleon Gustin, MD;  Location: [facility];  Service: Urology;  Laterality: Bilateral;  . LUMBAR DISC SURGERY    . PARTIAL HYSTERECTOMY    . TRANSURETHRAL RESECTION OF BLADDER TUMOR N/A 05/05/2018   Procedure: TRANSURETHRAL RESECTION OF BLADDER TUMOR (TURBT);  Surgeon: Cleon Gustin, MD;  Location: [facility];  Service: Urology;  Laterality: N/A;  . TRANSURETHRAL RESECTION OF BLADDER TUMOR N/A 06/16/2018   Procedure:  TRANSURETHRAL RESECTION OF BLADDER TUMOR (TURBT);  Surgeon: Cleon Gustin, MD;  Location: [facility];  Service: Urology;  Laterality: N/A;    Family History  Problem Relation Age of Onset  . Cancer Father        colon cancer; also brother-colon And sister-breast  . Heart disease Father   . Heart disease Mother        at advanced age  . Hypertension Mother   . Hyperlipidemia Mother   . Osteoporosis Mother   . Cancer Brother        liver and colon  . Cancer Sister 59       breast    Social History   Socioeconomic History  . Marital status: Married    Spouse name: Not on file  . Number of  children: 2  . Years of education: Not on file  . Highest education level: Not on file  Occupational History  . 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 Smoking status: Never Smoker  . Smokeless tobacco: Never Used  Substance and Sexual Activity  . Alcohol use: Yes    Alcohol/week: 2.0 standard drinks    Types: 2 Glasses of wine per week    Comment: occ wine  . Drug use: No  . Sexual activity: Not Currently    Birth control/protection: Post-menopausal, Surgical  Other Topics Concern  . Not on file  Social History Narrative   Exercises regularly   Social Determinants of Health   Financial Resource Strain:   . Difficulty of Paying Living Expenses:   Food Insecurity:   . Worried About Charity fundraiser in the Last Year:   . Arboriculturist in the Last Year:   Transportation Needs:   . Film/video editor (Medical):   Marland Kitchen Lack of Transportation (Non-Medical):   Physical Activity:   . Days of Exercise per Week:   . Minutes of Exercise per Session:   Stress:   . Feeling of Stress :   Social Connections:   . Frequency of Communication with Friends and Family:   . Frequency of Social Gatherings with Friends and Family:   . Attends Religious Services:   . Active Member of Clubs or Organizations:   . Attends Archivist Meetings:   Marland Kitchen Marital Status:   Intimate Partner Violence:   . Fear of Current or Ex-Partner:   . Emotionally Abused:   Marland Kitchen Physically Abused:   . Sexually Abused:       ROS:  General: Negative for anorexia, weight loss, fever, chills, fatigue, weakness. Eyes: Negative for vision changes.  ENT: Negative for hoarseness, difficulty swallowing , nasal congestion. CV: Negative for chest pain, angina, palpitations, dyspnea on exertion, peripheral edema.  Respiratory: Negative for dyspnea at rest, dyspnea on exertion, cough, sputum, wheezing.  GI: See history of present illness. GU:  Negative for dysuria,  hematuria, urinary incontinence, urinary frequency, nocturnal urination.  MS: Negative for joint pain, low back pain.  Derm: Rash and itching for 2 weeks.  Neuro: Negative for weakness, abnormal sensation, seizure, frequent headaches, memory loss, confusion.  Psych: Negative for anxiety, depression, suicidal ideation, hallucinations.  Endo: Negative for unusual weight change.  Heme: Negative for bruising or bleeding. Allergy: Negative for rash or hives.    Physical Examination:  BP 117/81   Pulse 80   Temp (!) 96.9 F (36.1 C) (Temporal)   Ht 5' 6.5" (1.689 m)   Wt 195  lb 6.4 oz (88.6 kg)   BMI 31.07 kg/m    General: Well-nourished, well-developed in no acute distress.  Head: Normocephalic, atraumatic.   Eyes: Conjunctiva pink, no icterus. Mouth: masked Neck: Supple without thyromegaly, masses, or lymphadenopathy.  Lungs: Clear to auscultation bilaterally.  Heart: Regular rate and rhythm, no murmurs rubs or gallops.  Abdomen: Bowel sounds are normal, nontender, nondistended, no hepatosplenomegaly or masses, no abdominal bruits or    hernia , no rebound or guarding.   Rectal: not Performed Extremities: No lower extremity edema. No clubbing or deformities.  Neuro: Alert and oriented x 4 , grossly normal neurologically.  Skin: Warm and dry, no   jaundice.  Rash noted over the right midabdomen, under the panniculus, forearms, neck. Psych: Alert and cooperative, normal mood and affect.  Labs: Lab Results  Component Value Date   WBC 7.2 04/30/2018   HGB 14.5 04/30/2018   HCT 43.5 04/30/2018   MCV 92.4 04/30/2018   PLT 192 04/30/2018   Lab Results  Component Value Date   CREATININE 0.98 07/13/2019   BUN 16 07/13/2019   NA 141 07/13/2019   K 4.4 07/13/2019   CL 102 07/13/2019   CO2 19 (L) 07/13/2019   Lab Results  Component Value Date   ALT 24 07/13/2019   AST 25 07/13/2019   ALKPHOS 65 07/13/2019   BILITOT 0.7 07/13/2019   No results found for: IRON, TIBC,  FERRITIN   Imaging Studies: No results found.

## 2019-11-30 NOTE — Progress Notes (Signed)
CC'ED TO PCP 

## 2019-11-30 NOTE — Progress Notes (Signed)
Primary Care Physician:  Mikey Kirschner, MD  Primary Gastroenterologist:  Garfield Cornea, MD   Chief Complaint  Patient presents with  . Colonoscopy    consult, doing ok    HPI:  Kayla [name] is a 74 y.o. female here for consideration of surveillance colonoscopy.  Patient has a personal history of tubular adenomas of the colon, family history of colon cancer in 2 first-degree relatives.  Her last colonoscopy was in February 2016 at which time she had multiple tubular adenomas removed.  She was advised for 5-year follow-up colonoscopy.  She has had bladder cancer since we last saw her.  Required resection and BCG treatment. No melena, brbpr. No abdominal pain. Appetite has been good. Takes Prevacid as needed for heartburn with good results. No dysphagia to food.  Some vague dysphagia to large pills. No weight loss.   Plans to see [name] today for rash that has been occurring for 2 weeks.  She wonders if it is related to a tick bite.  She found a tick on her clothing approximately 2 weeks ago.    Current Outpatient Medications  Medication Sig Dispense Refill  . albuterol (PROVENTIL HFA;VENTOLIN HFA) 108 (90 BASE) MCG/ACT inhaler Inhale 2 puffs into the lungs every 4 (four) hours as needed for wheezing. 1 Inhaler 5  . Cholecalciferol (VITAMIN D) 2000 units tablet Take 2,000 Units by mouth 2 (two) times a week.    . diphenhydrAMINE (BENADRYL) 50 MG tablet Take 50 mg by mouth at bedtime as needed for sleep.    . hydrochlorothiazide (HYDRODIURIL) 12.5 MG tablet TAKE 1 TABLET BY MOUTH EVERY DAY 90 tablet 1  . lisinopril (ZESTRIL) 10 MG tablet TAKE 1 TABLET BY MOUTH EVERY DAY 90 tablet 1  . loratadine (CLARITIN) 10 MG tablet Take 10 mg by mouth daily as needed for allergies.    . meloxicam (MOBIC) 7.5 MG tablet TAKE 1 TABLET BY MOUTH EVERY DAY AS NEEDED FOR PAIN 30 tablet 5   No current facility-administered medications for this visit.    Allergies as of 11/30/2019 - Review Complete  11/30/2019  Allergen Reaction Noted  . Verapamil Swelling 07/19/2013    Past Medical History:  Diagnosis Date  . Arthritis   . Asthma    excercise induced  . Benign brain tumor ([place]) 2002   Treated at [facility] with gamma knife  . Chest discomfort   . GERD (gastroesophageal reflux disease)   . Hypertension   . Impaired fasting glucose   . Irritable bowel syndrome   . Osteopenia   . Tinnitus   . Venous insufficiency     Past Surgical History:  Procedure Laterality Date  . ABDOMINAL HYSTERECTOMY    . APPENDECTOMY    . BREAST BIOPSY Right   . CARPAL TUNNEL RELEASE     Bilateral  . COLONOSCOPY N/A 09/15/2014   Dr. Gala Romney: Multiple colonic polyps removed, tubular adenomas.  Next colonoscopy in 5 years  . CYSTOSCOPY W/ URETERAL STENT PLACEMENT Bilateral 05/05/2018   Procedure: CYSTOSCOPY WITH BILATERAL RETROGRADE PYELOGRAM/ RIGHT URETERAL STENT PLACEMENT;  Surgeon: Cleon Gustin, MD;  Location: [facility];  Service: Urology;  Laterality: Bilateral;  . LUMBAR DISC SURGERY    . PARTIAL HYSTERECTOMY    . TRANSURETHRAL RESECTION OF BLADDER TUMOR N/A 05/05/2018   Procedure: TRANSURETHRAL RESECTION OF BLADDER TUMOR (TURBT);  Surgeon: Cleon Gustin, MD;  Location: [facility];  Service: Urology;  Laterality: N/A;  . TRANSURETHRAL RESECTION OF BLADDER TUMOR N/A 06/16/2018   Procedure:  TRANSURETHRAL RESECTION OF BLADDER TUMOR (TURBT);  Surgeon: Cleon Gustin, MD;  Location: [facility];  Service: Urology;  Laterality: N/A;    Family History  Problem Relation Age of Onset  . Cancer Father        colon cancer; also brother-colon And sister-breast  . Heart disease Father   . Heart disease Mother        at advanced age  . Hypertension Mother   . Hyperlipidemia Mother   . Osteoporosis Mother   . Cancer Brother        liver and colon  . Cancer Sister 70       breast    Social History   Socioeconomic History  . Marital status: Married    Spouse name: Not on file  . Number of  children: 2  . Years of education: Not on file  . Highest education level: Not on file  Occupational History  . 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 Smoking status: Never Smoker  . Smokeless tobacco: Never Used  Substance and Sexual Activity  . Alcohol use: Yes    Alcohol/week: 2.0 standard drinks    Types: 2 Glasses of wine per week    Comment: occ wine  . Drug use: No  . Sexual activity: Not Currently    Birth control/protection: Post-menopausal, Surgical  Other Topics Concern  . Not on file  Social History Narrative   Exercises regularly   Social Determinants of Health   Financial Resource Strain:   . Difficulty of Paying Living Expenses:   Food Insecurity:   . Worried About Charity fundraiser in the Last Year:   . Arboriculturist in the Last Year:   Transportation Needs:   . Film/video editor (Medical):   Marland Kitchen Lack of Transportation (Non-Medical):   Physical Activity:   . Days of Exercise per Week:   . Minutes of Exercise per Session:   Stress:   . Feeling of Stress :   Social Connections:   . Frequency of Communication with Friends and Family:   . Frequency of Social Gatherings with Friends and Family:   . Attends Religious Services:   . Active Member of Clubs or Organizations:   . Attends Archivist Meetings:   Marland Kitchen Marital Status:   Intimate Partner Violence:   . Fear of Current or Ex-Partner:   . Emotionally Abused:   Marland Kitchen Physically Abused:   . Sexually Abused:       ROS:  General: Negative for anorexia, weight loss, fever, chills, fatigue, weakness. Eyes: Negative for vision changes.  ENT: Negative for hoarseness, difficulty swallowing , nasal congestion. CV: Negative for chest pain, angina, palpitations, dyspnea on exertion, peripheral edema.  Respiratory: Negative for dyspnea at rest, dyspnea on exertion, cough, sputum, wheezing.  GI: See history of present illness. GU:  Negative for dysuria,  hematuria, urinary incontinence, urinary frequency, nocturnal urination.  MS: Negative for joint pain, low back pain.  Derm: Rash and itching for 2 weeks.  Neuro: Negative for weakness, abnormal sensation, seizure, frequent headaches, memory loss, confusion.  Psych: Negative for anxiety, depression, suicidal ideation, hallucinations.  Endo: Negative for unusual weight change.  Heme: Negative for bruising or bleeding. Allergy: Negative for rash or hives.    Physical Examination:  BP 117/81   Pulse 80   Temp (!) 96.9 F (36.1 C) (Temporal)   Ht 5' 6.5" (1.689 m)   Wt 195  lb 6.4 oz (88.6 kg)   BMI 31.07 kg/m    General: Well-nourished, well-developed in no acute distress.  Head: Normocephalic, atraumatic.   Eyes: Conjunctiva pink, no icterus. Mouth: masked Neck: Supple without thyromegaly, masses, or lymphadenopathy.  Lungs: Clear to auscultation bilaterally.  Heart: Regular rate and rhythm, no murmurs rubs or gallops.  Abdomen: Bowel sounds are normal, nontender, nondistended, no hepatosplenomegaly or masses, no abdominal bruits or    hernia , no rebound or guarding.   Rectal: not Performed Extremities: No lower extremity edema. No clubbing or deformities.  Neuro: Alert and oriented x 4 , grossly normal neurologically.  Skin: Warm and dry, no   jaundice.  Rash noted over the right midabdomen, under the panniculus, forearms, neck. Psych: Alert and cooperative, normal mood and affect.  Labs: Lab Results  Component Value Date   WBC 7.2 04/30/2018   HGB 14.5 04/30/2018   HCT 43.5 04/30/2018   MCV 92.4 04/30/2018   PLT 192 04/30/2018   Lab Results  Component Value Date   CREATININE 0.98 07/13/2019   BUN 16 07/13/2019   NA 141 07/13/2019   K 4.4 07/13/2019   CL 102 07/13/2019   CO2 19 (L) 07/13/2019   Lab Results  Component Value Date   ALT 24 07/13/2019   AST 25 07/13/2019   ALKPHOS 65 07/13/2019   BILITOT 0.7 07/13/2019   No results found for: IRON, TIBC,  FERRITIN   Imaging Studies: No results found.

## 2019-12-23 ENCOUNTER — Other Ambulatory Visit: Payer: Self-pay

## 2019-12-23 ENCOUNTER — Other Ambulatory Visit (HOSPITAL_COMMUNITY)
Admission: RE | Admit: 2019-12-23 | Discharge: 2019-12-23 | Disposition: A | Discharge status: Home / Self Care | Payer: Medicare HMO | Source: Ambulatory Visit | Attending: Internal Medicine | Admitting: Internal Medicine

## 2019-12-23 DIAGNOSIS — Z01812 Encounter for preprocedural laboratory examination: Secondary | ICD-10-CM | POA: Diagnosis not present

## 2019-12-23 DIAGNOSIS — Z20822 Contact with and (suspected) exposure to covid-19: Secondary | ICD-10-CM | POA: Diagnosis not present

## 2019-12-23 LAB — SARS CORONAVIRUS 2 (TAT 6-24 HRS): SARS Coronavirus 2: NEGATIVE

## 2019-12-25 ENCOUNTER — Encounter: Payer: Self-pay | Admitting: Internal Medicine

## 2019-12-25 ENCOUNTER — Ambulatory Visit (HOSPITAL_COMMUNITY)
Admission: RE | Admit: 2019-12-25 | Discharge: 2019-12-25 | Disposition: A | Discharge status: Home / Self Care | Payer: Medicare HMO | Attending: Internal Medicine | Admitting: Internal Medicine

## 2019-12-25 ENCOUNTER — Encounter (HOSPITAL_COMMUNITY)
Admission: RE | Disposition: A | Discharge status: Home / Self Care | Payer: Self-pay | Source: Home / Self Care | Attending: Internal Medicine

## 2019-12-25 ENCOUNTER — Other Ambulatory Visit: Payer: Self-pay

## 2019-12-25 ENCOUNTER — Encounter (HOSPITAL_COMMUNITY): Payer: Self-pay | Admitting: Internal Medicine

## 2019-12-25 DIAGNOSIS — Z1211 Encounter for screening for malignant neoplasm of colon: Secondary | ICD-10-CM | POA: Diagnosis not present

## 2019-12-25 DIAGNOSIS — K635 Polyp of colon: Secondary | ICD-10-CM | POA: Diagnosis not present

## 2019-12-25 DIAGNOSIS — Z8551 Personal history of malignant neoplasm of bladder: Secondary | ICD-10-CM | POA: Diagnosis not present

## 2019-12-25 DIAGNOSIS — M8580 Other specified disorders of bone density and structure, unspecified site: Secondary | ICD-10-CM | POA: Insufficient documentation

## 2019-12-25 DIAGNOSIS — Z791 Long term (current) use of non-steroidal anti-inflammatories (NSAID): Secondary | ICD-10-CM | POA: Diagnosis not present

## 2019-12-25 DIAGNOSIS — I872 Venous insufficiency (chronic) (peripheral): Secondary | ICD-10-CM | POA: Diagnosis not present

## 2019-12-25 DIAGNOSIS — Z79899 Other long term (current) drug therapy: Secondary | ICD-10-CM | POA: Diagnosis not present

## 2019-12-25 DIAGNOSIS — D124 Benign neoplasm of descending colon: Secondary | ICD-10-CM | POA: Diagnosis not present

## 2019-12-25 DIAGNOSIS — M1990 Unspecified osteoarthritis, unspecified site: Secondary | ICD-10-CM | POA: Insufficient documentation

## 2019-12-25 DIAGNOSIS — D126 Benign neoplasm of colon, unspecified: Secondary | ICD-10-CM

## 2019-12-25 DIAGNOSIS — I10 Essential (primary) hypertension: Secondary | ICD-10-CM | POA: Diagnosis not present

## 2019-12-25 DIAGNOSIS — Z800 Family history of malignant neoplasm of digestive organs: Secondary | ICD-10-CM | POA: Insufficient documentation

## 2019-12-25 DIAGNOSIS — Z86010 Personal history of colonic polyps: Secondary | ICD-10-CM | POA: Diagnosis not present

## 2019-12-25 DIAGNOSIS — K219 Gastro-esophageal reflux disease without esophagitis: Secondary | ICD-10-CM | POA: Insufficient documentation

## 2019-12-25 DIAGNOSIS — J45990 Exercise induced bronchospasm: Secondary | ICD-10-CM | POA: Insufficient documentation

## 2019-12-25 DIAGNOSIS — D120 Benign neoplasm of cecum: Secondary | ICD-10-CM | POA: Diagnosis not present

## 2019-12-25 HISTORY — PX: COLONOSCOPY: SHX5424

## 2019-12-25 HISTORY — PX: POLYPECTOMY: SHX5525

## 2019-12-25 SURGERY — COLONOSCOPY
Anesthesia: Moderate Sedation

## 2019-12-25 MED ORDER — MIDAZOLAM HCL 5 MG/5ML IJ SOLN
INTRAMUSCULAR | Status: DC | PRN
  Administered 2019-12-25: 1 mg via INTRAVENOUS
  Administered 2019-12-25: 2 mg via INTRAVENOUS
  Administered 2019-12-25: 1 mg via INTRAVENOUS
  Administered 2019-12-25: 2 mg via INTRAVENOUS

## 2019-12-25 MED ORDER — MEPERIDINE HCL 100 MG/ML IJ SOLN
INTRAMUSCULAR | Status: DC | PRN
  Administered 2019-12-25: 25 mg via INTRAVENOUS
  Administered 2019-12-25: 15 mg via INTRAVENOUS

## 2019-12-25 MED ORDER — SODIUM CHLORIDE 0.9 % IV SOLN: INTRAVENOUS | Status: DC

## 2019-12-25 MED ORDER — MEPERIDINE HCL 50 MG/ML IJ SOLN
INTRAMUSCULAR | Status: AC
  Filled 2019-12-25: qty 1

## 2019-12-25 MED ORDER — MIDAZOLAM HCL 5 MG/5ML IJ SOLN
INTRAMUSCULAR | Status: AC
  Filled 2019-12-25: qty 10

## 2019-12-25 MED ORDER — ONDANSETRON HCL 4 MG/2ML IJ SOLN
INTRAMUSCULAR | Status: DC | PRN
  Administered 2019-12-25: 4 mg via INTRAVENOUS

## 2019-12-25 MED ORDER — ONDANSETRON HCL 4 MG/2ML IJ SOLN
INTRAMUSCULAR | Status: AC
  Filled 2019-12-25: qty 2

## 2019-12-25 MED ORDER — STERILE WATER FOR IRRIGATION IR SOLN
Status: DC | PRN
  Administered 2019-12-25: 2.5 mL

## 2019-12-25 NOTE — Interval H&P Note (Signed)
History and Physical Interval Note:  12/25/2019 8:24 AM  Kayla [name]  has presented today for surgery, with the diagnosis of h/o colon polyps, FH CRC.  The various methods of treatment have been discussed with the patient and family. After consideration of risks, benefits and other options for treatment, the patient has consented to  Procedure(s) with comments: COLONOSCOPY (N/A) - 8:30am as a surgical intervention.  The patient's history has been reviewed, patient examined, no change in status, stable for surgery.  I have reviewed the patient's chart and labs.  Questions were answered to the patient's satisfaction.      No change.  Surveillance colonoscopy today per plan.  The risks, benefits, limitations, alternatives and imponderables have been reviewed with the patient. Questions have been answered. All parties are agreeable.    Manus Rudd

## 2019-12-25 NOTE — Discharge Instructions (Signed)
Colonoscopy Discharge Instructions  Read the instructions outlined below and refer to this sheet in the next few weeks. These discharge instructions provide you with general information on caring for yourself after you leave the hospital. Your doctor may also give you specific instructions. While your treatment has been planned according to the most current medical practices available, unavoidable complications occasionally occur. If you have any problems or questions after discharge, call Dr. Gala Romney at [phone]. ACTIVITY  You may resume your regular activity, but move at a slower pace for the next 24 hours.   Take frequent rest periods for the next 24 hours.   Walking will help get rid of the air and reduce the bloated feeling in your belly (abdomen).   No driving for 24 hours (because of the medicine (anesthesia) used during the test).    Do not sign any important legal documents or operate any machinery for 24 hours (because of the anesthesia used during the test).  NUTRITION  Drink plenty of fluids.   You may resume your normal diet as instructed by your doctor.   Begin with a light meal and progress to your normal diet. Heavy or fried foods are harder to digest and may make you feel sick to your stomach (nauseated).   Avoid alcoholic beverages for 24 hours or as instructed.  MEDICATIONS  You may resume your normal medications unless your doctor tells you otherwise.  WHAT YOU CAN EXPECT TODAY  Some feelings of bloating in the abdomen.   Passage of more gas than usual.   Spotting of blood in your stool or on the toilet paper.  IF YOU HAD POLYPS REMOVED DURING THE COLONOSCOPY:  No aspirin products for 7 days or as instructed.   No alcohol for 7 days or as instructed.   Eat a soft diet for the next 24 hours.  FINDING OUT THE RESULTS OF YOUR TEST Not all test results are available during your visit. If your test results are not back during the visit, make an appointment  with your caregiver to find out the results. Do not assume everything is normal if you have not heard from your caregiver or the medical facility. It is important for you to follow up on all of your test results.  SEEK IMMEDIATE MEDICAL ATTENTION IF:  You have more than a spotting of blood in your stool.   Your belly is swollen (abdominal distention).   You are nauseated or vomiting.   You have a temperature over 101.   You have abdominal pain or discomfort that is severe or gets worse throughout the day.    5 polyps removed in your colon today  Further recommendations to follow pending review of pathology report  At patient request I called Jacqualyn Posey at [phone] and reviewed results.   Colon Polyps  Polyps are tissue growths inside the body. Polyps can grow in many places, including the large intestine (colon). A polyp may be a round bump or a mushroom-shaped growth. You could have one polyp or several. Most colon polyps are noncancerous (benign). However, some colon polyps can become cancerous over time. Finding and removing the polyps early can help prevent this. What are the causes? The exact cause of colon polyps is not known. What increases the risk? You are more likely to develop this condition if you:  Have a family history of colon cancer or colon polyps.  Are older than 76 or older than 45 if you are African American.  Have  inflammatory bowel disease, such as ulcerative colitis or Crohn's disease.  Have certain hereditary conditions, such as: ? Familial adenomatous polyposis. ? Lynch syndrome. ? Turcot syndrome. ? Peutz-Jeghers syndrome.  Are overweight.  Smoke cigarettes.  Do not get enough exercise.  Drink too much alcohol.  Eat a diet that is high in fat and red meat and low in fiber.  Had childhood cancer that was treated with abdominal radiation. What are the signs or symptoms? Most polyps do not cause symptoms. If you have symptoms, they  may include:  Blood coming from your rectum when having a bowel movement.  Blood in your stool. The stool may look dark red or black.  Abdominal pain.  A change in bowel habits, such as constipation or diarrhea. How is this diagnosed? This condition is diagnosed with a colonoscopy. This is a procedure in which a lighted, flexible scope is inserted into the anus and then passed into the colon to examine the area. Polyps are sometimes found when a colonoscopy is done as part of routine cancer screening tests. How is this treated? Treatment for this condition involves removing any polyps that are found. Most polyps can be removed during a colonoscopy. Those polyps will then be tested for cancer. Additional treatment may be needed depending on the results of testing. Follow these instructions at home: Lifestyle  Maintain a healthy weight, or lose weight if recommended by your health care provider.  Exercise every day or as told by your health care provider.  Do not use any products that contain nicotine or tobacco, such as cigarettes and e-cigarettes. If you need help quitting, ask your health care provider.  If you drink alcohol, limit how much you have: ? 0-1 drink a day for women. ? 0-2 drinks a day for men.  Be aware of how much alcohol is in your drink. In the [place] one drink equals one 12 oz bottle of beer (355 mL), one 5 oz glass of wine (148 mL), or one 1 oz shot of hard liquor (44 mL). Eating and drinking   Eat foods that are high in fiber, such as fruits, vegetables, and whole grains.  Eat foods that are high in calcium and vitamin D, such as milk, cheese, yogurt, eggs, liver, fish, and broccoli.  Limit foods that are high in fat, such as fried foods and desserts.  Limit the amount of red meat and processed meat you eat, such as hot dogs, sausage, bacon, and lunch meats. General instructions  Keep all follow-up visits as told by your health care provider. This is  important. ? This includes having regularly scheduled colonoscopies. ? Talk to your health care provider about when you need a colonoscopy. Contact a health care provider if:  You have new or worsening bleeding during a bowel movement.  You have new or increased blood in your stool.  You have a change in bowel habits.  You lose weight for no known reason. Summary  Polyps are tissue growths inside the body. Polyps can grow in many places, including the colon.  Most colon polyps are noncancerous (benign), but some can become cancerous over time.  This condition is diagnosed with a colonoscopy.  Treatment for this condition involves removing any polyps that are found. Most polyps can be removed during a colonoscopy. This information is not intended to replace advice given to you by your health care provider. Make sure you discuss any questions you have with your health care provider. Document Revised: 10/31/2017 Document  Reviewed: 10/31/2017 Elsevier Patient Education  [employer].

## 2019-12-25 NOTE — Op Note (Signed)
[facility] Patient Name: Kayla [name] Procedure Date: 12/25/2019 8:16 AM MRN: [id number] Date of Birth: 1946/03/23 Attending MD: Norvel Richards , MD CSN: [id number] Age: 74 Admit Type: Outpatient Procedure:                Colonoscopy Indications:              High risk colon cancer surveillance: Personal                            history of colonic polyps Providers:                Norvel Richards, MD, Lurline Del, RN, Raphael Gibney Tech., Technician Referring MD:              Medicines:                Midazolam 6 mg IV, Meperidine 40 mg IV Complications:            No immediate complications. Estimated Blood Loss:     Estimated blood loss was minimal. Procedure:                Pre-Anesthesia Assessment:                           - Prior to the procedure, a History and Physical                            was performed, and patient medications and                            allergies were reviewed. The patient's tolerance of                            previous anesthesia was also reviewed. The risks                            and benefits of the procedure and the sedation                            options and risks were discussed with the patient.                            All questions were answered, and informed consent                            was obtained. Prior Anticoagulants: The patient has                            taken no previous anticoagulant or antiplatelet                            agents. ASA Grade Assessment: II - A patient with  mild systemic disease. After reviewing the risks                            and benefits, the patient was deemed in                            satisfactory condition to undergo the procedure.                           After obtaining informed consent, the colonoscope                            was passed under direct vision. Throughout the   procedure, the patient's blood pressure, pulse, and                            oxygen saturations were monitored continuously. The                            [id number] scope was introduced through                            the anus and advanced to the the cecum, identified                            by appendiceal orifice and ileocecal valve. The                            colonoscopy was performed without difficulty. The                            patient tolerated the procedure well. The quality                            of the bowel preparation was adequate. Scope In: 8:37:55 AM Scope Out: 8:54:50 AM Scope Withdrawal Time: 0 hours 11 minutes 54 seconds  Total Procedure Duration: 0 hours 16 minutes 55 seconds  Findings:      The perianal and digital rectal examinations were normal.      Five semi-pedunculated polyps were found in the descending colon and       ileocecal valve. The polyps were 3 to 7 mm in size.      The exam was otherwise without abnormality on direct and retroflexion       views. These polyps were removed with a cold snare. Resection and       retrieval were complete. Estimated blood loss was minimal. Impression:               - Five 3 to 7 mm polyps in the descending colon and                            at the ileocecal valve, removed with a cold snare.                            Resected and retrieved.                           -  The examination was otherwise normal on direct                            and retroflexion views. Moderate Sedation:      Moderate (conscious) sedation was administered by the endoscopy nurse       and supervised by the endoscopist. The following parameters were       monitored: oxygen saturation, heart rate, blood pressure, respiratory       rate, EKG, adequacy of pulmonary ventilation, and response to care.       Total physician intraservice time was 25 minutes. Recommendation:           - Patient has a contact number  available for                            emergencies. The signs and symptoms of potential                            delayed complications were discussed with the                            patient. Return to normal activities tomorrow.                            Written discharge instructions were provided to the                            patient.                           - Resume previous diet.                           - Continue present medications.                           - Repeat colonoscopy date to be determined after                            pending pathology results are reviewed for                            surveillance based on pathology results.                           - Return to GI office (date not yet determined). Procedure Code(s):        --- Professional ---                           [phone], Colonoscopy, flexible; with removal of                            tumor(s), polyp(s), or other lesion(s) by snare                            technique  [id number], Moderate sedation; each additional 15                            minutes intraservice time                           G0500, Moderate sedation services provided by the                            same physician or other qualified health care                            professional performing a gastrointestinal                            endoscopic service that sedation supports,                            requiring the presence of an independent trained                            observer to assist in the monitoring of the                            patient's level of consciousness and physiological                            status; initial 15 minutes of intra-service time;                            patient age 87 years or older (additional time may                            be reported with [phone], as appropriate) Diagnosis Code(s):        --- Professional ---                           Z86.010,  Personal history of colonic polyps                           K63.5, Polyp of colon CPT copyright 2019 [employer]. All rights reserved. The codes documented in this report are preliminary and upon coder review may  be revised to meet current compliance requirements. Cristopher Estimable. Zackrey Dyar, MD Norvel Richards, MD 12/25/2019 8:59:11 AM This report has been signed electronically. Number of Addenda: 0

## 2019-12-29 LAB — SURGICAL PATHOLOGY

## 2019-12-30 ENCOUNTER — Encounter: Payer: Self-pay | Admitting: Internal Medicine

## 2020-01-07 DIAGNOSIS — D126 Benign neoplasm of colon, unspecified: Secondary | ICD-10-CM

## 2020-01-13 ENCOUNTER — Ambulatory Visit: Payer: Medicare HMO | Admitting: Urology

## 2020-01-13 ENCOUNTER — Other Ambulatory Visit: Payer: Self-pay

## 2020-01-13 ENCOUNTER — Encounter: Payer: Self-pay | Admitting: Urology

## 2020-01-13 VITALS — BP 118/80 | HR 78 | Temp 97.9°F

## 2020-01-13 DIAGNOSIS — C672 Malignant neoplasm of lateral wall of bladder: Secondary | ICD-10-CM | POA: Diagnosis not present

## 2020-01-13 LAB — POCT URINALYSIS DIPSTICK
Bilirubin, UA: NEGATIVE
Blood, UA: NEGATIVE
Glucose, UA: NEGATIVE
Ketones, UA: NEGATIVE
Leukocytes, UA: NEGATIVE
Nitrite, UA: NEGATIVE
Protein, UA: NEGATIVE
Spec Grav, UA: 1.025 (ref 1.010–1.025)
Urobilinogen, UA: 0.2 E.U./dL
pH, UA: 5 (ref 5.0–8.0)

## 2020-01-13 MED ORDER — CIPROFLOXACIN HCL 500 MG PO TABS
500.0000 mg | ORAL_TABLET | Freq: Once | ORAL | Status: AC
  Administered 2020-01-13: 500 mg via ORAL

## 2020-01-13 NOTE — Patient Instructions (Signed)
Bladder Cancer  Bladder cancer is an abnormal growth of tissue in the bladder. The bladder is the balloon-like sac in the pelvis. It collects and stores urine that comes from the kidneys through the ureters. The bladder wall is made of layers. If cancer spreads into these layers and through the wall of the bladder, it becomes more difficult to treat. What are the causes? The cause of this condition is not known. What increases the risk? The following factors may make you more likely to develop this condition:  Smoking.  Workplace risks (occupational exposures), such as rubber, leather, textile, dyes, chemicals, and paint.  Being white.  Your age. Most people with bladder cancer are over the age of 55.  Being female.  Having chronic bladder inflammation.  Having a personal history of bladder cancer.  Having a family history of bladder cancer (heredity).  Having had chemotherapy or radiation therapy to the pelvis.  Having been exposed to arsenic. What are the signs or symptoms? Initial symptoms of this condition include:  Blood in the urine.  Painful urination.  Frequent bladder or urine infections.  Increase in urgency and frequency of urination. Advanced symptoms of this condition include:  Not being able to urinate.  Low back pain on one side.  Loss of appetite.  Weight loss.  Fatigue.  Swelling in the feet.  Bone pain. How is this diagnosed? This condition is diagnosed based on your medical history, a physical exam, urine tests, lab tests, imaging tests, and your symptoms. You may also have other tests or procedures done, such as:  A narrow tube being inserted into your bladder through your urethra (cystoscopy) in order to view the lining of your bladder for tumors.  A biopsy to sample the tumor to see if cancer is present. If cancer is present, it will then be staged to determine its severity and extent. Staging is an assessment of:  The size of the  tumor.  Whether the cancer has spread.  Where the cancer has spread. It is important to know how deeply into the bladder wall cancer has grown and whether cancer has spread to any other parts of your body. Staging may require blood tests or imaging tests, such as a CT scan, MRI, bone scan, or chest X-ray. How is this treated? Based on the stage of cancer, one treatment or a combination of treatments may be recommended. The most common forms of treatment are:  Surgery to remove the cancer. Procedures that may be done include transurethral resection and cystectomy.  Radiation therapy. This is high-energy X-rays or other particles. This is often used in combination with chemotherapy.  Chemotherapy. During this treatment, medicines are used to kill cancer cells.  Immunotherapy. This uses medicines to help your own immune system destroy cancer cells. Follow these instructions at home:  Take over-the-counter and prescription medicines only as told by your health care provider.  Maintain a healthy diet. Some of your treatments might affect your appetite.  Consider joining a support group. This may help you learn to cope with the stress of having bladder cancer.  Tell your cancer care team if you develop side effects. They may be able to recommend ways to relieve them.  Keep all follow-up visits as told by your health care provider. This is important. Where to find more information  American Cancer Society: www.cancer.org  National Cancer Institute (NCI): www.cancer.gov Contact a health care provider if:  You have symptoms of a urinary tract infection. These include: ?   Fever. ? Chills. ? Weakness. ? Muscle aches. ? Abdominal pain. ? Frequent and intense urge to urinate. ? Burning feeling in the bladder or urethra during urination. Get help right away if:  There is blood in your urine.  You cannot urinate.  You have severe pain or other symptoms that do not go  away. Summary  Bladder cancer is an abnormal growth of tissue in the bladder.  This condition is diagnosed based on your medical history, a physical exam, urine tests, lab tests, imaging tests, and your symptoms.  Based on the stage of cancer, surgery, chemotherapy, or a combination of treatments may be recommended.  Consider joining a support group. This may help you learn to cope with the stress of having bladder cancer. This information is not intended to replace advice given to you by your health care provider. Make sure you discuss any questions you have with your health care provider. Document Revised: 06/28/2017 Document Reviewed: 06/19/2016 Elsevier Patient Education  2020 Elsevier Inc.  

## 2020-01-13 NOTE — Progress Notes (Signed)
   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 BCG 08/2018  Blood pressure 118/80, pulse 78, temperature 97.9 F (36.6 C). NED. A&Ox3.   No respiratory distress   Abd soft, NT, ND Normal external genitalia with patent urethral meatus  Cystoscopy Procedure Note  Patient identification was confirmed, informed consent was obtained, and patient was prepped using Betadine solution.  Lidocaine jelly was administered per urethral meatus.    Procedure: - Flexible cystoscope introduced, without any difficulty.   - Thorough search of the bladder revealed:    normal urethral meatus    normal urothelium    no stones    no ulcers     no tumors    no urethral polyps    no trabeculation  - Ureteral orifices were normal in position and appearance.  Post-Procedure: - Patient tolerated the procedure well  Assessment/ Plan: Followup 3 months for cystoscopy. If she is clea rin 3 months we will start 6 month surveillance   No follow-ups on file.  Nicolette Bang, MD

## 2020-01-13 NOTE — Progress Notes (Signed)

## 2020-01-14 ENCOUNTER — Other Ambulatory Visit (HOSPITAL_COMMUNITY): Payer: Self-pay | Admitting: Family Medicine

## 2020-01-19 ENCOUNTER — Other Ambulatory Visit: Payer: Self-pay

## 2020-01-19 ENCOUNTER — Encounter: Payer: Self-pay | Admitting: Family Medicine

## 2020-01-19 ENCOUNTER — Ambulatory Visit (INDEPENDENT_AMBULATORY_CARE_PROVIDER_SITE_OTHER): Payer: Medicare HMO | Admitting: Family Medicine

## 2020-01-19 VITALS — BP 122/82 | Temp 97.6°F | Ht 66.5 in | Wt 192.2 lb

## 2020-01-19 DIAGNOSIS — D241 Benign neoplasm of right breast: Secondary | ICD-10-CM | POA: Diagnosis not present

## 2020-01-19 DIAGNOSIS — N644 Mastodynia: Secondary | ICD-10-CM

## 2020-01-19 NOTE — Progress Notes (Signed)
Patient ID: Kayla [name], female    DOB: 03-27-1946, 74 y.o.   MRN: [id number]   Chief Complaint  Patient presents with   tenderness in breast   Subjective:    HPI Patient arrives to discuss tenderness in left breast. Pt called to schedule he mammo and they told her she had to be seen to schedule mammo.  Pt stating taking tumeric been taking it for years. She wasn't sure if that was causing the pain, so she stopped the tumeric and the pain improved. Stopped the tumeric and only thing that changed in her meds. Hasn't had mammo in a while.  Not having the pain today.  Feeling better.  Felt the pain 2 wks ago. Then eased off last week.  On the right had biopsy in past 2x and they were negative, 2017.  last mammo in 2017.   Seeing urology- h/o malignant in bladder wall.  Removed 2 yrs ago. Going back q53mo and has been negative.  - colonoscopy - 1 mo ago had few polyps that were pre-cancerous. Told to return in 3 yrs for repeat.  Medical History Aston has a past medical history of Arthritis, Asthma, Benign brain tumor ([place]) (2002), Chest discomfort, GERD (gastroesophageal reflux disease), Hypertension, Impaired fasting glucose, Irritable bowel syndrome, Osteopenia, Tinnitus, and Venous insufficiency.   Outpatient Encounter Medications as of 01/19/2020  Medication Sig   albuterol (PROVENTIL HFA;VENTOLIN HFA) 108 (90 BASE) MCG/ACT inhaler Inhale 2 puffs into the lungs every 4 (four) hours as needed for wheezing.   Ascorbic Acid (VITAMIN C) 1000 MG tablet Take 1,000 mg by mouth daily.   Cholecalciferol (VITAMIN D) 2000 units tablet Take 2,000 Units by mouth daily.    Garlic (GARLIQUE) 177 MG TBEC Take by mouth.   hydrochlorothiazide (HYDRODIURIL) 12.5 MG tablet TAKE 1 TABLET BY MOUTH EVERY DAY (Patient taking differently: Take 12.5 mg by mouth daily. )   lisinopril (ZESTRIL) 10 MG tablet TAKE 1 TABLET BY MOUTH EVERY DAY (Patient taking differently: Take 10 mg by mouth daily. )     loratadine (CLARITIN) 10 MG tablet Take 10 mg by mouth daily as needed for allergies.   Melatonin 10 MG TABS Take 10 mg by mouth daily as needed (sleep).   meloxicam (MOBIC) 7.5 MG tablet TAKE 1 TABLET BY MOUTH EVERY DAY AS NEEDED FOR PAIN (Patient taking differently: Take 7.5 mg by mouth daily as needed for pain. )   TURMERIC PO Take 2.5 mLs by mouth daily. Liquid   No facility-administered encounter medications on file as of 01/19/2020.     Review of Systems  Constitutional: Negative for chills and fever.  HENT: Negative for congestion, rhinorrhea and sore throat.   Respiratory: Negative for cough, shortness of breath and wheezing.   Cardiovascular: Negative for chest pain and leg swelling.       +left breast pain   Gastrointestinal: Negative for abdominal pain, diarrhea, nausea and vomiting.  Genitourinary: Negative for dysuria and frequency.  Musculoskeletal: Negative for arthralgias and back pain.  Skin: Negative for rash.  Neurological: Negative for dizziness, weakness and headaches.     Vitals BP 122/82    Temp 97.6 F (36.4 C) (Oral)    Ht 5' 6.5" (1.689 m)    Wt 192 lb 3.2 oz (87.2 kg)    BMI 30.56 kg/m   Objective:   Physical Exam Vitals and nursing note reviewed.  Constitutional:      General: She is not in acute distress.    Appearance:  Normal appearance. She is not ill-appearing.  Pulmonary:     Effort: Pulmonary effort is normal. No respiratory distress.  Chest:     Comments: +left and right breast no masses on palpation. Normal inspection, no skin changes or discharge/bleeding from nipple.  No tenderness on palpation on left. No axillary lymphadenopathy Musculoskeletal:        General: Normal range of motion.  Skin:    General: Skin is warm and dry.     Findings: No rash.  Neurological:     General: No focal deficit present.     Mental Status: She is alert and oriented to person, place, and time.  Psychiatric:        Mood and Affect: Mood normal.       Assessment and Plan   1. Breast pain, left - US BREAST LTD UNI LEFT INC AXILLA - MM DIAG BREAST TOMO BILATERAL - US BREAST LTD UNI RIGHT INC AXILLA  2. Fibroadenoma of breast, right - US BREAST LTD UNI LEFT INC AXILLA - MM DIAG BREAST TOMO BILATERAL - US BREAST LTD UNI RIGHT INC AXILLA   Advising to cont to monitor for return of pain or skin changes or bleeding/discharge from nipple.  Pt in agreement.  Ordered diagnostic mammo and u/s left breast.  Will call pt with results.

## 2020-02-09 ENCOUNTER — Other Ambulatory Visit: Payer: Self-pay

## 2020-02-09 ENCOUNTER — Encounter (HOSPITAL_COMMUNITY): Payer: Self-pay

## 2020-02-09 ENCOUNTER — Ambulatory Visit (HOSPITAL_COMMUNITY): Admission: RE | Admit: 2020-02-09 | Payer: Medicare HMO | Source: Ambulatory Visit

## 2020-02-09 ENCOUNTER — Ambulatory Visit (HOSPITAL_COMMUNITY)
Admission: RE | Admit: 2020-02-09 | Discharge: 2020-02-09 | Disposition: A | Discharge status: Home / Self Care | Payer: Medicare HMO | Source: Ambulatory Visit | Attending: Family Medicine | Admitting: Family Medicine

## 2020-02-09 DIAGNOSIS — D241 Benign neoplasm of right breast: Secondary | ICD-10-CM | POA: Insufficient documentation

## 2020-02-09 DIAGNOSIS — N644 Mastodynia: Secondary | ICD-10-CM | POA: Insufficient documentation

## 2020-02-09 DIAGNOSIS — Z1231 Encounter for screening mammogram for malignant neoplasm of breast: Secondary | ICD-10-CM | POA: Insufficient documentation

## 2020-03-17 ENCOUNTER — Telehealth: Payer: Self-pay | Admitting: Family Medicine

## 2020-03-17 ENCOUNTER — Other Ambulatory Visit: Payer: Self-pay

## 2020-03-17 ENCOUNTER — Telehealth (INDEPENDENT_AMBULATORY_CARE_PROVIDER_SITE_OTHER): Payer: Medicare HMO | Admitting: Family Medicine

## 2020-03-17 DIAGNOSIS — J019 Acute sinusitis, unspecified: Secondary | ICD-10-CM

## 2020-03-17 MED ORDER — AMOXICILLIN 500 MG PO TABS: 500.0000 mg | ORAL_TABLET | Freq: Three times a day (TID) | ORAL | 0 refills | Status: DC

## 2020-03-17 NOTE — Telephone Encounter (Signed)
Ms. nature, kueker are scheduled for a virtual visit with your provider today.    Just as we do with appointments in the office, we must obtain your consent to participate.  Your consent will be active for this visit and any virtual visit you may have with one of our providers in the next 365 days.    If you have a MyChart account, I can also send a copy of this consent to you electronically.  All virtual visits are billed to your insurance company just like a traditional visit in the office.  As this is a virtual visit, video technology does not allow for your provider to perform a traditional examination.  This may limit your provider's ability to fully assess your condition.  If your provider identifies any concerns that need to be evaluated in person or the need to arrange testing such as labs, EKG, etc, we will make arrangements to do so.    Although advances in technology are sophisticated, we cannot ensure that it will always work on either your end or our end.  If the connection with a video visit is poor, we may have to switch to a telephone visit.  With either a video or telephone visit, we are not always able to ensure that we have a secure connection.   I need to obtain your verbal consent now.   Are you willing to proceed with your visit today?   Kayla [name] has provided verbal consent on 03/17/2020 for a virtual visit (video or telephone).   Vicente Males, LPN 1/74/0814  4:81 PM

## 2020-03-17 NOTE — Progress Notes (Signed)
   Subjective:    Patient ID: Kayla [name], female    DOB: [birth year], 74 y.o.   MRN: [id number]  HPI Patient has been having headache, ear ache, neck ache and sore throat for a couple of weeks. No fever but has felt flush at times. Has been taking Garlique and that seemed to be helping. 2 to 3 weeks of head congestion drainage coughing denies wheezing difficulty breathing relates a lot of postnasal drainage does not feel she has Covid has had vaccine Virtual Visit via Telephone Note  I connected with Kayla [name] on 03/17/20 at  3:30 PM EDT by telephone and verified that I am speaking with the correct person using two identifiers.  Location: Patient: home Provider: office   I discussed the limitations, risks, security and privacy concerns of performing an evaluation and management service by telephone and the availability of in person appointments. I also discussed with the patient that there may be a patient responsible charge related to this service. The patient expressed understanding and agreed to proceed.   History of Present Illness:    Observations/Objective:   Assessment and Plan:   Follow Up Instructions:    I discussed the assessment and treatment plan with the patient. The patient was provided an opportunity to ask questions and all were answered. The patient agreed with the plan and demonstrated an understanding of the instructions.   The patient was advised to call back or seek an in-person evaluation if the symptoms worsen or if the condition fails to improve as anticipated.  I provided 20 minutes of non-face-to-face time during this encounter.  Including documentation       Review of Systems    Please see above Objective:   Physical Exam   Today's visit was via telephone Physical exam was not possible for this visit      Assessment & Plan:  Acute rhinosinusitis Antibiotic prescribed Warning signs discussed follow-up if progressive troubles  follow-up sooner if any issues

## 2020-04-11 ENCOUNTER — Other Ambulatory Visit: Payer: Medicare HMO | Admitting: Urology

## 2020-04-14 ENCOUNTER — Other Ambulatory Visit: Payer: Medicare HMO | Admitting: Urology

## 2020-04-19 ENCOUNTER — Telehealth: Payer: Self-pay | Admitting: Family Medicine

## 2020-04-19 NOTE — Telephone Encounter (Signed)
Patient states she has had nausea, and right sided stomach pains on and off x 1 week. Pain is minimal more uncomfortable and usually occurs after eating. She has had bowel movements that she describes as loose and narrow, which isn't normal for her and she is using suppositories to be able to go. Wondering what she should do or how soon she should be seen.   Also had colonoscopy done Kayla [name] in the year and needs new referral to Dr. Sydell Axon so she can follow up with them.

## 2020-04-19 NOTE — Telephone Encounter (Signed)
Pt states for the last week she has been nauseous all the time and problems having BMs. Also having right side abd pain in front and back. We are booked this week pt would like to know what she needs to do.

## 2020-04-20 ENCOUNTER — Ambulatory Visit (HOSPITAL_COMMUNITY)
Admission: RE | Admit: 2020-04-20 | Discharge: 2020-04-20 | Disposition: A | Discharge status: Home / Self Care | Payer: Medicare HMO | Source: Ambulatory Visit | Attending: Family Medicine | Admitting: Family Medicine

## 2020-04-20 ENCOUNTER — Ambulatory Visit (INDEPENDENT_AMBULATORY_CARE_PROVIDER_SITE_OTHER): Payer: Medicare HMO | Admitting: Family Medicine

## 2020-04-20 ENCOUNTER — Other Ambulatory Visit: Payer: Self-pay

## 2020-04-20 ENCOUNTER — Other Ambulatory Visit (HOSPITAL_COMMUNITY)
Admission: RE | Admit: 2020-04-20 | Discharge: 2020-04-20 | Disposition: A | Discharge status: Home / Self Care | Payer: Medicare HMO | Source: Ambulatory Visit | Attending: Family Medicine | Admitting: Family Medicine

## 2020-04-20 ENCOUNTER — Encounter: Payer: Self-pay | Admitting: Family Medicine

## 2020-04-20 VITALS — BP 122/90 | HR 88 | Temp 96.1°F | Ht 66.5 in | Wt 193.0 lb

## 2020-04-20 DIAGNOSIS — R1011 Right upper quadrant pain: Secondary | ICD-10-CM

## 2020-04-20 DIAGNOSIS — D369 Benign neoplasm, unspecified site: Secondary | ICD-10-CM | POA: Diagnosis not present

## 2020-04-20 DIAGNOSIS — R195 Other fecal abnormalities: Secondary | ICD-10-CM | POA: Diagnosis not present

## 2020-04-20 DIAGNOSIS — Z8551 Personal history of malignant neoplasm of bladder: Secondary | ICD-10-CM

## 2020-04-20 DIAGNOSIS — K219 Gastro-esophageal reflux disease without esophagitis: Secondary | ICD-10-CM

## 2020-04-20 DIAGNOSIS — K8020 Calculus of gallbladder without cholecystitis without obstruction: Secondary | ICD-10-CM | POA: Diagnosis not present

## 2020-04-20 LAB — COMPREHENSIVE METABOLIC PANEL
ALT: 24 U/L (ref 0–44)
AST: 20 U/L (ref 15–41)
Albumin: 4.3 g/dL (ref 3.5–5.0)
Alkaline Phosphatase: 49 U/L (ref 38–126)
Anion gap: 11 (ref 5–15)
BUN: 16 mg/dL (ref 8–23)
CO2: 25 mmol/L (ref 22–32)
Calcium: 9.1 mg/dL (ref 8.9–10.3)
Chloride: 101 mmol/L (ref 98–111)
Creatinine, Ser: 0.89 mg/dL (ref 0.44–1.00)
GFR calc Af Amer: >60 mL/min (ref >60)
GFR calc non Af Amer: >60 mL/min (ref >60)
Glucose, Bld: 104 mg/dL — ABNORMAL HIGH (ref 70–99)
Potassium: 4.1 mmol/L (ref 3.5–5.1)
Sodium: 137 mmol/L (ref 135–145)
Total Bilirubin: 0.8 mg/dL (ref 0.3–1.2)
Total Protein: 6.8 g/dL (ref 6.5–8.1)

## 2020-04-20 LAB — CBC WITH DIFFERENTIAL/PLATELET
Abs Immature Granulocytes: 0.01 10*3/uL (ref 0.00–0.07)
Basophils Absolute: 0.1 10*3/uL (ref 0.0–0.1)
Basophils Relative: 1 %
Eosinophils Absolute: 0.3 10*3/uL (ref 0.0–0.5)
Eosinophils Relative: 4 %
HCT: 43.8 % (ref 36.0–46.0)
Hemoglobin: 14.4 g/dL (ref 12.0–15.0)
Immature Granulocytes: 0 %
Lymphocytes Relative: 34 %
Lymphs Abs: 2.3 10*3/uL (ref 0.7–4.0)
MCH: 30.8 pg (ref 26.0–34.0)
MCHC: 32.9 g/dL (ref 30.0–36.0)
MCV: 93.6 fL (ref 80.0–100.0)
Monocytes Absolute: 0.4 10*3/uL (ref 0.1–1.0)
Monocytes Relative: 6 %
Neutro Abs: 3.6 10*3/uL (ref 1.7–7.7)
Neutrophils Relative %: 55 %
Platelets: 213 10*3/uL (ref 150–400)
RBC: 4.68 MIL/uL (ref 3.87–5.11)
RDW: 11.9 % (ref 11.5–15.5)
WBC: 6.6 10*3/uL (ref 4.0–10.5)
nRBC: 0 % (ref 0.0–0.2)

## 2020-04-20 LAB — LIPASE, BLOOD: Lipase: 40 U/L (ref 11–51)

## 2020-04-20 MED ORDER — ONDANSETRON 4 MG PO TBDP: 4.0000 mg | ORAL_TABLET | Freq: Three times a day (TID) | ORAL | 0 refills | Status: DC | PRN

## 2020-04-20 NOTE — Progress Notes (Signed)
Patient ID: Kayla [name], female    DOB: 27-Apr-1946, 74 y.o.   MRN: [id number]   Chief Complaint  Patient presents with  . Abdominal Pain   Subjective:    HPI Pt having right mid abdominal pain. Also lower right side back pain. On and off for a while. Nauseated.  Going on for several months. No vomiting or fever.  Constant nausea, and pain in epigastric area also. Having pain in back on rt side, worsneing. Had colonsocopy earlier this year and was "precancerous" and dx pre tubular adenoma. With Dr. Sydell Axon, GI.  Has h/o gerd and feeling more reflux recently. Not on antacids for gerd.  noticing over last 6 months, thin stools and needing to use suppository to go to have BM. Since the colonoscopy it's gotten thinner.  Normal color, no blood.  Medical History Zakariah has a past medical history of Arthritis, Asthma, Benign brain tumor ([place]) (2002), Chest discomfort, GERD (gastroesophageal reflux disease), Hypertension, Impaired fasting glucose, Irritable bowel syndrome, Osteopenia, Tinnitus, and Venous insufficiency.   Outpatient Encounter Medications as of 04/20/2020  Medication Sig  . albuterol (PROVENTIL HFA;VENTOLIN HFA) 108 (90 BASE) MCG/ACT inhaler Inhale 2 puffs into the lungs every 4 (four) hours as needed for wheezing.  Marland Kitchen amoxicillin (AMOXIL) 500 MG tablet Take 1 tablet (500 mg total) by mouth 3 (three) times daily.  . Ascorbic Acid (VITAMIN C) 1000 MG tablet Take 1,000 mg by mouth daily.  . Cholecalciferol (VITAMIN D) 2000 units tablet Take 2,000 Units by mouth daily.   . Garlic (GARLIQUE) 976 MG TBEC Take by mouth.  . hydrochlorothiazide (HYDRODIURIL) 12.5 MG tablet TAKE 1 TABLET BY MOUTH EVERY DAY (Patient taking differently: Take 12.5 mg by mouth daily. )  . lisinopril (ZESTRIL) 10 MG tablet TAKE 1 TABLET BY MOUTH EVERY DAY (Patient taking differently: Take 10 mg by mouth daily. )  . loratadine (CLARITIN) 10 MG tablet Take 10 mg by mouth daily as needed for  allergies.  . Melatonin 10 MG TABS Take 10 mg by mouth daily as needed (sleep).  . meloxicam (MOBIC) 7.5 MG tablet TAKE 1 TABLET BY MOUTH EVERY DAY AS NEEDED FOR PAIN (Patient taking differently: Take 7.5 mg by mouth daily as needed for pain. )  . TURMERIC PO Take 2.5 mLs by mouth daily. Liquid  . ondansetron (ZOFRAN-ODT) 4 MG disintegrating tablet Take 1 tablet (4 mg total) by mouth every 8 (eight) hours as needed for nausea or vomiting.   No facility-administered encounter medications on file as of 04/20/2020.     Review of Systems  Constitutional: Negative for chills and fever.  HENT: Negative for congestion, rhinorrhea and sore throat.   Respiratory: Negative for cough, shortness of breath and wheezing.   Cardiovascular: Negative for chest pain and leg swelling.  Gastrointestinal: Positive for abdominal pain (rt upper quad,) and nausea. Negative for blood in stool, constipation, diarrhea and vomiting.       +thin stools  Genitourinary: Negative for dysuria and frequency.  Musculoskeletal: Negative for arthralgias and back pain.  Skin: Negative for rash.  Neurological: Negative for dizziness, weakness and headaches.     Vitals BP 122/90   Pulse 88   Temp (!) 96.1 F (35.6 C)   Ht 5' 6.5" (1.689 m)   Wt 193 lb (87.5 kg)   SpO2 95%   BMI 30.68 kg/m   Objective:   Physical Exam Vitals and nursing note reviewed.  Constitutional:      General: She is  not in acute distress.    Appearance: Normal appearance. She is not ill-appearing.  HENT:     Head: Normocephalic and atraumatic.     Nose: Nose normal.     Mouth/Throat:     Mouth: Mucous membranes are moist.     Pharynx: Oropharynx is clear.  Eyes:     Extraocular Movements: Extraocular movements intact.     Conjunctiva/sclera: Conjunctivae normal.     Pupils: Pupils are equal, round, and reactive to light.  Cardiovascular:     Rate and Rhythm: Normal rate and regular rhythm.     Pulses: Normal pulses.     Heart  sounds: Normal heart sounds.  Pulmonary:     Effort: Pulmonary effort is normal.     Breath sounds: Normal breath sounds. No wheezing, rhonchi or rales.  Abdominal:     General: There is no distension.     Palpations: Abdomen is soft. There is no hepatomegaly or mass.     Tenderness: There is abdominal tenderness in the right upper quadrant. There is no guarding or rebound. Positive signs include Murphy's sign. Negative signs include McBurney's sign.     Hernia: No hernia is present.  Musculoskeletal:        General: Normal range of motion.     Right lower leg: No edema.     Left lower leg: No edema.  Skin:    General: Skin is warm and dry.     Findings: No lesion or rash.  Neurological:     General: No focal deficit present.     Mental Status: She is alert and oriented to person, place, and time.     Cranial Nerves: No cranial nerve deficit.  Psychiatric:        Mood and Affect: Mood normal.        Behavior: Behavior normal.      Assessment and Plan   1. Right upper quadrant pain - US Abdomen Limited RUQ; Future - Lipase - ondansetron (ZOFRAN-ODT) 4 MG disintegrating tablet; Take 1 tablet (4 mg total) by mouth every 8 (eight) hours as needed for nausea or vomiting.  Dispense: 20 tablet; Refill: 0 - CBC with Differential/Platelet - Lipase - Comprehensive Metabolic Panel (CMET)  2. Tubular adenoma - Ambulatory referral to Gastroenterology  3. Change in stool caliber - Ambulatory referral to Gastroenterology  4. History of bladder cancer - Ambulatory referral to Gastroenterology  5. Gastroesophageal reflux disease, unspecified whether esophagitis present   RUQ pain- pt to take tylenol prn and avoid greasy/fatty foods.  Pt having nausea, gave zofran. Labs and u/s ordered for ruq abd pain.  Pt having dec stool caliber, h/o tubular adenoma and h/o bladder cancer- ref GI for colonoscopy and further evaluation.  Referral placed for general surgery for concern of  intermittent gallbladder pain.   F/u prn after getting images/labs, referral.

## 2020-04-20 NOTE — Telephone Encounter (Signed)
Patient notified

## 2020-04-20 NOTE — Telephone Encounter (Signed)
Left another message for pt to return call

## 2020-04-20 NOTE — Telephone Encounter (Signed)
Can she come today at 11:15 or tomorrow at 11:15?  Not a slot on the schedule so would need to double book the 11am slot.  Thx. Dr. Lovena Le

## 2020-04-20 NOTE — Telephone Encounter (Signed)
Lmtc

## 2020-04-20 NOTE — Patient Instructions (Signed)
zofran for nausea as needed 3x per day.  Call the GI office for follow up on the stool changes.

## 2020-04-21 ENCOUNTER — Encounter: Payer: Self-pay | Admitting: Family Medicine

## 2020-04-21 ENCOUNTER — Other Ambulatory Visit: Payer: Self-pay | Admitting: Family Medicine

## 2020-04-21 DIAGNOSIS — K8020 Calculus of gallbladder without cholecystitis without obstruction: Secondary | ICD-10-CM

## 2020-04-22 ENCOUNTER — Telehealth: Payer: Self-pay | Admitting: Family Medicine

## 2020-04-22 ENCOUNTER — Encounter: Payer: Self-pay | Admitting: Family Medicine

## 2020-04-22 MED ORDER — LISINOPRIL 10 MG PO TABS
10.0000 mg | ORAL_TABLET | Freq: Every day | ORAL | 1 refills | Status: DC
Start: 2020-04-22 — End: 2020-10-18

## 2020-04-22 MED ORDER — HYDROCHLOROTHIAZIDE 12.5 MG PO TABS
12.5000 mg | ORAL_TABLET | Freq: Every day | ORAL | 1 refills | Status: DC
Start: 2020-04-22 — End: 2020-10-18

## 2020-04-22 NOTE — Telephone Encounter (Signed)
[facility] [place] requesting refill on Lisinopril 10 mg and HCTZ 12.5 mg tablet. Pt last seen 04/20/20 for RUQ. Please advise. Thank you

## 2020-04-22 NOTE — Addendum Note (Signed)
Addended by: Erven Colla on: 04/22/2020 05:21 PM   Modules accepted: Orders

## 2020-04-26 ENCOUNTER — Ambulatory Visit: Payer: Medicare HMO | Admitting: Gastroenterology

## 2020-04-26 ENCOUNTER — Encounter: Payer: Self-pay | Admitting: Gastroenterology

## 2020-04-26 ENCOUNTER — Other Ambulatory Visit: Payer: Self-pay

## 2020-04-26 DIAGNOSIS — R194 Change in bowel habit: Secondary | ICD-10-CM

## 2020-04-26 DIAGNOSIS — R1013 Epigastric pain: Secondary | ICD-10-CM | POA: Insufficient documentation

## 2020-04-26 MED ORDER — PANTOPRAZOLE SODIUM 40 MG PO TBEC: 40.0000 mg | DELAYED_RELEASE_TABLET | Freq: Every day | ORAL | 3 refills | Status: DC

## 2020-04-26 NOTE — Progress Notes (Signed)
Referring Provider: Erven Colla, DO Primary Care Physician:  Erven Colla, DO Primary GI: Dr. Gala Romney  Chief Complaint  Patient presents with   change in stool    more soft/liquid like than formed   Gas   Bloated    HPI:   Kayla [name] is a 74 y.o. female presenting today with a history of GERD, adenomas with next surveillance due in 2023, here for change in bowel habits.   Notes feeling full early. Bloating with eating. Then starving to death 2 hours later. [place] stool scale # 5-6, unproductive. Gassy prior. Has had to rock back and forth to go as well. Prescribed Zofran at PCP office, which didn't help. Nausea intermittently. Has pain in lower back for about a week. +GERD. No PPI.   Onset of change in bowel habits several months ago. No changes in medication. Feels nauseated when taking medications. Pepcid AC is only thing that helps. Taking Benefiber here and there. Doesn't take meloxicam. Has only taken Motrin occasionally. Aspirin hurts her stomach. Doesn't want an endoscopy unless absolutely necessary.   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 Gala Romney: Multiple colonic polyps removed, tubular adenomas.  Next colonoscopy in 5 years   COLONOSCOPY N/A 12/25/2019   five semi-pedunculated polyps in descending colon and IC valve, 3-7 mm in size. Tubular adenomas. 3 year surveillance.    CYSTOSCOPY W/ URETERAL STENT PLACEMENT Bilateral 05/05/2018    Procedure: CYSTOSCOPY WITH BILATERAL RETROGRADE PYELOGRAM/ RIGHT URETERAL STENT PLACEMENT;  Surgeon: Cleon Gustin, MD;  Location: [facility];  Service: Urology;  Laterality: Bilateral;   LUMBAR DISC SURGERY     PARTIAL HYSTERECTOMY     POLYPECTOMY  12/25/2019   Procedure: POLYPECTOMY;  Surgeon: Daneil Dolin, MD;  Location: [facility];  Service: Endoscopy;;  ileocecal valve polyp; descending x2;   TRANSURETHRAL RESECTION OF BLADDER TUMOR N/A 05/05/2018   Procedure: TRANSURETHRAL RESECTION OF BLADDER TUMOR (TURBT);  Surgeon: Cleon Gustin, MD;  Location: [facility];  Service: Urology;  Laterality: N/A;   TRANSURETHRAL RESECTION OF BLADDER TUMOR N/A 06/16/2018   Procedure: TRANSURETHRAL RESECTION OF BLADDER TUMOR (TURBT);  Surgeon: Cleon Gustin, MD;  Location: [facility];  Service: Urology;  Laterality: N/A;    Current Outpatient Medications  Medication Sig Dispense Refill   Ascorbic Acid (VITAMIN C) 1000 MG tablet Take 1,000 mg by mouth daily.     Cholecalciferol (VITAMIN D) 2000 units tablet Take 2,000 Units by mouth daily. When she remembers     Famotidine (PEPCID AC PO) Take by mouth. As needed     Garlic (GARLIQUE) 496 MG TBEC Take by mouth. 2-3 times per week     hydrochlorothiazide (HYDRODIURIL) 12.5 MG tablet Take 1 tablet (12.5 mg total) by mouth daily. 90 tablet 1   lisinopril (ZESTRIL) 10 MG  tablet Take 1 tablet (10 mg total) by mouth daily. 90 tablet 1   loratadine (CLARITIN) 10 MG tablet Take 10 mg by mouth daily as needed for allergies.     Melatonin 10 MG TABS Take 10 mg by mouth daily as needed (sleep).     meloxicam (MOBIC) 7.5 MG tablet TAKE 1 TABLET BY MOUTH EVERY DAY AS NEEDED FOR PAIN (Patient taking differently: Take 7.5 mg by mouth daily as needed for pain. ) 30 tablet 5   TURMERIC PO Take 2.5 mLs by mouth daily. Liquid  1-2 times per week     pantoprazole (PROTONIX) 40 MG tablet Take 1 tablet (40 mg total) by mouth daily. 30 minutes before  breakfast 30 tablet 3   No current facility-administered medications for this visit.    Allergies as of 04/26/2020 - Review Complete 04/26/2020  Allergen Reaction Noted   Verapamil Swelling 07/19/2013    Family History  Problem Relation Age of Onset   Cancer Father        colon cancer; also brother-colon And sister-breast   Heart disease Father    Heart disease Mother        at advanced age   Hypertension Mother    Hyperlipidemia Mother    Osteoporosis Mother    Cancer Brother        liver and colon   Breast cancer Sister    Cancer Sister 30       breast   Breast cancer Sister    Breast cancer Paternal Aunt     Social History   Socioeconomic History   Marital status: Married    Spouse name: Not on file   Number of children: 2   Years of education: Not on file   Highest education level: Not on file  Occupational History   Occupation: Technical sales engineer    Comment: Laundry, Pharmacist, community  Tobacco Use   Smoking status: Never Smoker   Smokeless tobacco: Never Used  Substance and Sexual Activity   Alcohol use: Yes    Alcohol/week: 2.0 standard drinks    Types: 2 Glasses of wine per week    Comment: occ wine   Drug use: No   Sexual activity: Not Currently    Birth control/protection: Post-menopausal, Surgical  Other Topics Concern   Not on file  Social History Narrative   Exercises regularly   Social Determinants of Health   Financial Resource Strain:    Difficulty of Paying Living Expenses: Not on file  Food Insecurity:    Worried About Charity fundraiser in the Last Year: Not on file   [employer] of Food in the Last Year: Not on file  Transportation Needs:    Lack of Transportation (Medical): Not on file   Lack of Transportation (Non-Medical): Not on file  Physical Activity:    Days of Exercise per Week: Not on file   Minutes of Exercise per Session: Not on file  Stress:    Feeling of Stress : Not on file  Social Connections:     Frequency of Communication with Friends and Family: Not on file   Frequency of Social Gatherings with Friends and Family: Not on file   Attends Religious Services: Not on file   Active Member of Clubs or Organizations: Not on file   Attends Archivist Meetings: Not on file   Marital Status: Not on file    Review of Systems: Gen: Denies fever, chills, anorexia. Denies fatigue, weakness, weight loss.  CV: Denies chest pain, palpitations, syncope, peripheral edema, and claudication. Resp: Denies dyspnea at rest, cough, wheezing, coughing up blood, and pleurisy. GI: see HPI Derm: Denies rash, itching, dry skin Psych: Denies depression, anxiety, memory loss, confusion. No homicidal or suicidal ideation.  Heme: Denies bruising, bleeding, and enlarged lymph nodes.  Physical Exam: BP 125/82    Pulse 79    Temp 97.7 F (36.5 C) (Oral)    Ht 5\' 7"  (1.702 m)    Wt 193 lb 12.8 oz (87.9 kg)    BMI 30.35 kg/m  General:   Alert and oriented. No distress noted. Pleasant and cooperative.  Head:  Normocephalic and atraumatic. Eyes:  Conjuctiva clear without scleral icterus. Mouth:  Mask in place Abdomen:  +BS, soft, mild discomfort upper abdomen and non-distended. No rebound or guarding. No HSM or masses noted. Msk:  Symmetrical without gross deformities. Normal posture. Extremities:  Without edema. Neurologic:  Alert and  oriented x4 Psych:  Alert and cooperative. Normal mood and affect.  ASSESSMENT: Kayla [name] is a 74 y.o. female presenting today with change in bowel habits and chronic GERD.   She notes unproductive stools, bloating, and reassuringly had a colonoscopy this year with next surveillance in 3 years. No alarm signs/symptoms. Will trial added Benefiber daily instead of sporadically. She is to call with update.  Early satiety and nausea noted, with uncontrolled GERD. No dysphagia. Currently not on a PPI but has noted improvement with Pepcid. Will trial Protonix  once daily. She is to call if persistent and will pursue EGD.    PLAN:  Protonix once daily  Benefiber daily  Call with progress report  Return in 3 months regardless  Annitta Needs, PhD, Operating Room Services [facility]astro[facility]

## 2020-04-26 NOTE — Patient Instructions (Signed)
I recommend starting Protonix once each morning, 30 minutes before breakfast on an empty stomach for best absorption. You can stop Pepcid while taking this.  Let's start Benefiber 2 teaspoons each morning mixed in your beverage of choice.  Let us know in about 2 weeks how you are doing!  We will see you in 3 months!   It was a pleasure to see you today. I want to create trusting relationships with patients to provide genuine, compassionate, and quality care. I value your feedback. If you receive a survey regarding your visit,  I greatly appreciate you taking time to fill this out.   Annitta Needs, PhD, ANP-BC [facility] Gastroenterology

## 2020-05-04 ENCOUNTER — Other Ambulatory Visit: Payer: Self-pay

## 2020-05-04 ENCOUNTER — Ambulatory Visit (INDEPENDENT_AMBULATORY_CARE_PROVIDER_SITE_OTHER): Payer: Medicare HMO | Admitting: Urology

## 2020-05-04 ENCOUNTER — Encounter: Payer: Self-pay | Admitting: Urology

## 2020-05-04 VITALS — BP 133/83 | HR 76 | Temp 98.1°F | Ht 67.0 in | Wt 193.8 lb

## 2020-05-04 DIAGNOSIS — C672 Malignant neoplasm of lateral wall of bladder: Secondary | ICD-10-CM

## 2020-05-04 LAB — URINALYSIS, ROUTINE W REFLEX MICROSCOPIC
Bilirubin, UA: NEGATIVE
Glucose, UA: NEGATIVE
Ketones, UA: NEGATIVE
Leukocytes,UA: NEGATIVE
Nitrite, UA: NEGATIVE
Protein,UA: NEGATIVE
RBC, UA: NEGATIVE
Specific Gravity, UA: 1.025 (ref 1.005–1.030)
Urobilinogen, Ur: 0.2 mg/dL (ref 0.2–1.0)
pH, UA: 6 (ref 5.0–7.5)

## 2020-05-04 MED ORDER — CIPROFLOXACIN HCL 500 MG PO TABS
500.0000 mg | ORAL_TABLET | Freq: Once | ORAL | Status: AC
  Administered 2020-05-04: 500 mg via ORAL

## 2020-05-04 NOTE — Progress Notes (Signed)
   05/04/20  CC: hx of bladder cancer  HPI: Ms Manthei is a 74yo here for followup for bladder cancer. No hematuria or dysuria  Blood pressure 133/83, pulse 76, temperature 98.1 F (36.7 C), height 5\' 7"  (1.702 m), weight 193 lb 12.8 oz (87.9 kg). NED. A&Ox3.   No respiratory distress   Abd soft, NT, ND Normal external genitalia with patent urethral meatus  Cystoscopy Procedure Note  Patient identification was confirmed, informed consent was obtained, and patient was prepped using Betadine solution.  Lidocaine jelly was administered per urethral meatus.    Procedure: - Flexible cystoscope introduced, without any difficulty.   - Thorough search of the bladder revealed:    normal urethral meatus    normal urothelium    no stones    no ulcers     no tumors    no urethral polyps    no trabeculation  - Ureteral orifices were normal in position and appearance.  Post-Procedure: - Patient tolerated the procedure well  Assessment/ Plan: Start maintenance BCG in 1-2 weeks. RTC 3 months for cystoscopy   Return in about 3 months (around 08/04/2020), or BCG maintenence for 3 weeks starting in 1-2 weeks, for cystoscopy.  Nicolette Bang, MD

## 2020-05-04 NOTE — Progress Notes (Signed)
Urological Symptom Review  Patient is experiencing the following symptoms: Frequent urination Get up at night to urinate   Review of Systems  Gastrointestinal (upper)  : Negative for upper GI symptoms  Gastrointestinal (lower) : Negative for lower GI symptoms  Constitutional : Negative for symptoms  Skin: Negative for skin symptoms  Eyes: Negative for eye symptoms  Ear/Nose/Throat : Negative for Ear/Nose/Throat symptoms  Hematologic/Lymphatic: Negative for Hematologic/Lymphatic symptoms  Cardiovascular : Negative for cardiovascular symptoms  Respiratory : Shortness of breath  Endocrine: Negative for endocrine symptoms  Musculoskeletal: Back pain Joint pain  Neurological: Negative for neurological symptoms  Psychologic: Negative for psychiatric symptoms

## 2020-05-04 NOTE — Patient Instructions (Signed)
Bladder Cancer  Bladder cancer is an abnormal growth of tissue in the bladder. The bladder is the balloon-like sac in the pelvis. It collects and stores urine that comes from the kidneys through the ureters. The bladder wall is made of layers. If cancer spreads into these layers and through the wall of the bladder, it becomes more difficult to treat. What are the causes? The cause of this condition is not known. What increases the risk? The following factors may make you more likely to develop this condition:  Smoking.  Workplace risks (occupational exposures), such as rubber, leather, textile, dyes, chemicals, and paint.  Being white.  Your age. Most people with bladder cancer are over the age of 55.  Being female.  Having chronic bladder inflammation.  Having a personal history of bladder cancer.  Having a family history of bladder cancer (heredity).  Having had chemotherapy or radiation therapy to the pelvis.  Having been exposed to arsenic. What are the signs or symptoms? Initial symptoms of this condition include:  Blood in the urine.  Painful urination.  Frequent bladder or urine infections.  Increase in urgency and frequency of urination. Advanced symptoms of this condition include:  Not being able to urinate.  Low back pain on one side.  Loss of appetite.  Weight loss.  Fatigue.  Swelling in the feet.  Bone pain. How is this diagnosed? This condition is diagnosed based on your medical history, a physical exam, urine tests, lab tests, imaging tests, and your symptoms. You may also have other tests or procedures done, such as:  A narrow tube being inserted into your bladder through your urethra (cystoscopy) in order to view the lining of your bladder for tumors.  A biopsy to sample the tumor to see if cancer is present. If cancer is present, it will then be staged to determine its severity and extent. Staging is an assessment of:  The size of the  tumor.  Whether the cancer has spread.  Where the cancer has spread. It is important to know how deeply into the bladder wall cancer has grown and whether cancer has spread to any other parts of your body. Staging may require blood tests or imaging tests, such as a CT scan, MRI, bone scan, or chest X-ray. How is this treated? Based on the stage of cancer, one treatment or a combination of treatments may be recommended. The most common forms of treatment are:  Surgery to remove the cancer. Procedures that may be done include transurethral resection and cystectomy.  Radiation therapy. This is high-energy X-rays or other particles. This is often used in combination with chemotherapy.  Chemotherapy. During this treatment, medicines are used to kill cancer cells.  Immunotherapy. This uses medicines to help your own immune system destroy cancer cells. Follow these instructions at home:  Take over-the-counter and prescription medicines only as told by your health care provider.  Maintain a healthy diet. Some of your treatments might affect your appetite.  Consider joining a support group. This may help you learn to cope with the stress of having bladder cancer.  Tell your cancer care team if you develop side effects. They may be able to recommend ways to relieve them.  Keep all follow-up visits as told by your health care provider. This is important. Where to find more information  American Cancer Society: www.cancer.org  National Cancer Institute (NCI): www.cancer.gov Contact a health care provider if:  You have symptoms of a urinary tract infection. These include: ?   Fever. ? Chills. ? Weakness. ? Muscle aches. ? Abdominal pain. ? Frequent and intense urge to urinate. ? Burning feeling in the bladder or urethra during urination. Get help right away if:  There is blood in your urine.  You cannot urinate.  You have severe pain or other symptoms that do not go  away. Summary  Bladder cancer is an abnormal growth of tissue in the bladder.  This condition is diagnosed based on your medical history, a physical exam, urine tests, lab tests, imaging tests, and your symptoms.  Based on the stage of cancer, surgery, chemotherapy, or a combination of treatments may be recommended.  Consider joining a support group. This may help you learn to cope with the stress of having bladder cancer. This information is not intended to replace advice given to you by your health care provider. Make sure you discuss any questions you have with your health care provider. Document Revised: 06/28/2017 Document Reviewed: 06/19/2016 Elsevier Patient Education  2020 Elsevier Inc.  

## 2020-05-12 ENCOUNTER — Ambulatory Visit: Payer: Medicare HMO | Admitting: General Surgery

## 2020-05-12 ENCOUNTER — Other Ambulatory Visit: Payer: Self-pay

## 2020-05-12 ENCOUNTER — Encounter: Payer: Self-pay | Admitting: General Surgery

## 2020-05-12 VITALS — BP 134/84 | HR 73 | Temp 98.5°F | Resp 16 | Ht 67.0 in | Wt 193.0 lb

## 2020-05-12 DIAGNOSIS — K8020 Calculus of gallbladder without cholecystitis without obstruction: Secondary | ICD-10-CM

## 2020-05-12 NOTE — Progress Notes (Signed)
Kayla [name]; [id number]; 12-31-1945   HPI Patient is a 74 year old white female who was referred to my care by Dr. Lovena Le for evaluation and treatment of cholelithiasis.  This was found on an ultrasound of the right upper quadrant.  Patient has been seen by Roseanne Kaufman of gastroenterology for reflux disease.  Patient denies any fatty food intolerance.  She does have some right upper back pain, but she thinks that is due to muscular strain from mowing.  She does have reflux disease but states that it is stable.  She denies any fever, chills, or jaundice.  Her appetite is normal.  She was recently diagnosed with bladder cancer and is undergoing treatment for that. Past Medical History:  Diagnosis Date  . Arthritis   . Asthma    excercise induced  . 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 Chest discomfort   . GERD (gastroesophageal reflux disease)   . Hypertension   . Impaired fasting glucose   . Irritable bowel syndrome   . Osteopenia   . Tinnitus   . Venous insufficiency     Past Surgical History:  Procedure Laterality Date  . ABDOMINAL HYSTERECTOMY    . APPENDECTOMY    . BREAST BIOPSY Right   . CARPAL TUNNEL RELEASE     Bilateral  . COLONOSCOPY N/A 09/15/2014   Dr. Gala Romney: Multiple colonic polyps removed, tubular adenomas.  Next colonoscopy in 5 years  . COLONOSCOPY N/A 12/25/2019   five semi-pedunculated polyps in descending colon and IC valve, 3-7 mm in size. Tubular adenomas. 3 year surveillance.   . CYSTOSCOPY W/ URETERAL STENT PLACEMENT Bilateral 05/05/2018   Procedure: CYSTOSCOPY WITH BILATERAL RETROGRADE PYELOGRAM/ RIGHT URETERAL STENT PLACEMENT;  Surgeon: Cleon Gustin, MD;  Location: [facility];  Service: Urology;  Laterality: Bilateral;  . LUMBAR DISC SURGERY    . PARTIAL HYSTERECTOMY    . POLYPECTOMY  12/25/2019   Procedure: POLYPECTOMY;  Surgeon: Daneil Dolin, MD;  [facility];  Service: Endoscopy;;  ileocecal valve polyp;  descending x2;  . TRANSURETHRAL RESECTION OF BLADDER TUMOR N/A 05/05/2018   Procedure: TRANSURETHRAL RESECTION OF BLADDER TUMOR (TURBT);  Surgeon: Cleon Gustin, MD;  Location: [facility];  Service: Urology;  Laterality: N/A;  . TRANSURETHRAL RESECTION OF BLADDER TUMOR N/A 06/16/2018   Procedure: TRANSURETHRAL RESECTION OF BLADDER TUMOR (TURBT);  Surgeon: Cleon Gustin, MD;  Location: [facility];  Service: Urology;  Laterality: N/A;    Family History  Problem Relation Age of Onset  . Cancer Father        colon cancer; also brother-colon And sister-breast  . Heart disease Father   . Heart disease Mother        at advanced age  . Hypertension Mother   . Hyperlipidemia Mother   . Osteoporosis Mother   . Cancer Brother        liver and colon  . Breast cancer Sister   . Cancer Sister 45       breast  . Breast cancer Sister   . Breast cancer Paternal Aunt     Current Outpatient Medications on File Prior to Visit  Medication Sig Dispense Refill  . Ascorbic Acid (VITAMIN C) 1000 MG tablet Take 1,000 mg by mouth daily.    . Cholecalciferol (VITAMIN D) 2000 units tablet Take 2,000 Units by mouth daily. When she remembers    . Famotidine (PEPCID AC PO) Take by mouth. As needed    .  Garlic (GARLIQUE) 025 MG TBEC Take by mouth. 2-3 times per week    . hydrochlorothiazide (HYDRODIURIL) 12.5 MG tablet Take 1 tablet (12.5 mg total) by mouth daily. 90 tablet 1  . lisinopril (ZESTRIL) 10 MG tablet Take 1 tablet (10 mg total) by mouth daily. 90 tablet 1  . loratadine (CLARITIN) 10 MG tablet Take 10 mg by mouth daily as needed for allergies.    . Melatonin 10 MG TABS Take 10 mg by mouth daily as needed (sleep).    . meloxicam (MOBIC) 7.5 MG tablet TAKE 1 TABLET BY MOUTH EVERY DAY AS NEEDED FOR PAIN (Patient taking differently: Take 7.5 mg by mouth daily as needed for pain. ) 30 tablet 5  . pantoprazole (PROTONIX) 40 MG tablet Take 1 tablet (40 mg total) by mouth daily. 30 minutes before breakfast  30 tablet 3  . TURMERIC PO Take 2.5 mLs by mouth daily. Liquid  1-2 times per week     No current facility-administered medications on file prior to visit.    Allergies  Allergen Reactions  . Verapamil Swelling    Social History   Substance and Sexual Activity  Alcohol Use Yes  . Alcohol/week: 2.0 standard drinks  . Types: 2 Glasses of wine per week   Comment: occ wine    Social History   Tobacco Use  Smoking Status Never Smoker  Smokeless Tobacco Never Used    Review of Systems  Constitutional: Positive for malaise/fatigue.  HENT: Negative.   Eyes: Positive for pain.  Respiratory: Positive for shortness of breath and wheezing.   Cardiovascular: Negative.   Gastrointestinal: Positive for abdominal pain and heartburn.  Genitourinary: Positive for frequency.  Musculoskeletal: Positive for back pain.  Skin: Negative.   Neurological: Positive for sensory change.  Endo/Heme/Allergies: Negative.   Psychiatric/Behavioral: Negative.     Objective   Vitals:   05/12/20 0856  BP: 134/84  Pulse: 73  Resp: 16  Temp: 98.5 F (36.9 C)  SpO2: 95%    Physical Exam Vitals reviewed.  Constitutional:      Appearance: Normal appearance. She is not ill-appearing.  HENT:     Head: Normocephalic and atraumatic.  Eyes:     General: No scleral icterus. Cardiovascular:     Rate and Rhythm: Normal rate and regular rhythm.     Heart sounds: Normal heart sounds. No murmur heard.  No friction rub. No gallop.   Pulmonary:     Effort: Pulmonary effort is normal. No respiratory distress.     Breath sounds: Normal breath sounds. No stridor. No wheezing, rhonchi or rales.  Abdominal:     General: There is no distension.     Palpations: Abdomen is soft. There is no mass.     Tenderness: There is no abdominal tenderness. There is no guarding or rebound.     Hernia: No hernia is present.  Skin:    General: Skin is warm and dry.  Neurological:     Mental Status: She is alert and  oriented to person, place, and time.   Ultrasound report reviewed.  Single gallstone present. LFTs done in September 2021 within normal limits  Assessment  Gallstone, currently asymptomatic.  Patient does not have typical symptoms of biliary colic. Plan   Patient is dealing with her bladder cancer and does not want surgery unless absolutely necessary.  I feel she does not need a cholecystectomy at this time.  Patient understands and agrees.  The signs and symptoms of biliary colic were explained  to the patient.  Should she become more symptomatic, laparoscopic cholecystectomy would be indicated.  Follow-up as needed.  Literature was given.

## 2020-05-12 NOTE — Patient Instructions (Signed)

## 2020-05-18 ENCOUNTER — Ambulatory Visit (INDEPENDENT_AMBULATORY_CARE_PROVIDER_SITE_OTHER): Payer: Medicare HMO

## 2020-05-18 ENCOUNTER — Other Ambulatory Visit: Payer: Self-pay

## 2020-05-18 DIAGNOSIS — C672 Malignant neoplasm of lateral wall of bladder: Secondary | ICD-10-CM

## 2020-05-18 LAB — URINALYSIS, ROUTINE W REFLEX MICROSCOPIC
Bilirubin, UA: NEGATIVE
Glucose, UA: NEGATIVE
Ketones, UA: NEGATIVE
Leukocytes,UA: NEGATIVE
Nitrite, UA: NEGATIVE
Protein,UA: NEGATIVE
RBC, UA: NEGATIVE
Specific Gravity, UA: 1.025 (ref 1.005–1.030)
Urobilinogen, Ur: 0.2 mg/dL (ref 0.2–1.0)
pH, UA: 6 (ref 5.0–7.5)

## 2020-05-18 MED ORDER — BCG LIVE 50 MG IS SUSR
3.2400 mL | Freq: Once | INTRAVESICAL | Status: AC
  Administered 2020-05-18: 81 mg via INTRAVESICAL

## 2020-05-18 NOTE — Progress Notes (Signed)
BCG Bladder Instillation  BCG # 1 of 3  Due to Bladder Cancer patient is present today for a BCG treatment. Patient was cleaned and prepped in a sterile fashion with betadine. A 14FR catheter was inserted, urine return was noted 79ml, urine was yellow in color.  43ml of reconstituted BCG was instilled into the bladder. The catheter was then removed. Patient tolerated well, no complications were noted  Preformed by: Valentina Lucks, LPN  Follow up/ Additional notes: 1 wk

## 2020-05-25 ENCOUNTER — Other Ambulatory Visit: Payer: Self-pay

## 2020-05-25 ENCOUNTER — Ambulatory Visit (INDEPENDENT_AMBULATORY_CARE_PROVIDER_SITE_OTHER): Payer: Medicare HMO

## 2020-05-25 DIAGNOSIS — C672 Malignant neoplasm of lateral wall of bladder: Secondary | ICD-10-CM | POA: Diagnosis not present

## 2020-05-25 LAB — URINALYSIS, ROUTINE W REFLEX MICROSCOPIC
Bilirubin, UA: NEGATIVE
Glucose, UA: NEGATIVE
Ketones, UA: NEGATIVE
Leukocytes,UA: NEGATIVE
Nitrite, UA: NEGATIVE
RBC, UA: NEGATIVE
Specific Gravity, UA: 1.02 (ref 1.005–1.030)
Urobilinogen, Ur: 0.2 mg/dL (ref 0.2–1.0)
pH, UA: 7 (ref 5.0–7.5)

## 2020-05-25 MED ORDER — BCG LIVE 50 MG IS SUSR
3.2400 mL | Freq: Once | INTRAVESICAL | Status: AC
  Administered 2020-05-25: 81 mg via INTRAVESICAL

## 2020-05-25 NOTE — Progress Notes (Signed)
BCG Bladder Instillation  BCG # 2 of 3  Due to Bladder Cancer patient is present today for a BCG treatment. Patient was cleaned and prepped in a sterile fashion with betadine. A 14FR catheter was inserted, urine return was noted 26ml, urine was yellow in color.  81ml of reconstituted BCG was instilled into the bladder. The catheter was then removed. Patient tolerated well, no complications were noted  Preformed by: Antionette Char, Klint Lezcano,LPN  Follow up/ Additional notes: 1 wk

## 2020-05-25 NOTE — Addendum Note (Signed)
Addended by: Valentina Lucks on: 05/25/2020 09:44 AM   Modules accepted: Orders

## 2020-06-01 ENCOUNTER — Ambulatory Visit: Payer: Medicare HMO

## 2020-06-01 ENCOUNTER — Other Ambulatory Visit: Payer: Self-pay

## 2020-06-01 ENCOUNTER — Other Ambulatory Visit (INDEPENDENT_AMBULATORY_CARE_PROVIDER_SITE_OTHER): Payer: Medicare HMO

## 2020-06-01 DIAGNOSIS — C672 Malignant neoplasm of lateral wall of bladder: Secondary | ICD-10-CM

## 2020-06-01 LAB — URINALYSIS, ROUTINE W REFLEX MICROSCOPIC
Bilirubin, UA: NEGATIVE
Glucose, UA: NEGATIVE
Ketones, UA: NEGATIVE
Leukocytes,UA: NEGATIVE
Nitrite, UA: NEGATIVE
Protein,UA: NEGATIVE
RBC, UA: NEGATIVE
Specific Gravity, UA: 1.025 (ref 1.005–1.030)
Urobilinogen, Ur: 0.2 mg/dL (ref 0.2–1.0)
pH, UA: 6.5 (ref 5.0–7.5)

## 2020-06-01 MED ORDER — BCG LIVE 50 MG IS SUSR
3.2400 mL | Freq: Once | INTRAVESICAL | Status: AC
  Administered 2020-06-01: 81 mg via INTRAVESICAL

## 2020-06-01 NOTE — Progress Notes (Signed)
BCG Bladder Instillation  BCG # 3 of 3  Due to Bladder Cancer patient is present today for a BCG treatment. Patient was cleaned and prepped in a sterile fashion with betadine. A 14FR catheter was inserted, urine return was noted 31ml, urine was yellow in color.  93ml of reconstituted BCG was instilled into the bladder. The catheter was then removed. Patient tolerated well, no complications were noted  Preformed by: Asante Blanda,LPN  Follow up 3 months

## 2020-06-01 NOTE — Progress Notes (Signed)
BCG Bladder Instillation  BCG # 3 of3  Due to Bladder Cancer patient is present today for a BCG treatment. Patient was cleaned and prepped.  A 14FR catheter was inserted, urine return was noted 39ml, urine was yellow in color.  75ml of reconstituted BCG was instilled into the bladder. The catheter was then removed. Patient tolerated well, no complications were noted  Preformed by: Ellwood Steidle,LPN

## 2020-07-12 ENCOUNTER — Other Ambulatory Visit: Payer: Self-pay | Admitting: Gastroenterology

## 2020-07-26 ENCOUNTER — Encounter: Payer: Self-pay | Admitting: Gastroenterology

## 2020-07-26 ENCOUNTER — Ambulatory Visit: Payer: Medicare HMO | Admitting: Gastroenterology

## 2020-07-26 ENCOUNTER — Other Ambulatory Visit: Payer: Self-pay

## 2020-07-26 VITALS — BP 118/82 | HR 80 | Temp 97.0°F | Ht 67.0 in | Wt 193.2 lb

## 2020-07-26 DIAGNOSIS — R140 Abdominal distension (gaseous): Secondary | ICD-10-CM

## 2020-07-26 DIAGNOSIS — K219 Gastro-esophageal reflux disease without esophagitis: Secondary | ICD-10-CM | POA: Insufficient documentation

## 2020-07-26 NOTE — Progress Notes (Signed)
Referring Provider: Erven Colla, DO Primary Care Physician:  Erven Colla, DO Primary GI: Dr. Gala Romney   Chief Complaint  Patient presents with  . change in bowel    Reports stool is becoming more normal now since changing diet and taking fiber  . Gastroesophageal Reflux    Stopped pantoprazole as it did not help. Pepcid AC works better    HPI:   Kayla [name] is a 74 y.o. female presenting today with a history of change in bowel habits and chronic GERD, last seen in Sept 2021 and dealing with constipation. Her colonoscopy is recently on file as of May 2021 with next surveillance in 2024. She also noted early satiety and nausea in setting of uncontrolled GERD at last visit; Protonix was started empirically.   Feels bloated after eating at times. Pepcid AC works well, taking as needed. Protonix didn't help. No nausea. No abdominal pain. No dysphagia. Probiotic prn, which helps. Doughnuts caused abdominal pain. Known cholelithiasis. Staying away from greasy, fried foods. She has seen Dr. Arnoldo Morale and managing conservatively right now.     Past Medical History:  Diagnosis Date  . Arthritis   . Asthma    excercise induced  . Benign brain tumor ([place]) 2002   Treated at [facility] with gamma knife  . Chest discomfort   . GERD (gastroesophageal reflux disease)   . Hypertension   . Impaired fasting glucose   . Irritable bowel syndrome   . Osteopenia   . Tinnitus   . Venous insufficiency     Past Surgical History:  Procedure Laterality Date  . ABDOMINAL HYSTERECTOMY    . APPENDECTOMY    . BREAST BIOPSY Right   . CARPAL TUNNEL RELEASE     Bilateral  . COLONOSCOPY N/A 09/15/2014   Dr. Gala Romney: Multiple colonic polyps removed, tubular adenomas.  Next colonoscopy in 5 years  . COLONOSCOPY N/A 12/25/2019   five semi-pedunculated polyps in descending colon and IC valve, 3-7 mm in size. Tubular adenomas. 3 year surveillance.   . CYSTOSCOPY W/ URETERAL STENT PLACEMENT Bilateral  05/05/2018   Procedure: CYSTOSCOPY WITH BILATERAL RETROGRADE PYELOGRAM/ RIGHT URETERAL STENT PLACEMENT;  Surgeon: Cleon Gustin, MD;  Location: [facility] OR[facility];  Service: Urology;  Laterality: Bilateral;  . LUMBAR DISC SURGERY    . PARTIAL HYSTERECTOMY    . POLYPECTOMY  12/25/2019   Procedure: POLYPECTOMY;  Surgeon: Daneil Dolin, MD;  Location: [facility] SU[facility];  Service: Endoscopy;;  ileocecal valve polyp; descending x2;  . TRANSURETHRAL RESECTION OF BLADDER TUMOR N/A 05/05/2018   Procedure: TRANSURETHRAL RESECTION OF BLADDER TUMOR (TURBT);  Surgeon: Cleon Gustin, MD;  Location: [facility] OR[facility];  Service: Urology;  Laterality: N/A;  . TRANSURETHRAL RESECTION OF BLADDER TUMOR N/A 06/16/2018   Procedure: TRANSURETHRAL RESECTION OF BLADDER TUMOR (TURBT);  Surgeon: Cleon Gustin, MD;  Location: [facility] OR[facility];  Service: Urology;  Laterality: N/A;    Current Outpatient Medications  Medication Sig Dispense Refill  . Ascorbic Acid (VITAMIN C) 1000 MG tablet Take 1,000 mg by mouth daily.    . Cholecalciferol (VITAMIN D) 2000 units tablet Take 2,000 Units by mouth daily. When she remembers    . Famotidine (PEPCID AC PO) Take by mouth. As needed    . Garlic 629 MG TBEC Take by mouth. 2-3 times per week    . hydrochlorothiazide (HYDRODIURIL) 12.5 MG tablet Take 1 tablet (12.5 mg total) by mouth daily. 90 tablet 1  . lisinopril (ZESTRIL) 10 MG tablet Take  1 tablet (10 mg total) by mouth daily. 90 tablet 1  . loratadine (CLARITIN) 10 MG tablet Take 10 mg by mouth daily as needed for allergies.    . Melatonin 10 MG TABS Take 10 mg by mouth daily as needed (sleep).    . meloxicam (MOBIC) 7.5 MG tablet TAKE 1 TABLET BY MOUTH EVERY DAY AS NEEDED FOR PAIN (Patient taking differently: Take 7.5 mg by mouth daily as needed for pain.) 30 tablet 5  . psyllium (METAMUCIL) 58.6 % packet Take 1 packet by mouth. 1-3 times per week    . TURMERIC PO Take 2.5 mLs by mouth daily. Liquid  1-2 times per week    . pantoprazole  (PROTONIX) 40 MG tablet TAKE 1 TABLET (40 MG TOTAL) BY MOUTH DAILY. 30 MINUTES BEFORE BREAKFAST (Patient not taking: Reported on 07/26/2020) 90 tablet 1   No current facility-administered medications for this visit.    Allergies as of 07/26/2020 - Review Complete 07/26/2020  Allergen Reaction Noted  . Verapamil Swelling 07/19/2013    Family History  Problem Relation Age of Onset  . Cancer Father        colon cancer; also brother-colon And sister-breast  . Heart disease Father   . Heart disease Mother        at advanced age  . Hypertension Mother   . Hyperlipidemia Mother   . Osteoporosis Mother   . Cancer Brother        liver and colon  . Breast cancer Sister   . Cancer Sister 27       breast  . Breast cancer Sister   . Breast cancer Paternal Aunt     Social History   Socioeconomic History  . Marital status: Married    Spouse name: Not on file  . Number of children: 2  . Years of education: Not on file  . Highest education level: Not on file  Occupational History  . 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 Smoking status: Never Smoker  . Smokeless tobacco: Never Used  Substance and Sexual Activity  . Alcohol use: Yes    Alcohol/week: 2.0 standard drinks    Types: 2 Glasses of wine per week    Comment: occ wine  . Drug use: No  . Sexual activity: Not Currently    Birth control/protection: Post-menopausal, Surgical  Other Topics Concern  . Not on file  Social History Narrative   Exercises regularly   Social Determinants of Health   Financial Resource Strain: Not on file  Food Insecurity: Not on file  Transportation Needs: Not on file  Physical Activity: Not on file  Stress: Not on file  Social Connections: Not on file    Review of Systems: Gen: Denies fever, chills, anorexia. Denies fatigue, weakness, weight loss.  CV: Denies chest pain, palpitations, syncope, peripheral edema, and claudication. Resp: Denies  dyspnea at rest, cough, wheezing, coughing up blood, and pleurisy. GI: see HPI Derm: Denies rash, itching, dry skin Psych: Denies depression, anxiety, memory loss, confusion. No homicidal or suicidal ideation.  Heme: Denies bruising, bleeding, and enlarged lymph nodes.  Physical Exam: BP 118/82   Pulse 80   Temp (!) 97 F (36.1 C)   Ht 5\' 7"  (1.702 m)   Wt 193 lb 3.2 oz (87.6 kg)   BMI 30.26 kg/m  General:   Alert and oriented. No distress noted. Pleasant and cooperative.  Head:  Normocephalic and atraumatic. Eyes:  Conjuctiva  clear without scleral icterus. Mouth:  Mask in place Abdomen:  +BS, soft, non-tender and non-distended. No rebound or guarding. No HSM or masses noted. Msk:  Symmetrical without gross deformities. Normal posture. Extremities:  Without edema. Neurologic:  Alert and  oriented x4 Psych:  Alert and cooperative. Normal mood and affect.  ASSESSMENT: Kayla [name] is a 74 y.o. female presenting today with constipation that has responded well to supplemental fiber, chronic bloating, and GERD.  Chronic bloating continues despite better bowel regimen. Denying dysphagia, abdominal pain, lack of appetite, or loss of weight. Known cholelithiasis but asymptomatic. Will check celiac serologies to be thorough, as she does note worsening symptoms with grains. Probiotic has also been helpful in the past, so we will resume this daily instead of prn.  GERD: takes pepcid prn. Protonix without help and feels pepcid has been more beneficial.      PLAN:  Celiac serologies Continue pepcid Colonoscopy 2024 Return in July 2022  Gelene Mink, PhD, ANP-BC [facility]

## 2020-07-26 NOTE — Patient Instructions (Signed)
Please have blood work done when you are able. We will call with results.  You may take the Probiotic daily.  Further recommendations to follow!  Have a wonderful New Year!   I enjoyed seeing you again today! As you know, I value our relationship and want to provide genuine, compassionate, and quality care. I welcome your feedback. If you receive a survey regarding your visit,  I greatly appreciate you taking time to fill this out. See you next time!  Gelene Mink, PhD, ANP-BC [facility]

## 2020-07-27 DIAGNOSIS — R140 Abdominal distension (gaseous): Secondary | ICD-10-CM | POA: Diagnosis not present

## 2020-07-28 LAB — IGA: Immunoglobulin A: 77 mg/dL (ref 70–320)

## 2020-07-28 LAB — TISSUE TRANSGLUTAMINASE, IGA: (tTG) Ab, IgA: <1 U/mL

## 2020-07-30 ENCOUNTER — Other Ambulatory Visit: Payer: Self-pay

## 2020-07-30 ENCOUNTER — Emergency Department (HOSPITAL_COMMUNITY)
Admission: EM | Admit: 2020-07-30 | Discharge: 2020-07-30 | Disposition: A | Discharge status: Home / Self Care | Payer: Medicare HMO | Attending: Emergency Medicine | Admitting: Emergency Medicine

## 2020-07-30 ENCOUNTER — Emergency Department (HOSPITAL_COMMUNITY): Payer: Medicare HMO

## 2020-07-30 ENCOUNTER — Encounter (HOSPITAL_COMMUNITY): Payer: Self-pay | Admitting: Emergency Medicine

## 2020-07-30 DIAGNOSIS — R0981 Nasal congestion: Secondary | ICD-10-CM | POA: Diagnosis not present

## 2020-07-30 DIAGNOSIS — R519 Headache, unspecified: Secondary | ICD-10-CM | POA: Diagnosis not present

## 2020-07-30 DIAGNOSIS — J45909 Unspecified asthma, uncomplicated: Secondary | ICD-10-CM | POA: Diagnosis not present

## 2020-07-30 DIAGNOSIS — I10 Essential (primary) hypertension: Secondary | ICD-10-CM | POA: Insufficient documentation

## 2020-07-30 DIAGNOSIS — M542 Cervicalgia: Secondary | ICD-10-CM | POA: Diagnosis not present

## 2020-07-30 MED ORDER — FLUTICASONE PROPIONATE 50 MCG/ACT NA SUSP: 2.0000 | Freq: Every day | NASAL | 2 refills | Status: DC

## 2020-07-30 NOTE — ED Provider Notes (Signed)
[facility] Emergency Department Provider Note MRN:  [id number]  Arrival date & time: 07/30/20     Chief Complaint   Nasal Congestion   History of Present Illness   Kayla [name] is a 75 y.o. year-old female with a history of hypertension, GERD, benign brain tumor presenting to the ED with chief complaint of nasal congestion.  1 month of bandlike headache, bilateral neck pain that is worse with motion.  Denies trauma or fall to the head or neck.  Denies numbness or weakness to the arms or legs.  Also endorsing nasal congestion for the past month or 2.  Denies fever or cough, no other complaints.  Pain is mild to moderate.  Review of Systems  A complete 10 system review of systems was obtained and all systems are negative except as noted in the HPI and PMH.   Patient's Health History    Past Medical History:  Diagnosis Date  . Arthritis   . Asthma    excercise induced  . Benign brain tumor ([place]) 2002   Treated at [facility] with gamma knife  . Chest discomfort   . GERD (gastroesophageal reflux disease)   . Hypertension   . Impaired fasting glucose   . Irritable bowel syndrome   . Osteopenia   . Tinnitus   . Venous insufficiency     Past Surgical History:  Procedure Laterality Date  . ABDOMINAL HYSTERECTOMY    . APPENDECTOMY    . BREAST BIOPSY Right   . CARPAL TUNNEL RELEASE     Bilateral  . COLONOSCOPY N/A 09/15/2014   Dr. Gala Romney: Multiple colonic polyps removed, tubular adenomas.  Next colonoscopy in 5 years  . COLONOSCOPY N/A 12/25/2019   five semi-pedunculated polyps in descending colon and IC valve, 3-7 mm in size. Tubular adenomas. 3 year surveillance.   . CYSTOSCOPY W/ URETERAL STENT PLACEMENT Bilateral 05/05/2018   Procedure: CYSTOSCOPY WITH BILATERAL RETROGRADE PYELOGRAM/ RIGHT URETERAL STENT PLACEMENT;  Surgeon: Cleon Gustin, MD;  Location: [facility];  Service: Urology;  Laterality: Bilateral;  . LUMBAR DISC SURGERY    . PARTIAL  HYSTERECTOMY    . POLYPECTOMY  12/25/2019   Procedure: POLYPECTOMY;  Surgeon: Daneil Dolin, MD;  Location: [facility];  Service: Endoscopy;;  ileocecal valve polyp; descending x2;  . TRANSURETHRAL RESECTION OF BLADDER TUMOR N/A 05/05/2018   Procedure: TRANSURETHRAL RESECTION OF BLADDER TUMOR (TURBT);  Surgeon: Cleon Gustin, MD;  Location: [facility];  Service: Urology;  Laterality: N/A;  . TRANSURETHRAL RESECTION OF BLADDER TUMOR N/A 06/16/2018   Procedure: TRANSURETHRAL RESECTION OF BLADDER TUMOR (TURBT);  Surgeon: Cleon Gustin, MD;  Location: [facility];  Service: Urology;  Laterality: N/A;    Family History  Problem Relation Age of Onset  . Cancer Father        colon cancer; also brother-colon And sister-breast  . Heart disease Father   . Heart disease Mother        at advanced age  . Hypertension Mother   . Hyperlipidemia Mother   . Osteoporosis Mother   . Cancer Brother        liver and colon  . Breast cancer Sister   . Cancer Sister 54       breast  . Breast cancer Sister   . Breast cancer Paternal Aunt     Social History   Socioeconomic History  . Marital status: Married    Spouse name: Not on file  . Number of children: 2  .  Years of education: Not on file  . Highest education level: Not on file  Occupational History  . 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 Smoking status: Never Smoker  . Smokeless tobacco: Never Used  Vaping Use  . Vaping Use: Never used  Substance and Sexual Activity  . Alcohol use: Yes    Alcohol/week: 2.0 standard drinks    Types: 2 Glasses of wine per week    Comment: occ wine  . Drug use: No  . Sexual activity: Not Currently    Birth control/protection: Post-menopausal, Surgical  Other Topics Concern  . Not on file  Social History Narrative   Exercises regularly   Social Determinants of Health   Financial Resource Strain: Not on file  Food Insecurity: Not on file  Transportation  Needs: Not on file  Physical Activity: Not on file  Stress: Not on file  Social Connections: Not on file  Intimate Partner Violence: Not on file     Physical Exam   Vitals:   07/30/20 1259  BP: (!) 139/97  Pulse: 71  Resp: 16  Temp: 98.3 F (36.8 C)  SpO2: 95%    CONSTITUTIONAL: Well-appearing, NAD NEURO:  Alert and oriented x 3, no focal deficits EYES:  eyes equal and reactive ENT/NECK:  no LAD, no JVD CARDIO: Regular rate, well-perfused, normal S1 and S2 PULM:  CTAB no wheezing or rhonchi GI/GU:  normal bowel sounds, non-distended, non-tender MSK/SPINE:  No gross deformities, no edema SKIN:  no rash, atraumatic PSYCH:  Appropriate speech and behavior  *Additional and/or pertinent findings included in MDM below  Diagnostic and Interventional Summary    EKG Interpretation  Date/Time:    Ventricular Rate:    PR Interval:    QRS Duration:   QT Interval:    QTC Calculation:   R Axis:     Text Interpretation:        Labs Reviewed - No data to display  CT HEAD WO CONTRAST  Final Result    CT CERVICAL SPINE WO CONTRAST  Final Result      Medications - No data to display   Procedures  /  Critical Care Procedures  ED Course and Medical Decision Making  I have reviewed the triage vital signs, the nursing notes, and pertinent available records from the EMR.  Listed above are laboratory and imaging tests that I personally ordered, reviewed, and interpreted and then considered in my medical decision making (see below for details).  Given the new headache and patient's age will obtain screening CT head and neck.  More consistent with tension headache, patient admits to not having a lot of sleep or water recently.  Sinus pressure related headache also possible.     CT imaging is reassuring, appropriate for discharge.  Barth Kirks. Sedonia Small, [place] [email or web address]  Final Clinical Impressions(s) / ED  Diagnoses     ICD-10-CM   1. Nonintractable headache, unspecified chronicity pattern, unspecified headache type  R51.9     ED Discharge Orders         Ordered    fluticasone (FLONASE) 50 MCG/ACT nasal spray  Daily        07/30/20 1647           Discharge Instructions Discussed with and Provided to Patient:     Discharge Instructions     You were evaluated in the Emergency Department and after careful evaluation, we did  not find any emergent condition requiring admission or further testing in the hospital.  Your exam/testing today was overall reassuring.  Your CT scans did not show any significant abnormalities.  We suspect your symptoms are related to either a tension headache or sinus pressure.  We recommend more sleep, more water, and you can use the Flonase nasal spray as directed.  Please talk to your primary care doctor about your symptoms.  Please return to the Emergency Department if you experience any worsening of your condition.  Thank you for allowing [place] to be a part of your care.        Sabas Sous, MD 07/30/20 [phone]

## 2020-07-30 NOTE — Discharge Instructions (Addendum)
You were evaluated in the Emergency Department and after careful evaluation, we did not find any emergent condition requiring admission or further testing in the hospital.  Your exam/testing today was overall reassuring.  Your CT scans did not show any significant abnormalities.  We suspect your symptoms are related to either a tension headache or sinus pressure.  We recommend more sleep, more water, and you can use the Flonase nasal spray as directed.  Please talk to your primary care doctor about your symptoms.  Please return to the Emergency Department if you experience any worsening of your condition.  Thank you for allowing [place] to be a part of your care.

## 2020-07-30 NOTE — ED Triage Notes (Signed)
Pt c/o nasal congestion with HA for the past month.

## 2020-08-17 ENCOUNTER — Ambulatory Visit (INDEPENDENT_AMBULATORY_CARE_PROVIDER_SITE_OTHER): Payer: Medicare HMO | Admitting: Urology

## 2020-08-17 ENCOUNTER — Other Ambulatory Visit: Payer: Self-pay

## 2020-08-17 ENCOUNTER — Other Ambulatory Visit: Payer: Medicare HMO | Admitting: Urology

## 2020-08-17 ENCOUNTER — Encounter: Payer: Self-pay | Admitting: Urology

## 2020-08-17 VITALS — BP 152/86 | HR 101 | Temp 98.4°F | Ht 67.0 in | Wt 193.0 lb

## 2020-08-17 DIAGNOSIS — C672 Malignant neoplasm of lateral wall of bladder: Secondary | ICD-10-CM | POA: Diagnosis not present

## 2020-08-17 LAB — URINALYSIS, ROUTINE W REFLEX MICROSCOPIC
Bilirubin, UA: NEGATIVE
Glucose, UA: NEGATIVE
Ketones, UA: NEGATIVE
Leukocytes,UA: NEGATIVE
Nitrite, UA: NEGATIVE
Protein,UA: NEGATIVE
RBC, UA: NEGATIVE
Specific Gravity, UA: 1.02 (ref 1.005–1.030)
Urobilinogen, Ur: 0.2 mg/dL (ref 0.2–1.0)
pH, UA: 6.5 (ref 5.0–7.5)

## 2020-08-17 MED ORDER — CIPROFLOXACIN HCL 500 MG PO TABS
500.0000 mg | ORAL_TABLET | Freq: Once | ORAL | Status: AC
  Administered 2020-08-17: 500 mg via ORAL

## 2020-08-17 NOTE — Progress Notes (Signed)
Urological Symptom Review ° °Patient is experiencing the following symptoms: °Frequent urination °Get up at night to urinate ° ° °Review of Systems ° °Gastrointestinal (upper)  : °Indigestion/heartburn ° °Gastrointestinal (lower) : °Negative for lower GI symptoms ° °Constitutional : °Negative for symptoms ° °Skin: °Negative for skin symptoms ° °Eyes: °Negative for eye symptoms ° °Ear/Nose/Throat : °Negative for Ear/Nose/Throat symptoms ° °Hematologic/Lymphatic: °Negative for Hematologic/Lymphatic symptoms ° °Cardiovascular : °Negative for cardiovascular symptoms ° °Respiratory : °Negative for respiratory symptoms ° °Endocrine: °Negative for endocrine symptoms ° °Musculoskeletal: °Negative for musculoskeletal symptoms ° °Neurological: °Negative for neurological symptoms ° °Psychologic: °Negative for psychiatric symptoms °

## 2020-08-17 NOTE — Progress Notes (Signed)
   08/17/20  CC: hx of bladder cancer  HPI: Kayla [name] is a 75yo here for followup for bladder cancer. She finished 19month maintenance BCG end of OCt 2021. No worsening LUTS. Bladder cancer diagnosed in 05/2018 Blood pressure (!) 152/86, pulse (!) 101, temperature 98.4 F (36.9 C), height 5\' 7"  (1.702 m), weight 193 lb (87.5 kg). NED. A&Ox3.   No respiratory distress   Abd soft, NT, ND Normal external genitalia with patent urethral meatus  Cystoscopy Procedure Note  Patient identification was confirmed, informed consent was obtained, and patient was prepped using Betadine solution.  Lidocaine jelly was administered per urethral meatus.    Procedure: - Flexible cystoscope introduced, without any difficulty.   - Thorough search of the bladder revealed:    normal urethral meatus    normal urothelium    no stones    no ulcers     no tumors    no urethral polyps    no trabeculation  - Ureteral orifices were normal in position and appearance.  Post-Procedure: - Patient tolerated the procedure well  Assessment/ Plan: 6 month maintenance BCG in 2 weeks. RTC 6 months for cystoscopy Nicolette Bang, MD

## 2020-08-17 NOTE — Patient Instructions (Signed)
Bladder Cancer  Bladder cancer is a condition in which abnormal tissue (a tumor) grows in the bladder. The bladder is the organ that holds urine. Two tubes (ureters) carry the urine from the kidneys to the bladder. The bladder wall is made of layers of tissue. Cancer that spreads through these layers of the bladder wall becomes more difficult to treat. What are the causes? The cause of this condition is not known. What increases the risk? The following factors may make you more likely to develop this condition:  Smoking.  Working where there are risks (occupational exposures), such as working with rubber, leather, clothing fabric, dyes, chemicals, and paint.  Being 55 years of age or older.  Being female.  Having bladder inflammation that is long-term (chronic).  Having a history of cancer, including: ? A family history of bladder cancer. ? Personal experience with bladder cancer. ? Having had certain treatments for cancer before. These include:  Medicines to kill cancer cells (chemotherapy).  Strong X-ray beams or capsules high in energy to kill cancer cells and shrink tumors (radiation therapy).  Having been exposed to arsenic. This is a chemical element that can poison you. What are the signs or symptoms? Early symptoms of this condition include:  Seeing blood in your urine.  Feeling pain when urinating.  Having infections of your urinary system (urinary tract infections or UTIs) that happen often.  Having to urinate sooner or more often than usual. Later symptoms of this condition include:  Not being able to urinate.  Pain on one side of your lower back.  Loss of appetite.  Weight loss.  Tiredness (fatigue).  Swelling in your feet.  Bone pain. How is this diagnosed? This condition is diagnosed based on:  Your medical history.  A physical exam.  Lab tests, such as urine tests.  Imaging tests.  Your symptoms. You may also have other tests or  procedures done, such as:  A cystoscopy. A narrow tube is inserted into your bladder through the organ that connects your bladder to the outside of your body (urethra). This is done to view the lining of your bladder for tumors.  A biopsy. This procedure involves removing a tissue sample to look at it under a microscope to see if cancer is present. It is important to find out:  How deeply into the bladder wall cancer has grown.  Whether cancer has spread to any other parts of your body. This may require blood tests or imaging tests, such as a CT scan, MRI, bone scan, or X-rays. How is this treated? Your health care provider may recommend one or more types of treatment based on the stage of your cancer. The most common types of treatment are:  Surgery to remove the cancer. Procedures that may be done include: ? Removing a tumor on the inside wall of the bladder (transurethral resection). ? Removing the bladder (cystectomy).  Radiation therapy. This is often used together with chemotherapy.  Chemotherapy.  Immunotherapy. This uses medicines to help your immune system destroy cancer cells. Follow these instructions at home:  Take over-the-counter and prescription medicines only as told by your health care provider.  Eat a healthy diet. Some of your treatments might affect your appetite.  Do not use any products that contain nicotine or tobacco, such as cigarettes, e-cigarettes, and chewing tobacco. If you need help quitting, ask your health care provider.  Consider joining a support group. This may help you learn to cope with the stress of having   bladder cancer.  Tell your cancer care team if you develop side effects. Your team may be able to recommend ways to get relief.  Keep all follow-up visits as told by your health care provider. This is important. Where to find more information  American Cancer Society: www.cancer.org  National Cancer Institute (NCI):  www.cancer.gov Contact a health care provider if:  You have symptoms of a urinary tract infection. These include: ? Fever. ? Chills. ? Weakness. ? Muscle aches. ? Pain in your abdomen. ? Urge to urinate that is stronger and happens more often than usual. ? Burning feeling in the bladder or urethra when you urinate. Get help right away if:  There is blood in your urine.  You cannot urinate.  You have severe pain or other symptoms that do not go away. Summary  Bladder cancer is a condition in which tumors grow in the bladder and cause illness.  This condition is diagnosed based on your medical history, a physical exam, lab tests, imaging tests, and your symptoms.  Your health care provider may recommend one or more types of treatment based on the stage of your cancer.  Consider joining a support group. This may help you learn to cope with the stress of having bladder cancer. This information is not intended to replace advice given to you by your health care provider. Make sure you discuss any questions you have with your health care provider. Document Revised: 03/25/2019 Document Reviewed: 03/25/2019 Elsevier Patient Education  2021 Elsevier Inc.  

## 2020-09-01 ENCOUNTER — Ambulatory Visit (INDEPENDENT_AMBULATORY_CARE_PROVIDER_SITE_OTHER): Payer: Medicare HMO

## 2020-09-01 ENCOUNTER — Other Ambulatory Visit: Payer: Self-pay

## 2020-09-01 DIAGNOSIS — C672 Malignant neoplasm of lateral wall of bladder: Secondary | ICD-10-CM

## 2020-09-01 LAB — URINALYSIS, ROUTINE W REFLEX MICROSCOPIC
Bilirubin, UA: NEGATIVE
Glucose, UA: NEGATIVE
Ketones, UA: NEGATIVE
Leukocytes,UA: NEGATIVE
Nitrite, UA: NEGATIVE
Protein,UA: NEGATIVE
RBC, UA: NEGATIVE
Specific Gravity, UA: 1.015 (ref 1.005–1.030)
Urobilinogen, Ur: 0.2 mg/dL (ref 0.2–1.0)
pH, UA: 6 (ref 5.0–7.5)

## 2020-09-01 MED ORDER — BCG LIVE 50 MG IS SUSR
3.2400 mL | Freq: Once | INTRAVESICAL | Status: AC
  Administered 2020-09-01: 81 mg via INTRAVESICAL

## 2020-09-01 NOTE — Patient Instructions (Signed)

## 2020-09-01 NOTE — Progress Notes (Signed)
BCG Bladder Instillation  BCG # 1 of 6  Due to Bladder Cancer patient is present today for a BCG treatment. Patient was cleaned and prepped in a sterile fashion with betadine. A 14FR catheter was inserted, urine return was noted 60ml, urine was yellow in color.  61ml of reconstituted BCG was instilled into the bladder. The catheter was then removed. Patient tolerated well, no complications were noted  Performed by: Harlea Goetzinger, lpn  Follow up/ Additional notes: Keep next scheduled NV.

## 2020-09-08 ENCOUNTER — Ambulatory Visit (INDEPENDENT_AMBULATORY_CARE_PROVIDER_SITE_OTHER): Payer: Medicare HMO

## 2020-09-08 ENCOUNTER — Other Ambulatory Visit: Payer: Self-pay

## 2020-09-08 DIAGNOSIS — C672 Malignant neoplasm of lateral wall of bladder: Secondary | ICD-10-CM | POA: Diagnosis not present

## 2020-09-08 LAB — URINALYSIS, ROUTINE W REFLEX MICROSCOPIC
Bilirubin, UA: NEGATIVE
Glucose, UA: NEGATIVE
Ketones, UA: NEGATIVE
Nitrite, UA: NEGATIVE
Protein,UA: NEGATIVE
RBC, UA: NEGATIVE
Specific Gravity, UA: >1.03 — ABNORMAL HIGH (ref 1.005–1.030)
Urobilinogen, Ur: 0.2 mg/dL (ref 0.2–1.0)
pH, UA: 6 (ref 5.0–7.5)

## 2020-09-08 LAB — MICROSCOPIC EXAMINATION
RBC, Urine: NONE SEEN /hpf (ref 0–2)
Renal Epithel, UA: NONE SEEN /hpf

## 2020-09-08 MED ORDER — BCG LIVE 50 MG IS SUSR
3.2400 mL | Freq: Once | INTRAVESICAL | Status: AC
  Administered 2020-09-08: 81 mg via INTRAVESICAL

## 2020-09-08 NOTE — Progress Notes (Signed)
BCG Bladder Instillation  BCG # 2 of 3  Due to Bladder Cancer patient is present today for a BCG treatment. Patient was cleaned and prepped in a sterile fashion with betadine. A 14 FR catheter was inserted, urine return was noted 43ml, urine was yellow in color.  3ml of reconstituted BCG was instilled into the bladder. The catheter was then removed. Patient tolerated well, no complications were noted  Performed by: Rithy Mandley, lpn  Follow up/ Additional notes: Keep next scheduled NV

## 2020-09-15 ENCOUNTER — Ambulatory Visit (INDEPENDENT_AMBULATORY_CARE_PROVIDER_SITE_OTHER): Payer: Medicare HMO

## 2020-09-15 ENCOUNTER — Other Ambulatory Visit: Payer: Self-pay

## 2020-09-15 DIAGNOSIS — C672 Malignant neoplasm of lateral wall of bladder: Secondary | ICD-10-CM

## 2020-09-15 LAB — URINALYSIS, ROUTINE W REFLEX MICROSCOPIC
Bilirubin, UA: NEGATIVE
Glucose, UA: NEGATIVE
Ketones, UA: NEGATIVE
Leukocytes,UA: NEGATIVE
Nitrite, UA: NEGATIVE
Protein,UA: NEGATIVE
RBC, UA: NEGATIVE
Specific Gravity, UA: 1.02 (ref 1.005–1.030)
Urobilinogen, Ur: 0.2 mg/dL (ref 0.2–1.0)
pH, UA: 7 (ref 5.0–7.5)

## 2020-09-15 MED ORDER — BCG LIVE 50 MG IS SUSR
3.2400 mL | Freq: Once | INTRAVESICAL | Status: AC
  Administered 2020-09-15: 81 mg via INTRAVESICAL

## 2020-09-15 NOTE — Progress Notes (Signed)
BCG Bladder Instillation  BCG # 3 of 3  Due to Bladder Cancer patient is present today for a BCG treatment. Patient was cleaned and prepped in a sterile fashion with betadine. A 14FR catheter was inserted, urine return was noted 74ml, urine was yellow in color.  22ml of reconstituted BCG was instilled into the bladder. The catheter was then removed. Patient tolerated well, no complications were noted  Performed by: Shraga Custard, lpn  Follow up/ Additional notes: Keep next scheduled OV

## 2020-09-15 NOTE — Patient Instructions (Signed)

## 2020-09-21 DIAGNOSIS — M542 Cervicalgia: Secondary | ICD-10-CM | POA: Diagnosis not present

## 2020-09-21 DIAGNOSIS — M9901 Segmental and somatic dysfunction of cervical region: Secondary | ICD-10-CM | POA: Diagnosis not present

## 2020-09-21 DIAGNOSIS — M546 Pain in thoracic spine: Secondary | ICD-10-CM | POA: Diagnosis not present

## 2020-09-21 DIAGNOSIS — M9902 Segmental and somatic dysfunction of thoracic region: Secondary | ICD-10-CM | POA: Diagnosis not present

## 2020-09-22 ENCOUNTER — Other Ambulatory Visit: Payer: Self-pay

## 2020-09-22 ENCOUNTER — Encounter: Payer: Self-pay | Admitting: Family Medicine

## 2020-09-22 ENCOUNTER — Ambulatory Visit (INDEPENDENT_AMBULATORY_CARE_PROVIDER_SITE_OTHER): Payer: Medicare HMO | Admitting: Family Medicine

## 2020-09-22 VITALS — BP 132/80 | HR 88 | Temp 97.8°F | Ht 67.0 in | Wt 195.0 lb

## 2020-09-22 DIAGNOSIS — A6920 Lyme disease, unspecified: Secondary | ICD-10-CM

## 2020-09-22 MED ORDER — DOXYCYCLINE HYCLATE 100 MG PO TABS: 100.0000 mg | ORAL_TABLET | Freq: Two times a day (BID) | ORAL | 0 refills | Status: DC

## 2020-09-22 NOTE — Progress Notes (Signed)
Patient ID: Kayla [name], female    DOB: [birth year], 75 y.o.   MRN: [id number]   No chief complaint on file.  Subjective:  CC: tick bite  This is a new problem.  Presents today following removal of a tick.  Reports that she was working outside approximately a week ago, found a tick on Tuesday, remove the tick-- has noticed a red circular area on the left upper thigh.  Reports that she taped a potato to the area, the red circular area has decreased since that time.  Associated symptoms include nausea.  Denies fever, chills, chest pain endorses shortness of breath, which is not a new symptom.   pulled tick off left thigh 2 days ago.   Nausea started one week ago.    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   Outpatient Encounter Medications as of 09/22/2020  Medication Sig  . Ascorbic Acid (VITAMIN C) 1000 MG tablet Take 1,000 mg by mouth daily.  . Cholecalciferol (VITAMIN D) 2000 units tablet Take 2,000 Units by mouth daily. When she remembers  . doxycycline (VIBRA-TABS) 100 MG tablet Take 1 tablet (100 mg total) by mouth 2 (two) times daily.  . Famotidine (PEPCID AC PO) Take by mouth. As needed  . fluticasone (FLONASE) 50 MCG/ACT nasal spray Place 2 sprays into both nostrils daily for 5 days.  . Garlic 962 MG TBEC Take by mouth. 2-3 times per week  . hydrochlorothiazide (HYDRODIURIL) 12.5 MG tablet Take 1 tablet (12.5 mg total) by mouth daily.  Marland Kitchen lisinopril (ZESTRIL) 10 MG tablet Take 1 tablet (10 mg total) by mouth daily.  Marland Kitchen loratadine (CLARITIN) 10 MG tablet Take 10 mg by mouth daily as needed for allergies.  . Melatonin 10 MG TABS Take 10 mg by mouth daily as needed (sleep).  . meloxicam (MOBIC) 7.5 MG tablet TAKE 1 TABLET BY MOUTH EVERY DAY AS  NEEDED FOR PAIN (Patient taking differently: Take 7.5 mg by mouth daily as needed for pain.)  . psyllium (METAMUCIL) 58.6 % packet Take 1 packet by mouth. 1-3 times per week  . TURMERIC PO Take 2.5 mLs by mouth daily. Liquid  1-2 times per week  . [DISCONTINUED] pantoprazole (PROTONIX) 40 MG tablet TAKE 1 TABLET (40 MG TOTAL) BY MOUTH DAILY. [place] (Patient not taking: Reported on 09/22/2020)   No facility-administered encounter medications on file as of 09/22/2020.     Review of Systems  Constitutional: Negative for chills and fever.  HENT: Positive for congestion.   Respiratory: Positive for shortness of breath.        Not new symptoms, has exercise induced asthma.   Cardiovascular: Negative for chest pain.  Gastrointestinal: Positive for nausea. Negative for vomiting.  Skin: Positive for rash.       Left upper thigh with red circle around bite mark.      Vitals BP 132/80   Pulse 88   Temp 97.8 F (36.6 C)   Ht 5\' 7"  (1.702 m)   Wt 195 lb (88.5 kg)   SpO2 96%   BMI 30.54 kg/m   Objective:   Physical Exam Vitals reviewed.  Constitutional:      Appearance: Normal appearance.  Cardiovascular:     Rate and Rhythm: Normal rate and regular rhythm.     Heart sounds: Normal heart sounds.  Pulmonary:     Effort: Pulmonary effort is normal.     Breath sounds: Normal breath sounds.  Skin:    General: Skin is warm and dry.     Findings: Rash present.     Comments: Left upper thigh with erythematous circular rash around bite mark.   Neurological:     General: No focal deficit present.     Mental Status: She is alert.  Psychiatric:        Behavior: Behavior normal.      Assessment and Plan   1. Erythema migrans (Lyme disease) - doxycycline (VIBRA-TABS) 100 MG tablet; Take 1 tablet (100 mg total) by mouth 2 (two) times daily.  Dispense: 20 tablet; Refill: 0   Erythematous circular area on left upper thigh, with bite mark in center.Tick removed  Tuesday. Having nausea.  Will treat with antibiotic for 10 days. Will follow-up if develops  fever, muscular problems (information given at discharge to look out for). Declines need for med for nausea. Will use natural remedies.   Agrees with plan of care discussed today. Understands warning signs to seek further care: chest pain, shortness of breath, any significant change in health.  Understands to follow-up if symptoms worsen or do not improve. Needs to schedule wellness, will do that at later time. Encouraged yogurt/probiotics after completing antibiotic.     Chalmers Guest, NP 09/22/2020

## 2020-09-22 NOTE — Patient Instructions (Signed)
Lyme Disease Lyme disease is an infection that can affect many parts of the body, including the skin, joints, and nervous system. It is a bacterial infection that starts from the bite of an infected tick. Over time, the infection can worsen, and some of the symptoms are similar to the flu. If Lyme disease is not treated, it may cause joint pain, swelling, numbness, problems thinking, fatigue, muscle weakness, and other problems. What are the causes? This condition is caused by bacteria called Borrelia burgdorferi.  You can get Lyme disease by being bitten by an infected tick.  Only black-legged, or Ixodes, ticks that are infected with the bacteria can cause Lyme disease.  The tick must be attached to your skin for a certain period of time to pass along the infection. This is usually 36-48 hours.  Deer often carry infected ticks. What increases the risk? The following factors may make you more likely to develop this condition:  Living in or visiting these areas in the [place] ? [place]. ? [place]. ? [place].  Spending time in wooded or grassy areas.  Being outdoors with exposed skin.  Camping, gardening, hiking, fishing, hunting, or working outdoors.  Failing to remove a tick from your skin. What are the signs or symptoms? Symptoms of this condition may include:  Chills and fever.  Headache.  Fatigue.  General achiness.  Muscle pain.  Joint pain, often in the knees.  A round, red rash that surrounds the center of the tick bite. The center of the rash may be blood colored or have tiny blisters.  Swollen lymph glands.  Stiff neck.   How is this diagnosed? This condition is diagnosed based on:  Your symptoms and medical history.  A physical exam.  A blood test. How is this treated? The main treatment for this condition is antibiotic medicine, which is usually taken by mouth (orally).  The length of treatment depends on how soon after a  tick bite you begin taking the medicine. In some cases, treatment is necessary for several weeks.  If the infection is severe, antibiotics may need to be given through an IV that is inserted into one of your veins. Follow these instructions at home:  Take over-the-counter and prescription medicines only as told by your health care provider. Finish all antibiotic medicine, even when you start to feel better.  Ask your health care provider about taking a probiotic in between doses of your antibiotic to help avoid an upset stomach or diarrhea.  Check with your health care provider before supplementing your treatment. Many alternative therapies have not been proven and may be harmful to you.  Keep all follow-up visits as told by your health care provider. This is important. How is this prevented? You can become reinfected if you get another tick bite from an infected tick. Take these steps to help prevent an infection:  Cover your skin with light-colored clothing when you are outdoors in the spring and summer months.  Spray clothing and skin with bug spray. The spray should be 20-30% DEET. You can also treat clothing with permethrin, and let it dry before you wear it. Do not apply permethrin directly to your skin. Permethrin can also be used to treat camping gear and boots. Always read and follow the instructions that come with a bug spray or insecticide.  Avoid wooded, grassy, and shaded areas.  Remove yard litter, brush, trash, and plants that attract deer and rodents.  Check yourself for   ticks when you come indoors.  Wash clothing worn each day.  Shower after spending time outdoors.  Check your pets for ticks before they come inside.  If you find a tick attached to your skin: ? Remove it with tweezers. ? Clean your hands and the bite area with rubbing alcohol or soap and water. ? Dispose of the tick by putting it in rubbing alcohol, putting it in a sealed bag or container, or  flushing it down the toilet. ? You may choose to save the tick in a sealed container if you wish for it to be tested at a later time. Pregnant women should take special care to avoid tick bites because it is possible that the infection may be passed along to the fetus.   Contact a health care provider if:  You have symptoms after treatment.  You have removed a tick and want to bring it to your health care provider for testing. Get help right away if:  You have an irregular heartbeat.  You have chest pain.  You have nerve pain.  Your face feels numb.  You develop the following: ? A stiff neck. ? A severe headache. ? Severe nausea and vomiting. ? Sensitivity to light. Summary  Lyme disease is an infection that can affect many parts of the body, including the skin, joints, and nervous system.  This condition is caused by bacteria called Borrelia burgdorferi.  You can get Lyme disease by being bitten by an infected tick.  The main treatment for this condition is antibiotic medicine. This information is not intended to replace advice given to you by your health care provider. Make sure you discuss any questions you have with your health care provider. Document Revised: 11/07/2018 Document Reviewed: 10/02/2018 Elsevier Patient Education  2021 Elsevier Inc.  

## 2020-09-23 DIAGNOSIS — M9902 Segmental and somatic dysfunction of thoracic region: Secondary | ICD-10-CM | POA: Diagnosis not present

## 2020-09-23 DIAGNOSIS — M9901 Segmental and somatic dysfunction of cervical region: Secondary | ICD-10-CM | POA: Diagnosis not present

## 2020-09-23 DIAGNOSIS — M546 Pain in thoracic spine: Secondary | ICD-10-CM | POA: Diagnosis not present

## 2020-09-23 DIAGNOSIS — M542 Cervicalgia: Secondary | ICD-10-CM | POA: Diagnosis not present

## 2020-09-26 DIAGNOSIS — M9902 Segmental and somatic dysfunction of thoracic region: Secondary | ICD-10-CM | POA: Diagnosis not present

## 2020-09-26 DIAGNOSIS — M546 Pain in thoracic spine: Secondary | ICD-10-CM | POA: Diagnosis not present

## 2020-09-26 DIAGNOSIS — M542 Cervicalgia: Secondary | ICD-10-CM | POA: Diagnosis not present

## 2020-09-26 DIAGNOSIS — M9901 Segmental and somatic dysfunction of cervical region: Secondary | ICD-10-CM | POA: Diagnosis not present

## 2020-09-28 DIAGNOSIS — M546 Pain in thoracic spine: Secondary | ICD-10-CM | POA: Diagnosis not present

## 2020-09-28 DIAGNOSIS — M9901 Segmental and somatic dysfunction of cervical region: Secondary | ICD-10-CM | POA: Diagnosis not present

## 2020-09-28 DIAGNOSIS — M542 Cervicalgia: Secondary | ICD-10-CM | POA: Diagnosis not present

## 2020-09-28 DIAGNOSIS — M9902 Segmental and somatic dysfunction of thoracic region: Secondary | ICD-10-CM | POA: Diagnosis not present

## 2020-10-06 ENCOUNTER — Ambulatory Visit: Payer: Medicare HMO

## 2020-10-13 DIAGNOSIS — D090 Carcinoma in situ of bladder: Secondary | ICD-10-CM | POA: Diagnosis not present

## 2020-10-13 DIAGNOSIS — I10 Essential (primary) hypertension: Secondary | ICD-10-CM | POA: Diagnosis not present

## 2020-10-13 DIAGNOSIS — R42 Dizziness and giddiness: Secondary | ICD-10-CM | POA: Diagnosis not present

## 2020-10-13 DIAGNOSIS — Z0189 Encounter for other specified special examinations: Secondary | ICD-10-CM | POA: Diagnosis not present

## 2020-10-18 ENCOUNTER — Other Ambulatory Visit: Payer: Self-pay | Admitting: Family Medicine

## 2020-10-18 DIAGNOSIS — Z79899 Other long term (current) drug therapy: Secondary | ICD-10-CM

## 2020-10-18 DIAGNOSIS — E781 Pure hyperglyceridemia: Secondary | ICD-10-CM

## 2020-10-18 DIAGNOSIS — I10 Essential (primary) hypertension: Secondary | ICD-10-CM

## 2020-10-18 NOTE — Telephone Encounter (Signed)
Lab Results  Component Value Date   HGBA1C 5.9 (H) 07/13/2019    Lab Results  Component Value Date   CREATININE 0.89 04/20/2020     Lab Results  Component Value Date   CHOL 190 07/13/2019   HDL 42 07/13/2019   LDLCALC 107 (H) 07/13/2019   TRIG 241 (H) 07/13/2019   CHOLHDL 4.5 (H) 07/13/2019     BP Readings from Last 3 Encounters:  09/22/20 132/80  08/17/20 (!) 152/86  07/30/20 (!) 139/97

## 2020-10-18 NOTE — Addendum Note (Signed)
Addended by: Vicente Males on: 10/18/2020 03:25 PM   Modules accepted: Orders

## 2020-10-18 NOTE — Telephone Encounter (Signed)
Lab orders placed and mailed to patient with note to schedule appt in 30 days.

## 2020-10-18 NOTE — Telephone Encounter (Signed)
Pt need labs and appt to f/u htn.    In next 30 days.   Labs pls order cbc, cmp, lipids. Thx. Dr. Darene Lamer

## 2020-11-01 DIAGNOSIS — R42 Dizziness and giddiness: Secondary | ICD-10-CM | POA: Diagnosis not present

## 2020-11-01 DIAGNOSIS — I10 Essential (primary) hypertension: Secondary | ICD-10-CM | POA: Diagnosis not present

## 2020-11-01 DIAGNOSIS — Z131 Encounter for screening for diabetes mellitus: Secondary | ICD-10-CM | POA: Diagnosis not present

## 2020-11-01 DIAGNOSIS — Z0001 Encounter for general adult medical examination with abnormal findings: Secondary | ICD-10-CM | POA: Diagnosis not present

## 2020-11-09 DIAGNOSIS — E039 Hypothyroidism, unspecified: Secondary | ICD-10-CM | POA: Diagnosis not present

## 2020-11-09 DIAGNOSIS — D090 Carcinoma in situ of bladder: Secondary | ICD-10-CM | POA: Diagnosis not present

## 2020-11-09 DIAGNOSIS — R7303 Prediabetes: Secondary | ICD-10-CM | POA: Diagnosis not present

## 2020-11-09 DIAGNOSIS — E782 Mixed hyperlipidemia: Secondary | ICD-10-CM | POA: Diagnosis not present

## 2020-11-09 DIAGNOSIS — I10 Essential (primary) hypertension: Secondary | ICD-10-CM | POA: Diagnosis not present

## 2020-11-09 DIAGNOSIS — R42 Dizziness and giddiness: Secondary | ICD-10-CM | POA: Diagnosis not present

## 2020-11-09 DIAGNOSIS — J45909 Unspecified asthma, uncomplicated: Secondary | ICD-10-CM | POA: Diagnosis not present

## 2020-11-11 ENCOUNTER — Other Ambulatory Visit: Payer: Self-pay | Admitting: Family Medicine

## 2020-11-14 NOTE — Telephone Encounter (Signed)
This is no longer our patient seeing another provider

## 2020-11-15 DIAGNOSIS — R42 Dizziness and giddiness: Secondary | ICD-10-CM | POA: Diagnosis not present

## 2020-11-15 DIAGNOSIS — M542 Cervicalgia: Secondary | ICD-10-CM | POA: Diagnosis not present

## 2020-11-15 DIAGNOSIS — M130 Polyarthritis, unspecified: Secondary | ICD-10-CM | POA: Diagnosis not present

## 2020-11-15 DIAGNOSIS — F0781 Postconcussional syndrome: Secondary | ICD-10-CM | POA: Diagnosis not present

## 2020-11-17 ENCOUNTER — Other Ambulatory Visit: Payer: Self-pay | Admitting: Family Medicine

## 2020-11-29 ENCOUNTER — Encounter: Payer: Self-pay | Admitting: Gastroenterology

## 2020-12-01 ENCOUNTER — Other Ambulatory Visit: Payer: Self-pay | Admitting: Family Medicine

## 2020-12-16 DIAGNOSIS — E039 Hypothyroidism, unspecified: Secondary | ICD-10-CM | POA: Diagnosis not present

## 2020-12-21 DIAGNOSIS — I10 Essential (primary) hypertension: Secondary | ICD-10-CM | POA: Diagnosis not present

## 2020-12-21 DIAGNOSIS — E039 Hypothyroidism, unspecified: Secondary | ICD-10-CM | POA: Diagnosis not present

## 2021-01-10 DIAGNOSIS — M542 Cervicalgia: Secondary | ICD-10-CM | POA: Diagnosis not present

## 2021-01-10 DIAGNOSIS — M130 Polyarthritis, unspecified: Secondary | ICD-10-CM | POA: Diagnosis not present

## 2021-01-10 DIAGNOSIS — M79645 Pain in left finger(s): Secondary | ICD-10-CM | POA: Diagnosis not present

## 2021-01-10 DIAGNOSIS — R42 Dizziness and giddiness: Secondary | ICD-10-CM | POA: Diagnosis not present

## 2021-01-10 DIAGNOSIS — F0781 Postconcussional syndrome: Secondary | ICD-10-CM | POA: Diagnosis not present

## 2021-01-24 ENCOUNTER — Ambulatory Visit: Payer: Medicare HMO | Admitting: Gastroenterology

## 2021-01-31 DIAGNOSIS — R051 Acute cough: Secondary | ICD-10-CM | POA: Diagnosis not present

## 2021-01-31 DIAGNOSIS — Z20822 Contact with and (suspected) exposure to covid-19: Secondary | ICD-10-CM | POA: Diagnosis not present

## 2021-02-15 ENCOUNTER — Other Ambulatory Visit: Payer: Medicare HMO | Admitting: Urology

## 2021-02-22 ENCOUNTER — Other Ambulatory Visit: Payer: Self-pay

## 2021-02-22 ENCOUNTER — Ambulatory Visit: Payer: Medicare HMO | Admitting: Urology

## 2021-02-22 ENCOUNTER — Encounter: Payer: Self-pay | Admitting: Urology

## 2021-02-22 VITALS — BP 164/95 | HR 91 | Ht 66.5 in | Wt 191.2 lb

## 2021-02-22 DIAGNOSIS — C672 Malignant neoplasm of lateral wall of bladder: Secondary | ICD-10-CM

## 2021-02-22 LAB — MICROSCOPIC EXAMINATION
RBC, Urine: NONE SEEN /hpf (ref 0–2)
Renal Epithel, UA: NONE SEEN /hpf

## 2021-02-22 LAB — URINALYSIS, ROUTINE W REFLEX MICROSCOPIC
Bilirubin, UA: NEGATIVE
Glucose, UA: NEGATIVE
Leukocytes,UA: NEGATIVE
Nitrite, UA: NEGATIVE
RBC, UA: NEGATIVE
Specific Gravity, UA: 1.015 (ref 1.005–1.030)
Urobilinogen, Ur: 0.2 mg/dL (ref 0.2–1.0)
pH, UA: 7.5 (ref 5.0–7.5)

## 2021-02-22 MED ORDER — CIPROFLOXACIN HCL 500 MG PO TABS
500.0000 mg | ORAL_TABLET | Freq: Once | ORAL | Status: AC
  Administered 2021-02-22: 500 mg via ORAL

## 2021-02-22 NOTE — Patient Instructions (Signed)
Bladder Cancer Bladder cancer is a condition in which abnormal tissue (a tumor) grows in the bladder. The bladder is the organ that holds urine. Two tubes (ureters) carry the urine from the kidneys to the bladder. The bladder wall is made of layers of tissue. Cancer that spreads through these layers of the bladder wall becomes more difficult to treat. What are the causes? The cause of this condition is not known. What increases the risk? The following factors may make you more likely to develop this condition: Smoking. Working where there are risks (occupational exposures), such as working with rubber, leather, clothing fabric, dyes, chemicals, and paint. Being 77 years of age or older. Being female. Having bladder inflammation that is long-term (chronic). Having a history of cancer, including: A family history of bladder cancer. Personal experience with bladder cancer. Having had certain treatments for cancer before. These include: Medicines to kill cancer cells (chemotherapy). Strong X-ray beams or capsules high in energy to kill cancer cells and shrink tumors (radiation therapy). Having been exposed to arsenic. This is a chemical element that can poison you. What are the signs or symptoms? Early symptoms of this condition include: Seeing blood in your urine. Feeling pain when urinating. Having infections of your urinary system (urinary tract infections or UTIs) that happen often. Having to urinate sooner or more often than usual. Later symptoms of this condition include: Not being able to urinate. Pain on one side of your lower back. Loss of appetite. Weight loss. Tiredness (fatigue). Swelling in your feet. Bone pain. How is this diagnosed? This condition is diagnosed based on: Your medical history. A physical exam. Lab tests, such as urine tests. Imaging tests. Your symptoms. You may also have other tests or procedures done, such as: A cystoscopy. A narrow tube is inserted  into your bladder through the organ that connects your bladder to the outside of your body (urethra). This is done to view the lining of your bladder for tumors. A biopsy. This procedure involves removing a tissue sample to look at it under a microscope to see if cancer is present. It is important to find out: How deeply into the bladder wall cancer has grown. Whether cancer has spread to any other parts of your body. This may require blood tests or imaging tests, such as a CT scan, MRI, bone scan, or X-rays. How is this treated? Your health care provider may recommend one or more types of treatment based on the stage of your cancer. The most common types of treatment are: Surgery to remove the cancer. Procedures that may be done include: Removing a tumor on the inside wall of the bladder (transurethral resection). Removing the bladder (cystectomy). Radiation therapy. This is often used together with chemotherapy. Chemotherapy. Immunotherapy. This uses medicines to help your immune system destroy cancer cells. Follow these instructions at home: Take over-the-counter and prescription medicines only as told by your health care provider. Eat a healthy diet. Some of your treatments might affect your appetite. Do not use any products that contain nicotine or tobacco, such as cigarettes, e-cigarettes, and chewing tobacco. If you need help quitting, ask your health care provider. Consider joining a support group. This may help you learn to cope with the stress of having bladder cancer. Tell your cancer care team if you develop side effects. Your team may be able to recommend ways to get relief. Keep all follow-up visits as told by your health care provider. This is important. Where to find more information American   Cancer Society: www.cancer.org National Cancer Institute (NCI): www.cancer.gov Contact a health care provider if: You have symptoms of a urinary tract infection. These  include: Fever. Chills. Weakness. Muscle aches. Pain in your abdomen. Urge to urinate that is stronger and happens more often than usual. Burning feeling in the bladder or urethra when you urinate. Get help right away if: There is blood in your urine. You cannot urinate. You have severe pain or other symptoms that do not go away. Summary Bladder cancer is a condition in which tumors grow in the bladder and cause illness. This condition is diagnosed based on your medical history, a physical exam, lab tests, imaging tests, and your symptoms. Your health care provider may recommend one or more types of treatment based on the stage of your cancer. Consider joining a support group. This may help you learn to cope with the stress of having bladder cancer. This information is not intended to replace advice given to you by your health care provider. Make sure you discuss any questions you have with your health care provider. Document Revised: 03/25/2019 Document Reviewed: 03/25/2019 Elsevier Patient Education  2022 Elsevier Inc.  

## 2021-02-22 NOTE — Progress Notes (Signed)
Urological Symptom Review  Patient is experiencing the following symptoms: Frequent urination Get up at night to urinate Leakage of urine   Review of Systems  Gastrointestinal (upper)  : Negative for upper GI symptoms  Gastrointestinal (lower) : Negative for lower GI symptoms  Constitutional : Weight loss  Skin: Negative for skin symptoms  Eyes: Negative for eye symptoms  Ear/Nose/Throat : Sinus problems  Hematologic/Lymphatic: Negative for Hematologic/Lymphatic symptoms  Cardiovascular : Negative for cardiovascular symptoms  Respiratory : Shortness of breath  Endocrine: Negative for endocrine symptoms  Musculoskeletal: Negative for musculoskeletal symptoms  Neurological: Negative for neurological symptoms  Psychologic: Negative for psychiatric symptoms

## 2021-02-22 NOTE — Progress Notes (Signed)
   02/22/21  [id number] bladder cancer   HPI: Kayla [name] is a 75yo here for followup for bladder cancer. No worsening LUTS. No hematuria or dysuria Blood pressure (!) 164/95, pulse 91, height 5' 6.5" (1.689 m), weight 191 lb 4 oz (86.8 kg). NED. A&Ox3.   No respiratory distress   Abd soft, NT, ND Normal external genitalia with patent urethral meatus  Cystoscopy Procedure Note  Patient identification was confirmed, informed consent was obtained, and patient was prepped using Betadine solution.  Lidocaine jelly was administered per urethral meatus.    Procedure: - Flexible cystoscope introduced, without any difficulty.   - Thorough search of the bladder revealed:    normal urethral meatus    normal urothelium    no stones    no ulcers     no tumors    no urethral polyps    no trabeculation  - Ureteral orifices were normal in position and appearance.  Post-Procedure: - Patient tolerated the procedure well  Assessment/ Plan:    Return in about 4 weeks (around 03/22/2021) for NV 3 week course of BCG, followup 7 months for cystoscopy.  Nicolette Bang, MD

## 2021-03-21 ENCOUNTER — Ambulatory Visit (INDEPENDENT_AMBULATORY_CARE_PROVIDER_SITE_OTHER): Payer: Medicare HMO

## 2021-03-21 ENCOUNTER — Other Ambulatory Visit: Payer: Self-pay

## 2021-03-21 DIAGNOSIS — C672 Malignant neoplasm of lateral wall of bladder: Secondary | ICD-10-CM

## 2021-03-21 LAB — URINALYSIS, ROUTINE W REFLEX MICROSCOPIC
Bilirubin, UA: NEGATIVE
Glucose, UA: NEGATIVE
Ketones, UA: NEGATIVE
Leukocytes,UA: NEGATIVE
Nitrite, UA: NEGATIVE
RBC, UA: NEGATIVE
Specific Gravity, UA: >1.03 — ABNORMAL HIGH (ref 1.005–1.030)
Urobilinogen, Ur: 0.2 mg/dL (ref 0.2–1.0)
pH, UA: 6 (ref 5.0–7.5)

## 2021-03-21 MED ORDER — BCG LIVE 50 MG IS SUSR
3.2400 mL | Freq: Once | INTRAVESICAL | Status: AC
  Administered 2021-03-21: 81 mg via INTRAVESICAL

## 2021-03-21 NOTE — Progress Notes (Signed)
BCG Bladder Instillation  BCG # 1 of 3  Due to Bladder Cancer patient is present today for a BCG treatment. Patient was cleaned and prepped in a sterile fashion with betadine. A 14FR catheter was inserted, urine return was noted 57m, urine was yellow in color.  560mof reconstituted BCG was instilled into the bladder. The catheter was then removed. Patient tolerated well, no complications were noted  Performed by: Shray Hunley LPN  Follow up/ Additional notes: keep next scheduled NV

## 2021-03-21 NOTE — Patient Instructions (Signed)

## 2021-03-28 ENCOUNTER — Ambulatory Visit (INDEPENDENT_AMBULATORY_CARE_PROVIDER_SITE_OTHER): Payer: Medicare HMO

## 2021-03-28 ENCOUNTER — Other Ambulatory Visit: Payer: Self-pay

## 2021-03-28 DIAGNOSIS — C672 Malignant neoplasm of lateral wall of bladder: Secondary | ICD-10-CM

## 2021-03-28 LAB — URINALYSIS, ROUTINE W REFLEX MICROSCOPIC
Bilirubin, UA: NEGATIVE
Glucose, UA: NEGATIVE
Ketones, UA: NEGATIVE
Leukocytes,UA: NEGATIVE
Nitrite, UA: NEGATIVE
Protein,UA: NEGATIVE
RBC, UA: NEGATIVE
Specific Gravity, UA: 1.025 (ref 1.005–1.030)
Urobilinogen, Ur: 0.2 mg/dL (ref 0.2–1.0)
pH, UA: 6.5 (ref 5.0–7.5)

## 2021-03-28 MED ORDER — CIPROFLOXACIN HCL 500 MG PO TABS
500.0000 mg | ORAL_TABLET | Freq: Once | ORAL | Status: AC
  Administered 2021-03-28: 500 mg via ORAL

## 2021-03-28 MED ORDER — BCG LIVE 50 MG IS SUSR
3.2400 mL | Freq: Once | INTRAVESICAL | Status: AC
  Administered 2021-03-28: 81 mg via INTRAVESICAL

## 2021-03-28 NOTE — Patient Instructions (Signed)

## 2021-03-28 NOTE — Progress Notes (Signed)
BCG Bladder Instillation  BCG # 2 of 3  Due to Bladder Cancer patient is present today for a BCG treatment. Patient was cleaned and prepped in a sterile fashion with betadine. A 14FR catheter was inserted, urine return was noted 77m, urine was yellow in color.  592mof reconstituted BCG was instilled into the bladder. The catheter was then removed. Patient tolerated well, complications were noted as: difficult to find urethra. '500mg'$  Cipro given x1 per Dr. DaDiona Fanti Performed by: Rex Oesterle LPN  Follow up/ Additional notes: Keep next scheduled NV

## 2021-03-30 ENCOUNTER — Other Ambulatory Visit: Payer: Self-pay

## 2021-03-30 ENCOUNTER — Encounter: Payer: Self-pay | Admitting: Gastroenterology

## 2021-03-30 ENCOUNTER — Ambulatory Visit: Payer: Medicare HMO | Admitting: Gastroenterology

## 2021-03-30 DIAGNOSIS — R140 Abdominal distension (gaseous): Secondary | ICD-10-CM

## 2021-03-30 NOTE — Progress Notes (Signed)
Referring Provider: Erven Colla, DO Primary Care Physician:  Celene Squibb, MD Primary GI: Dr. Gala Romney   Chief Complaint  Patient presents with   Follow-up    History of bloating, now improved with fiber     HPI:   Kayla [name] is a 75 y.o. female presenting today with a history of constipation, chronic bloating, and GERD. Last seen in Dec 2021. Addition of fiber has helped historically with bloating. To be thorough, celiac serologies were checked and negative.   Changing diet to be healthier. No rectal bleeding. Hasn't needed Pepcid. Cut back on spicy foods. No dysphagia. Bloating resolved. BM daily. No straining. No dysphagia. Purposefully losing weight.   She feels well today. Next colonoscopy due in May 2024 due to history of adenomas.   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 Gala Romney: Multiple colonic polyps removed, tubular adenomas.  Next colonoscopy in 5 years   COLONOSCOPY N/A 12/25/2019   five semi-pedunculated polyps in descending colon and IC valve, 3-7 mm in size. Tubular adenomas. 3 year surveillance.    CYSTOSCOPY W/ URETERAL STENT PLACEMENT Bilateral 05/05/2018   Procedure: CYSTOSCOPY WITH BILATERAL RETROGRADE PYELOGRAM/ RIGHT URETERAL STENT PLACEMENT;  Surgeon: Cleon Gustin, MD;  Location: [facility] ORS;  Service: Urology;  Laterality: Bilateral;   LUMBAR DISC SURGERY     PARTIAL HYSTERECTOMY     POLYPECTOMY  12/25/2019   Procedure: POLYPECTOMY;  Surgeon: Daneil Dolin, MD;  Location: [facility] END[facility] SUITE;  Service: Endoscopy;;  ileocecal valve polyp; descending x2;   TRANSURETHRAL RESECTION OF BLADDER TUMOR N/A 05/05/2018   Procedure: TRANSURETHRAL RESECTION OF BLADDER TUMOR (TURBT);  Surgeon: Cleon Gustin, MD;  Location: [facility] ORS;  Service: Urology;  Laterality: N/A;   TRANSURETHRAL RESECTION OF BLADDER TUMOR N/A 06/16/2018   Procedure: TRANSURETHRAL RESECTION OF BLADDER TUMOR (TURBT);  Surgeon: Cleon Gustin, MD;  Location: [facility] ORS;  Service: Urology;  Laterality: N/A;    Current Outpatient Medications  Medication Sig Dispense Refill   Ascorbic Acid (VITAMIN C) 1000 MG tablet Take 1,000 mg by mouth daily.     Cholecalciferol (VITAMIN D) 2000 units tablet Take 2,000 Units by mouth daily. When she remembers     Famotidine (PEPCID AC PO) Take by mouth. As needed     Garlic [id number] MG TBEC Take by mouth. 2-3 times per week     hydrochlorothiazide (HYDRODIURIL) 12.5 MG tablet TAKE 1 TABLET BY MOUTH EVERY DAY 30 tablet 0   loratadine (CLARITIN) 10 MG tablet Take 10 mg by mouth daily as needed for allergies.     Melatonin 10 MG TABS Take 10 mg by mouth daily as needed (sleep).     meloxicam (MOBIC) 7.5 MG tablet TAKE 1 TABLET BY MOUTH EVERY DAY AS NEEDED FOR PAIN (Patient taking differently: Take 7.5 mg by mouth daily  as needed for pain.) 30 tablet 5   psyllium (METAMUCIL) 58.6 % packet Take 1 packet by mouth. 1-3 times per week     TURMERIC PO Take 2.5 mLs by mouth daily. Liquid  1-2 times per week     fluticasone (FLONASE) 50 MCG/ACT nasal spray Place 2 sprays into both nostrils daily for 5 days. 9.9 mL 2   No current facility-administered medications for this visit.    Allergies as of 03/30/2021 - Review Complete 03/30/2021  Allergen Reaction Noted   Verapamil Swelling 07/19/2013    Family History  Problem Relation Age of Onset   Cancer Father        colon cancer; also brother-colon And sister-breast   Heart disease Father    Heart disease  Mother        at advanced age   Hypertension Mother    Hyperlipidemia Mother    Osteoporosis Mother    Cancer Brother        liver and colon   Breast cancer Sister    Cancer Sister 61       breast   Breast cancer Sister    Breast cancer Paternal Aunt     Social History   Socioeconomic History   Marital status: Married    Spouse name: Not on file   Number of children: 2   Years of education: Not on file   Highest education level: Not on file  Occupational History   Occupation: Technical sales engineer    Comment: Laundry, Pharmacist, community  Tobacco Use   Smoking status: Never   Smokeless tobacco: Never  Vaping Use   Vaping Use: Never used  Substance and Sexual Activity   Alcohol use: Yes    Alcohol/week: 2.0 standard drinks    Types: 2 Glasses of wine per week    Comment: occ wine   Drug use: No   Sexual activity: Not Currently    Birth control/protection: Post-menopausal, Surgical  Other Topics Concern   Not on file  Social History Narrative   Exercises regularly   Social Determinants of Health   Financial Resource Strain: Not on file  Food Insecurity: Not on file  Transportation Needs: Not on file  Physical Activity: Not on file  Stress: Not on file  Social Connections: Not on file    Review of Systems: Gen: Denies fever, chills, anorexia. Denies fatigue, weakness, weight loss.  CV: Denies chest pain, palpitations, syncope, peripheral edema, and claudication. Resp: Denies dyspnea at rest, cough, wheezing, coughing up blood, and pleurisy. GI: see HPI Derm: Denies rash, itching, dry skin Psych: Denies depression, anxiety, memory loss, confusion. No homicidal or suicidal ideation.  Heme: Denies bruising, bleeding, and enlarged lymph nodes.  Physical Exam: BP (!) 136/92   Pulse 80   Temp (!) 97.3 F (36.3 C)   Ht '5\' 7"'$  (1.702 m)   Wt 189 lb (85.7 kg)   BMI 29.60 kg/m  General:   Alert and oriented. No distress noted. Pleasant and cooperative.  Head:   Normocephalic and atraumatic. Eyes:  Conjuctiva clear without scleral icterus. Mouth:  mask in place Abdomen:  +BS, soft, non-tender and non-distended. No rebound or guarding. No HSM or masses noted. Msk:  Symmetrical without gross deformities. Normal posture. Extremities:  Without edema. Neurologic:  Alert and  oriented x4 Psych:  Alert and cooperative. Normal mood and affect.  ASSESSMENT/PLAN: CHARLESZETTA SCHOEFF is a 75 y.o. female presenting today in follow-up for constipation, bloating, and GERD.  She is doing  quite well with addition of Metamucil daily. Symptoms of bloating have resolved. Celiac serologies negative.    GERD controlled with dietary measures.  We will see her back prn, as she is doing well.   Colonoscopy in May 2024. Will place on recall list for triage. She is to call with any concerns in the meantime.    Annitta Needs, PhD, ANP-BC [facility]

## 2021-03-30 NOTE — Patient Instructions (Signed)
I am glad you are doing well!  Continue fiber supplementation.   Your next colonoscopy is in 2024, and we will contact you when it is time!  We will see you back as needed. Please call for any questions!  I enjoyed seeing you again today! As you know, I value our relationship and want to provide genuine, compassionate, and quality care. I welcome your feedback. If you receive a survey regarding your visit,  I greatly appreciate you taking time to fill this out. See you next time!  Annitta Needs, PhD, ANP-BC [facility]

## 2021-04-04 ENCOUNTER — Ambulatory Visit (INDEPENDENT_AMBULATORY_CARE_PROVIDER_SITE_OTHER): Payer: Medicare HMO

## 2021-04-04 DIAGNOSIS — C672 Malignant neoplasm of lateral wall of bladder: Secondary | ICD-10-CM | POA: Diagnosis not present

## 2021-04-04 LAB — URINALYSIS, ROUTINE W REFLEX MICROSCOPIC
Bilirubin, UA: NEGATIVE
Glucose, UA: NEGATIVE
Ketones, UA: NEGATIVE
Nitrite, UA: NEGATIVE
Protein,UA: NEGATIVE
RBC, UA: NEGATIVE
Specific Gravity, UA: 1.025 (ref 1.005–1.030)
Urobilinogen, Ur: 0.2 mg/dL (ref 0.2–1.0)
pH, UA: 6 (ref 5.0–7.5)

## 2021-04-04 MED ORDER — BCG LIVE 50 MG IS SUSR
3.2400 mL | Freq: Once | INTRAVESICAL | Status: AC
  Administered 2021-04-04: 81 mg via INTRAVESICAL

## 2021-04-04 NOTE — Patient Instructions (Signed)

## 2021-04-04 NOTE — Progress Notes (Signed)
BCG Bladder Instillation  BCG # 3 of 3  Due to Bladder Cancer patient is present today for a BCG treatment. Patient was cleaned and prepped in a sterile fashion with betadine. A 14FR catheter was inserted, urine return was noted 61m, urine was yellow in color.  529mof reconstituted BCG was instilled into the bladder. The catheter was then removed. Patient tolerated well, no complications were noted  Performed by: Tobe Kervin LPN  Follow up/ Additional notes: Keep next scheduled OV

## 2021-05-31 ENCOUNTER — Other Ambulatory Visit: Payer: Self-pay

## 2021-05-31 ENCOUNTER — Other Ambulatory Visit (HOSPITAL_COMMUNITY): Payer: Self-pay | Admitting: Family Medicine

## 2021-05-31 ENCOUNTER — Ambulatory Visit (HOSPITAL_COMMUNITY)
Admission: RE | Admit: 2021-05-31 | Discharge: 2021-05-31 | Disposition: A | Discharge status: Home / Self Care | Payer: Medicare HMO | Source: Ambulatory Visit | Attending: Family Medicine | Admitting: Family Medicine

## 2021-05-31 DIAGNOSIS — M79672 Pain in left foot: Secondary | ICD-10-CM

## 2021-06-21 DIAGNOSIS — I10 Essential (primary) hypertension: Secondary | ICD-10-CM | POA: Diagnosis not present

## 2021-06-28 DIAGNOSIS — C679 Malignant neoplasm of bladder, unspecified: Secondary | ICD-10-CM | POA: Diagnosis not present

## 2021-06-28 DIAGNOSIS — E782 Mixed hyperlipidemia: Secondary | ICD-10-CM | POA: Diagnosis not present

## 2021-06-28 DIAGNOSIS — R062 Wheezing: Secondary | ICD-10-CM | POA: Diagnosis not present

## 2021-06-28 DIAGNOSIS — E039 Hypothyroidism, unspecified: Secondary | ICD-10-CM | POA: Diagnosis not present

## 2021-06-28 DIAGNOSIS — I10 Essential (primary) hypertension: Secondary | ICD-10-CM | POA: Diagnosis not present

## 2021-06-28 DIAGNOSIS — R739 Hyperglycemia, unspecified: Secondary | ICD-10-CM | POA: Diagnosis not present

## 2021-06-28 DIAGNOSIS — R809 Proteinuria, unspecified: Secondary | ICD-10-CM | POA: Diagnosis not present

## 2021-06-28 DIAGNOSIS — M792 Neuralgia and neuritis, unspecified: Secondary | ICD-10-CM | POA: Diagnosis not present

## 2021-07-18 NOTE — Progress Notes (Signed)
[address] [place], [place] [address]   CLINIC:  Medical Oncology/Hematology  CONSULT NOTE  Patient Care Team: Celene Squibb, MD as PCP - General (Internal Medicine)  CHIEF COMPLAINTS/PURPOSE OF CONSULTATION:  Evaluation of bladder cancer  HISTORY OF PRESENTING ILLNESS:  [name] 75 y.o. female is here because of evaluation of bladder cancer.  Today she reports feeling good. She has been receiving BCG injection since January 2020. She reports dysuria and hematuria on the days she receives injections, and these symptoms resolve by the next day. She reports nocturia, disrupted sleep, and frequent urination; she reports urinating 4-5 times a day. She denies history of CVA MI, PE, and DVT.   She currently lives at home with her husband. Prior to retirement, she worked in [place] where she reports exposure to chemicals and dyes. She denies smoking history. Her father had colon cancer, her brother had colon cancer, another brother had lung cancer, 2 sisters had breast cancer, and another sister had bladder cancer with a history of smoking.   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 Gala Romney: Multiple colonic polyps removed, tubular adenomas.  Next colonoscopy in 5 years   COLONOSCOPY N/A 12/25/2019   five semi-pedunculated polyps in descending colon and IC valve, 3-7 mm in size. Tubular adenomas. 3 year  surveillance.    CYSTOSCOPY W/ URETERAL STENT PLACEMENT Bilateral 05/05/2018   Procedure: CYSTOSCOPY WITH BILATERAL RETROGRADE PYELOGRAM/ RIGHT URETERAL STENT PLACEMENT;  Surgeon: Cleon Gustin, MD;  Location: [facility];  Service: Urology;  Laterality: Bilateral;   LUMBAR DISC SURGERY     PARTIAL HYSTERECTOMY     POLYPECTOMY  12/25/2019   Procedure: POLYPECTOMY;  Surgeon: Daneil Dolin, MD;  Location: [facility] SU[facility];  Service: Endoscopy;;  ileocecal valve polyp; descending x2;   TRANSURETHRAL RESECTION OF BLADDER TUMOR N/A 05/05/2018   Procedure: TRANSURETHRAL RESECTION OF BLADDER TUMOR (TURBT);  Surgeon: Cleon Gustin, MD;  Location: [facility];  Service: Urology;  Laterality: N/A;   TRANSURETHRAL RESECTION OF BLADDER TUMOR N/A 06/16/2018   Procedure: TRANSURETHRAL RESECTION OF BLADDER TUMOR (TURBT);  Surgeon: Cleon Gustin, MD;  Location: [facility] ORS;  Service: Urology;  Laterality: N/A;    SOCIAL HISTORY: Social History   Socioeconomic History   Marital status: Married    Spouse name: Not on file   Number of children: 2   Years of education: Not on file   Highest education level: Not on file  Occupational History   Occupation: Technical sales engineer    Comment: Laundry, Pharmacist, community  Tobacco Use   Smoking status: Never   Smokeless tobacco: Never  Vaping Use   Vaping Use: Never used  Substance and Sexual Activity   Alcohol use: Yes    Alcohol/week: 2.0 standard drinks  Types: 2 Glasses of wine per week    Comment: occ wine   Drug use: No   Sexual activity: Not Currently    Birth control/protection: Post-menopausal, Surgical  Other Topics Concern   Not on file  Social History Narrative   Exercises regularly   Social Determinants of Health   Financial Resource Strain: Not on file  Food Insecurity: Not on file  Transportation Needs: Not on file  Physical Activity: Not on file  Stress: Not on file  Social Connections: Not on file  Intimate Partner Violence: Not on file     FAMILY HISTORY: Family History  Problem Relation Age of Onset   Cancer Father        colon cancer; also brother-colon And sister-breast   Heart disease Father    Heart disease Mother        at advanced age   Hypertension Mother    Hyperlipidemia Mother    Osteoporosis Mother    Cancer Brother        liver and colon   Breast cancer Sister    Cancer Sister 68       breast   Breast cancer Sister    Breast cancer Paternal Aunt     ALLERGIES:  is allergic to verapamil.  MEDICATIONS:  Current Outpatient Medications  Medication Sig Dispense Refill   albuterol (VENTOLIN HFA) 108 (90 Base) MCG/ACT inhaler albuterol sulfate HFA 90 mcg/actuation aerosol inhaler  INHALE 2 PUFFS EVERY 4 HOURS BY INHALATION ROUTE.     Ascorbic Acid (VITAMIN C) 1000 MG tablet Take 1,000 mg by mouth daily.     Cholecalciferol (VITAMIN D) 2000 units tablet Take 2,000 Units by mouth daily. When she remembers     Famotidine (PEPCID AC PO) Take by mouth. As needed     fluticasone (FLONASE) 50 MCG/ACT nasal spray Place 2 sprays into both nostrils daily for 5 days. 9.9 mL 2   gabapentin (NEURONTIN) 100 MG capsule gabapentin 100 mg capsule  TAKE 1 CAPSULE BY MOUTH TWICE A DAY AS NEEDED     Garlic 502 MG TBEC Take by mouth. 2-3 times per week     hydrochlorothiazide (HYDRODIURIL) 12.5 MG tablet TAKE 1 TABLET BY MOUTH EVERY DAY 30 tablet 0   levothyroxine (SYNTHROID) 25 MCG tablet levothyroxine 25 mcg tablet     lisinopril (ZESTRIL) 5 MG tablet lisinopril 5 mg tablet     loratadine (CLARITIN) 10 MG tablet Take 10 mg by mouth daily as needed for allergies. (Patient not taking: Reported on 07/19/2021)     Melatonin 10 MG TABS Take 10 mg by mouth daily as needed (sleep). (Patient not taking: Reported on 07/19/2021)     meloxicam (MOBIC) 7.5 MG tablet TAKE 1 TABLET BY MOUTH EVERY DAY AS NEEDED FOR PAIN (Patient not taking: Reported on 07/19/2021) 30 tablet 5   psyllium (METAMUCIL) 58.6 % packet Take 1 packet by  mouth. 1-3 times per week     TURMERIC PO Take 2.5 mLs by mouth daily. Liquid  1-2 times per week     No current facility-administered medications for this visit.    REVIEW OF SYSTEMS:   Review of Systems  Constitutional:  Negative for appetite change and fatigue (70%).  Genitourinary:  Positive for frequency and nocturia. Negative for hematuria.   Musculoskeletal:  Positive for arthralgias.  Psychiatric/Behavioral:  Positive for sleep disturbance.   All other systems reviewed and are negative.   PHYSICAL EXAMINATION: ECOG PERFORMANCE STATUS: 1 - Symptomatic but completely ambulatory  Vitals:   07/19/21 1312  BP: 134/90  Pulse: 89  Resp: 20  Temp: (!) 96.7 F (35.9 C)  SpO2: 97%   Filed Weights   07/19/21 1312  Weight: 191 lb (86.6 kg)   Physical Exam Vitals reviewed.  Constitutional:      Appearance: Normal appearance.  Cardiovascular:     Rate and Rhythm: Normal rate and regular rhythm.     Pulses: Normal pulses.     Heart sounds: Normal heart sounds.  Pulmonary:     Effort: Pulmonary effort is normal.     Breath sounds: Normal breath sounds.  Abdominal:     Palpations: Abdomen is soft. There is no hepatomegaly, splenomegaly or mass.     Tenderness: There is no abdominal tenderness.  Musculoskeletal:     Right lower leg: No edema.     Left lower leg: No edema.  Lymphadenopathy:     Lower Body: No right inguinal adenopathy. No left inguinal adenopathy.  Neurological:     General: No focal deficit present.     Mental Status: She is alert and oriented to person, place, and time.  Psychiatric:        Mood and Affect: Mood normal.        Behavior: Behavior normal.     LABORATORY DATA:  I have reviewed the data as listed CBC Latest Ref Rng & Units 04/20/2020 04/30/2018  WBC 4.0 - 10.5 K/uL 6.6 7.2  Hemoglobin 12.0 - 15.0 g/dL 14.4 14.5  Hematocrit 36.0 - 46.0 % 43.8 43.5  Platelets 150 - 400 K/uL 213 192   CMP Latest Ref Rng & Units 04/20/2020 07/13/2019  08/04/2018  Glucose 70 - 99 mg/dL 104(H) 113(H) 80  BUN 8 - 23 mg/dL 16 16 18   Creatinine 0.44 - 1.00 mg/dL 0.89 0.98 0.83  Sodium 135 - 145 mmol/L 137 141 141  Potassium 3.5 - 5.1 mmol/L 4.1 4.4 4.4  Chloride 98 - 111 mmol/L 101 102 98  CO2 22 - 32 mmol/L 25 19(L) 22  Calcium 8.9 - 10.3 mg/dL 9.1 9.5 9.4  Total Protein 6.5 - 8.1 g/dL 6.8 6.7 7.0  Total Bilirubin 0.3 - 1.2 mg/dL 0.8 0.7 0.5  Alkaline Phos 38 - 126 U/L 49 65 64  AST 15 - 41 U/L 20 25 26   ALT 0 - 44 U/L 24 24 30     RADIOGRAPHIC STUDIES: I have personally reviewed the radiological images as listed and agreed with the findings in the report. No results found.  ASSESSMENT:  Nonmuscle invasive bladder cancer: - CTAP with and without contrast on 04/09/2018 showed 2.8 x 4.1 cm exophytic mass in the posterior right bladder near the UVJ.  No pelvic sidewall lymphadenopathy.  Upper normal to borderline lymph nodes seen in the hepatoduodenal ligament, atypical for bladder cancer in the absence of pelvic lymphadenopathy.  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 - Cystoscopy and transurethral resection on 05/05/2018. 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  Carcinoma invades lamina propria in the level below muscularis mucosa.  LVI +.  Muscularis propria is present and not involved. - She is started on intravesical BCG around January 2020. - She is currently receiving BCG weekly for 3 weeks every 6 months under the direction of Dr. Alyson Ingles.   Social/family history: - She lives at home with her husband.  She worked in [employer] and has exposure to chemicals.  She is non-smoker.  She also worked on a tobacco  farm. - Father had colon cancer.  Brother had colon cancer.  Another brother had lung cancer.  2 sisters had breast cancer.  1 sister had bladder cancer.   PLAN:  Nonmuscle invasive bladder cancer: - We have reviewed CT scan reports a diagnosis  and biopsy.  We have reviewed treatment plan of primary nonmuscle invasive urothelial bladder cancer with induction BCG followed by maintenance for 36 months for high risk disease. - She will continue monitoring with intermittent urine cytology and cystoscopy per Dr. Alyson Ingles. - She reports some urinary frequency particularly at nighttime, which she will discuss with Dr. Alyson Ingles. - At this time she does not require any further management from medical oncology. - RTC 1 year for follow-up.  2.  Fibroadenoma of the breast: - She had biopsies on right breast, last 1 on 06/18/2016 which showed fibroadenoma with calcifications. - She is continuing yearly mammograms.  Last mammogram on 02/09/2020 was BI-RADS Category 1.  3.  Health maintenance: - Last colonoscopy on 12/25/2019 with tubular adenomas removed at the ileocecal valve and descending colon. - Continue colonoscopies as per GI.   All questions were answered. The patient knows to call the clinic with any problems, questions or concerns.   Derek Jack, MD, 07/19/21 1:55 PM  [place] [phone]   I, Thana Ates, am acting as a scribe for Dr. Derek Jack.  I, Derek Jack MD, have reviewed the above documentation for accuracy and completeness, and I agree with the above.

## 2021-07-19 ENCOUNTER — Encounter (HOSPITAL_COMMUNITY): Payer: Self-pay | Admitting: Hematology

## 2021-07-19 ENCOUNTER — Inpatient Hospital Stay (HOSPITAL_COMMUNITY): Payer: Medicare HMO | Attending: Hematology | Admitting: Hematology

## 2021-07-19 ENCOUNTER — Other Ambulatory Visit: Payer: Self-pay

## 2021-07-19 VITALS — BP 134/90 | HR 89 | Temp 96.7°F | Resp 20 | Ht 66.25 in | Wt 191.0 lb

## 2021-07-19 DIAGNOSIS — G479 Sleep disorder, unspecified: Secondary | ICD-10-CM

## 2021-07-19 DIAGNOSIS — C672 Malignant neoplasm of lateral wall of bladder: Secondary | ICD-10-CM

## 2021-07-19 DIAGNOSIS — Z8249 Family history of ischemic heart disease and other diseases of the circulatory system: Secondary | ICD-10-CM

## 2021-07-19 DIAGNOSIS — Z803 Family history of malignant neoplasm of breast: Secondary | ICD-10-CM | POA: Diagnosis not present

## 2021-07-19 DIAGNOSIS — M2559 Pain in other specified joint: Secondary | ICD-10-CM

## 2021-07-19 DIAGNOSIS — Z8349 Family history of other endocrine, nutritional and metabolic diseases: Secondary | ICD-10-CM

## 2021-07-19 DIAGNOSIS — Z79899 Other long term (current) drug therapy: Secondary | ICD-10-CM | POA: Diagnosis not present

## 2021-07-19 DIAGNOSIS — R351 Nocturia: Secondary | ICD-10-CM | POA: Diagnosis not present

## 2021-07-19 DIAGNOSIS — Z801 Family history of malignant neoplasm of trachea, bronchus and lung: Secondary | ICD-10-CM | POA: Insufficient documentation

## 2021-07-19 DIAGNOSIS — Z8052 Family history of malignant neoplasm of bladder: Secondary | ICD-10-CM | POA: Insufficient documentation

## 2021-07-19 DIAGNOSIS — C679 Malignant neoplasm of bladder, unspecified: Secondary | ICD-10-CM | POA: Insufficient documentation

## 2021-07-19 DIAGNOSIS — N6021 Fibroadenosis of right breast: Secondary | ICD-10-CM

## 2021-07-19 DIAGNOSIS — R350 Frequency of micturition: Secondary | ICD-10-CM | POA: Diagnosis not present

## 2021-07-19 DIAGNOSIS — D241 Benign neoplasm of right breast: Secondary | ICD-10-CM | POA: Insufficient documentation

## 2021-07-19 DIAGNOSIS — Z800 Family history of malignant neoplasm of digestive organs: Secondary | ICD-10-CM | POA: Insufficient documentation

## 2021-07-19 DIAGNOSIS — Z8269 Family history of other diseases of the musculoskeletal system and connective tissue: Secondary | ICD-10-CM

## 2021-07-19 NOTE — Patient Instructions (Addendum)
[place] at [facility] Discharge Instructions   You were seen and examined today by Dr. Delton Coombes.  You are not requiring scans of your body because your bladder cancer is very localized.    Return as scheduled in 1 year for office visit with Dr. Raliegh Ip.    Thank you for choosing [place] at [facility] to provide your oncology and hematology care.  To afford each patient quality time with our provider, please arrive at least 15 minutes before your scheduled appointment time.   If you have a lab appointment with the [facility] please come in thru the Main Entrance and check in at the main information desk.  You need to re-schedule your appointment should you arrive 10 or more minutes late.  We strive to give you quality time with our providers, and arriving late affects you and other patients whose appointments are after yours.  Also, if you no show three or more times for appointments you may be dismissed from the clinic at the providers discretion.     Again, thank you for choosing [facility].  Our hope is that these requests will decrease the amount of time that you wait before being seen by our physicians.       _____________________________________________________________  Should you have questions after your visit to [facility], please contact our office at [phone] and follow the prompts.  Our office hours are 8:00 a.m. and 4:30 p.m. Monday - Friday.  Please note that voicemails left after 4:00 p.m. may not be returned until the following business day.  We are closed weekends and major holidays.  You do have access to a nurse 24-7, just call the main number to the clinic [phone] and do not press any options, hold on the line and a nurse will answer the phone.    For prescription refill requests, have your pharmacy contact our office and allow 72 hours.    Due to Covid, you will need to wear a mask  upon entering the hospital. If you do not have a mask, a mask will be given to you at the Main Entrance upon arrival. For doctor visits, patients may have 1 support person age 77 or older with them. For treatment visits, patients can not have anyone with them due to social distancing guidelines and our immunocompromised population.

## 2021-09-18 DIAGNOSIS — H5789 Other specified disorders of eye and adnexa: Secondary | ICD-10-CM | POA: Diagnosis not present

## 2021-09-20 ENCOUNTER — Other Ambulatory Visit: Payer: Self-pay

## 2021-09-20 ENCOUNTER — Emergency Department (HOSPITAL_COMMUNITY)
Admission: EM | Admit: 2021-09-20 | Discharge: 2021-09-20 | Disposition: A | Discharge status: Home / Self Care | Payer: Medicare HMO | Attending: Emergency Medicine | Admitting: Emergency Medicine

## 2021-09-20 DIAGNOSIS — J45909 Unspecified asthma, uncomplicated: Secondary | ICD-10-CM | POA: Diagnosis not present

## 2021-09-20 DIAGNOSIS — Z79899 Other long term (current) drug therapy: Secondary | ICD-10-CM | POA: Diagnosis not present

## 2021-09-20 DIAGNOSIS — Z7951 Long term (current) use of inhaled steroids: Secondary | ICD-10-CM | POA: Diagnosis not present

## 2021-09-20 DIAGNOSIS — H00012 Hordeolum externum right lower eyelid: Secondary | ICD-10-CM | POA: Diagnosis not present

## 2021-09-20 DIAGNOSIS — S0501XA Injury of conjunctiva and corneal abrasion without foreign body, right eye, initial encounter: Secondary | ICD-10-CM | POA: Diagnosis not present

## 2021-09-20 DIAGNOSIS — H00022 Hordeolum internum right lower eyelid: Secondary | ICD-10-CM | POA: Insufficient documentation

## 2021-09-20 DIAGNOSIS — X58XXXA Exposure to other specified factors, initial encounter: Secondary | ICD-10-CM | POA: Diagnosis not present

## 2021-09-20 DIAGNOSIS — I10 Essential (primary) hypertension: Secondary | ICD-10-CM | POA: Insufficient documentation

## 2021-09-20 MED ORDER — TETRACAINE HCL 0.5 % OP SOLN
2.0000 [drp] | Freq: Once | OPHTHALMIC | Status: AC
Start: 2021-09-20 — End: 2021-09-20
  Administered 2021-09-20: 2 [drp] via OPHTHALMIC
  Filled 2021-09-20: qty 4

## 2021-09-20 MED ORDER — TOBRAMYCIN 0.3 % OP SOLN
2.0000 [drp] | OPHTHALMIC | Status: DC
  Administered 2021-09-20: 2 [drp] via OPHTHALMIC
  Filled 2021-09-20: qty 5

## 2021-09-20 MED ORDER — FLUORESCEIN SODIUM 1 MG OP STRP
1.0000 | ORAL_STRIP | Freq: Once | OPHTHALMIC | Status: AC
  Administered 2021-09-20: 1 via OPHTHALMIC
  Filled 2021-09-20: qty 1

## 2021-09-20 NOTE — ED Provider Notes (Incomplete)
**Kayla [name] De-Identified via Obfuscation** Kayla Kayla [name]   CSN: [id number] Arrival date & time: 09/20/21  1056     History {Add pertinent medical, surgical, social history, OB history to HPI:1} Chief Complaint  Patient presents with   Eye Pain   eye redness    Kayla Kayla [name] is a 76 y.o. female.   Eye Pain Pertinent negatives include no headaches.      Kayla Kayla [name] is a 76 y.o. female with past medical history of hypertension IBS and asthma who presents to the Emergency Department complaining of redness, itching and foreign body sensation of the right eye.  She states that she was cleaning out a house that was very dusty and dirty on Saturday.  Noticed irritation of the right eye later that evening.  She was seen by PCP yesterday.  She was prescribed polymyxin drops to use in her eye.  Since using the drops, she reports increased discomfort of her eye and increased redness.  No visual deficits.  She denies dizziness or headache as well.  She does not recall any injury to her eye.  She wears glasses for reading, does not wear contacts.   Home Medications Prior to Admission medications   Medication Sig Start Date End Date Taking? Authorizing Provider  albuterol (VENTOLIN HFA) 108 (90 Base) MCG/ACT inhaler albuterol sulfate HFA 90 mcg/actuation aerosol inhaler  INHALE 2 PUFFS EVERY 4 HOURS BY INHALATION ROUTE.    [provider]  Ascorbic Acid (VITAMIN C) 1000 MG tablet Take 1,000 mg by mouth daily.    [provider]  Cholecalciferol (VITAMIN D) 2000 units tablet Take 2,000 Units by mouth daily. When she remembers    [provider]  Famotidine (PEPCID AC PO) Take by mouth. As needed    [provider]  fluticasone (FLONASE) 50 MCG/ACT nasal spray Place 2 sprays into both nostrils daily for 5 days. 07/30/20 08/04/20  Maudie Flakes, MD  gabapentin (NEURONTIN) 100 MG capsule gabapentin 100 mg capsule  TAKE 1 CAPSULE BY MOUTH TWICE A DAY AS NEEDED     [provider]  Garlic 814 MG TBEC Take by mouth. 2-3 times per week    [provider]  hydrochlorothiazide (HYDRODIURIL) 12.5 MG tablet TAKE 1 TABLET BY MOUTH EVERY DAY 10/18/20   Elvia Collum M, DO  levothyroxine (SYNTHROID) 25 MCG tablet levothyroxine 25 mcg tablet    [provider]  lisinopril (ZESTRIL) 5 MG tablet lisinopril 5 mg tablet    [provider]  loratadine (CLARITIN) 10 MG tablet Take 10 mg by mouth daily as needed for allergies. Patient not taking: Reported on 07/19/2021    [provider]  Melatonin 10 MG TABS Take 10 mg by mouth daily as needed (sleep). Patient not taking: Reported on 07/19/2021    [provider]  meloxicam (MOBIC) 7.5 MG tablet TAKE 1 TABLET BY MOUTH EVERY DAY AS NEEDED FOR PAIN Patient not taking: Reported on 07/19/2021 11/20/19   Mikey Kirschner, MD  psyllium (METAMUCIL) 58.6 % packet Take 1 packet by mouth. 1-3 times per week    [provider]  TURMERIC PO Take 2.5 mLs by mouth daily. Liquid  1-2 times per week    [provider]      Allergies    Verapamil    Review of Systems   Review of Systems  Eyes:  Positive for pain, redness, itching and visual disturbance.  Neurological:  Negative for dizziness and headaches.  All other systems  reviewed and are negative.  Physical Exam Updated Vital Signs BP (!) 154/105 (BP Location: Right Arm)    Pulse 76    Temp 98 F (36.7 C) (Oral)    Resp 20    Wt 86.6 kg    SpO2 96%    BMI 30.60 kg/m  Physical Exam Vitals and nursing Kayla [name] reviewed.  Constitutional:      General: She is not in acute distress.    Appearance: Normal appearance.  HENT:     Head: Normocephalic.  Eyes:     General: Lids are everted, no foreign bodies appreciated. Vision grossly intact.        Right eye: Hordeolum present.     Extraocular Movements: Extraocular movements intact.     Right eye: Normal extraocular motion.     Left eye: Normal extraocular  motion.     Conjunctiva/sclera:     Right eye: Right conjunctiva is injected. No chemosis or exudate.    Pupils: Pupils are equal, round, and reactive to light.     Comments: 2 small internal hordeolum to the lateral lower lid.  Moderate scleral injection noted.  No chemosis no foreign bodies on exam.  Neck:     Thyroid: No thyromegaly.     Meningeal: Kernig's sign absent.  Cardiovascular:     Rate and Rhythm: Normal rate and regular rhythm.     Pulses: Normal pulses.  Pulmonary:     Effort: Pulmonary effort is normal.     Breath sounds: Normal breath sounds. No wheezing.  Abdominal:     Palpations: Abdomen is soft.     Tenderness: There is no abdominal tenderness. There is no guarding or rebound.  Musculoskeletal:        General: Normal range of motion.     Cervical back: Full passive range of motion without pain and normal range of motion.  Skin:    General: Skin is warm.     Capillary Refill: Capillary refill takes less than 2 seconds.     Findings: No rash.  Neurological:     General: No focal deficit present.     Mental Status: She is alert.     Sensory: No sensory deficit.     Motor: No weakness.    ED Results / Procedures / Treatments   Labs (all labs ordered are listed, but only abnormal results are displayed) Labs Reviewed - No data to display  EKG None  Radiology No results found.  Procedures Procedures  {Document cardiac monitor, telemetry assessment procedure when appropriate:1}  Medications Ordered in ED Medications - No data to display  ED Course/ Medical Decision Making/ A&P                           Medical Decision Making  ***  {Document critical care time when appropriate:1} {Document review of labs and clinical decision tools ie heart score, Chads2Vasc2 etc:1}  {Document your independent review of radiology images, and any outside records:1} {Document your discussion with family members, caretakers, and with consultants:1} {Document  social determinants of health affecting pt's care:1} {Document your decision making why or why not admission, treatments were needed:1} Final Clinical Impression(s) / ED Diagnoses Final diagnoses:  None    Rx / DC Orders ED Discharge Orders     None

## 2021-09-20 NOTE — ED Provider Notes (Signed)
[facility] EMERGENCY DEPARTMENT Provider Note   CSN: [id number] Arrival date & time: 09/20/21  1056     History  Chief Complaint  Patient presents with   Eye Pain   eye redness    Kayla [name] is a 76 y.o. female.   Eye Pain Pertinent negatives include no headaches.      Kayla [name] is a 76 y.o. female with past medical history of hypertension IBS and asthma who presents to the Emergency Department complaining of redness, itching and foreign body sensation of the right eye.  She states that she was cleaning out a house that was very dusty and dirty on Saturday.  Noticed irritation of the right eye later that evening.  She was seen by PCP yesterday.  She was prescribed polymyxin drops to use in her eye.  Since using the drops, she reports increased discomfort of her eye and increased redness.  No visual deficits.  She denies dizziness or headache as well.  She does not recall any injury to her eye.  She wears glasses for reading, does not wear contacts.    Home Medications Prior to Admission medications   Medication Sig Start Date End Date Taking? Authorizing Provider  albuterol (VENTOLIN HFA) 108 (90 Base) MCG/ACT inhaler albuterol sulfate HFA 90 mcg/actuation aerosol inhaler  INHALE 2 PUFFS EVERY 4 HOURS BY INHALATION ROUTE.    [provider]  Ascorbic Acid (VITAMIN C) 1000 MG tablet Take 1,000 mg by mouth daily.    [provider]  Cholecalciferol (VITAMIN D) 2000 units tablet Take 2,000 Units by mouth daily. When she remembers    [provider]  Famotidine (PEPCID AC PO) Take by mouth. As needed    [provider]  fluticasone (FLONASE) 50 MCG/ACT nasal spray Place 2 sprays into both nostrils daily for 5 days. 07/30/20 08/04/20  Maudie Flakes, MD  gabapentin (NEURONTIN) 100 MG capsule gabapentin 100 mg capsule  TAKE 1 CAPSULE BY MOUTH TWICE A DAY AS NEEDED    [provider]  Garlic 416 MG TBEC Take by mouth. 2-3 times per  week    [provider]  hydrochlorothiazide (HYDRODIURIL) 12.5 MG tablet TAKE 1 TABLET BY MOUTH EVERY DAY 10/18/20   Elvia Collum M, DO  levothyroxine (SYNTHROID) 25 MCG tablet levothyroxine 25 mcg tablet    [provider]  lisinopril (ZESTRIL) 5 MG tablet lisinopril 5 mg tablet    [provider]  loratadine (CLARITIN) 10 MG tablet Take 10 mg by mouth daily as needed for allergies. Patient not taking: Reported on 07/19/2021    [provider]  Melatonin 10 MG TABS Take 10 mg by mouth daily as needed (sleep). Patient not taking: Reported on 07/19/2021    [provider]  meloxicam (MOBIC) 7.5 MG tablet TAKE 1 TABLET BY MOUTH EVERY DAY AS NEEDED FOR PAIN Patient not taking: Reported on 07/19/2021 11/20/19   Mikey Kirschner, MD  psyllium (METAMUCIL) 58.6 % packet Take 1 packet by mouth. 1-3 times per week    [provider]  TURMERIC PO Take 2.5 mLs by mouth daily. Liquid  1-2 times per week    [provider]      Allergies    Verapamil    Review of Systems   Review of Systems  Eyes:  Positive for pain, redness and itching. Negative for photophobia and visual disturbance.  Neurological:  Negative for dizziness and headaches.   Physical Exam Updated Vital Signs BP Marland Kitchen)  154/105 (BP Location: Right Arm)    Pulse 76    Temp 98 F (36.7 C) (Oral)    Resp 20    Wt 86.6 kg    SpO2 96%    BMI 30.60 kg/m  Physical Exam Vitals and nursing note reviewed.  Constitutional:      Appearance: Normal appearance.  HENT:     Head: Normocephalic.  Eyes:     General: Lids are everted, no foreign bodies appreciated. Vision grossly intact. Gaze aligned appropriately.        Right eye: No foreign body.        Left eye: No foreign body.     Extraocular Movements: Extraocular movements intact.     Right eye: Normal extraocular motion.     Left eye: Normal extraocular motion.     Conjunctiva/sclera:     Right eye: Right conjunctiva is  injected. No exudate.    Pupils: Pupils are equal, round, and reactive to light.     Right eye: Corneal abrasion and fluorescein uptake present.     Funduscopic exam:    Right eye: No exudate or papilledema.     Slit lamp exam:    Right eye: No corneal flare, corneal ulcer, foreign body or hyphema.     Comments: 2 small pustules to the conjunctival surface of lateral lower lid.  Moderate scleral injection noted.  Several pinpoint areas of fluorescein uptake along the lower cornea no chemosis, no foreign bodies on exam.  No periorbital erythema or edema  Cardiovascular:     Rate and Rhythm: Normal rate and regular rhythm.     Pulses: Normal pulses.  Pulmonary:     Effort: Pulmonary effort is normal.     Breath sounds: Normal breath sounds. No wheezing.  Musculoskeletal:        General: Normal range of motion.     Cervical back: Full passive range of motion without pain and normal range of motion.  Skin:    General: Skin is warm.     Findings: No rash.  Neurological:     General: No focal deficit present.     Mental Status: She is alert.     Sensory: No sensory deficit.     Motor: No weakness.    ED Results / Procedures / Treatments   Labs (all labs ordered are listed, but only abnormal results are displayed) Labs Reviewed - No data to display  EKG None  Radiology No results found.  Procedures Procedures    Medications Ordered in ED Medications  tetracaine (PONTOCAINE) 0.5 % ophthalmic solution 2 drop (has no administration in time range)  fluorescein ophthalmic strip 1 strip (has no administration in time range)    ED Course/ Medical Decision Making/ A&P                            Visual Acuity Bilateral Distance: 20/25  R Distance: 20/30  L Distance: 20/40   Medical Decision Making Patient here with foreign body sensation to lower right eye x4 days.  Symptoms began after cleaning out a house that she states was dusty and dirty.  She does not recall injury or  trauma to the eye.  She has been rubbing at her eyes.  Seen by PCP yesterday and started on polymyxin eyedrops.  Here today because symptoms are worse since starting the eyedrops.  She endorses increased redness and irritation of her eye.  She denies any visual  changes headaches or dizziness.  Risk Prescription drug management.   Patient does have 2 small internal hordeolum to the lateral aspect of the scleral side of the right lower lid.  She also has several pinpoint areas of fluorescein uptake along the lower aspect of the cornea.  Likely abrasion.  No foreign body seen on exam.  Her visual acuity is reassuring  She was seen by her PCP yesterday and started on polymyxin drops.  Her symptoms have worsened since using them.  This may be secondary to intolerance of the medication.  We will have her discontinue, dispensed tobramycin drops to use and she is agreeable to warm compresses.  Recommend that she follow-up with ophthalmology, she is requesting local follow-up as it is difficult for her to drive to [facility]. I feel she is appropriate for discharge home, she is agreeable to plan.  Return precautions discussed            Final Clinical Impression(s) / ED Diagnoses Final diagnoses:  Abrasion of right cornea, initial encounter  Hordeolum internum of right lower eyelid    Rx / DC Orders ED Discharge Orders     None         Kem Parkinson, PA-C 09/20/21 1303    Elnora Morrison, MD 09/24/21 2355

## 2021-09-20 NOTE — Discharge Instructions (Signed)
Stop using the polymyxin drops.  Apply warm wet compresses on and off to your eyes 2-3 times a day as needed.  Apply 1 to 2 drops of the tobramycin to the right eye every 4 hours for 5 days.  I have listed follow-up information for an ophthalmologist in [place].  I have also provided information for an optometrist that is here in [place] that you may follow-up with if this is easier for you.  Please return to the emergency department for any new or worsening symptoms.

## 2021-09-20 NOTE — ED Triage Notes (Signed)
Right eye pain and redness since Saturday when pt was cleaning out a house. Cannot relate trauma or foreign body to condition.

## 2021-10-25 DIAGNOSIS — R739 Hyperglycemia, unspecified: Secondary | ICD-10-CM | POA: Diagnosis not present

## 2021-10-25 DIAGNOSIS — R809 Proteinuria, unspecified: Secondary | ICD-10-CM | POA: Diagnosis not present

## 2021-10-25 DIAGNOSIS — I10 Essential (primary) hypertension: Secondary | ICD-10-CM | POA: Diagnosis not present

## 2021-10-25 DIAGNOSIS — E039 Hypothyroidism, unspecified: Secondary | ICD-10-CM | POA: Diagnosis not present

## 2021-11-01 DIAGNOSIS — E782 Mixed hyperlipidemia: Secondary | ICD-10-CM | POA: Diagnosis not present

## 2021-11-01 DIAGNOSIS — R809 Proteinuria, unspecified: Secondary | ICD-10-CM | POA: Diagnosis not present

## 2021-11-01 DIAGNOSIS — I10 Essential (primary) hypertension: Secondary | ICD-10-CM | POA: Diagnosis not present

## 2021-11-01 DIAGNOSIS — E039 Hypothyroidism, unspecified: Secondary | ICD-10-CM | POA: Diagnosis not present

## 2021-11-01 DIAGNOSIS — R739 Hyperglycemia, unspecified: Secondary | ICD-10-CM | POA: Diagnosis not present

## 2021-11-01 DIAGNOSIS — C679 Malignant neoplasm of bladder, unspecified: Secondary | ICD-10-CM | POA: Diagnosis not present

## 2021-11-01 DIAGNOSIS — B372 Candidiasis of skin and nail: Secondary | ICD-10-CM | POA: Diagnosis not present

## 2021-11-22 ENCOUNTER — Ambulatory Visit: Payer: Medicare HMO | Admitting: Urology

## 2021-11-22 VITALS — BP 138/85 | HR 71

## 2021-11-22 DIAGNOSIS — C672 Malignant neoplasm of lateral wall of bladder: Secondary | ICD-10-CM

## 2021-11-22 LAB — URINALYSIS, ROUTINE W REFLEX MICROSCOPIC
Bilirubin, UA: NEGATIVE
Glucose, UA: NEGATIVE
Ketones, UA: NEGATIVE
Leukocytes,UA: NEGATIVE
Nitrite, UA: NEGATIVE
Protein,UA: NEGATIVE
RBC, UA: NEGATIVE
Specific Gravity, UA: 1.025 (ref 1.005–1.030)
Urobilinogen, Ur: 0.2 mg/dL (ref 0.2–1.0)
pH, UA: 7 (ref 5.0–7.5)

## 2021-11-22 MED ORDER — CIPROFLOXACIN HCL 500 MG PO TABS: 500.0000 mg | ORAL_TABLET | Freq: Once | ORAL | Status: DC

## 2021-11-22 NOTE — Progress Notes (Signed)
? ?  11/22/21 ? ?CC: Followup bladder cancer ? ? ?HPI: ?Kayla [name] is a 76yo here for followup for bladder cancer ? ?Blood pressure 138/85, pulse 71. ?NED. A&Ox3.   ?No respiratory distress   ?Abd soft, NT, ND ?Normal external genitalia with patent urethral meatus ? ?Cystoscopy Procedure Note ? ?Patient identification was confirmed, informed consent was obtained, and patient was prepped using Betadine solution.  Lidocaine jelly was administered per urethral meatus.   ? ?Procedure: ?- Flexible cystoscope introduced, without any difficulty.   ?- Thorough search of the bladder revealed: ?   normal urethral meatus ?   normal urothelium ?   no stones ?   no ulcers  ?   no tumors ?   no urethral polyps ?   no trabeculation ? ?- Ureteral orifices were normal in position and appearance. ? ?Post-Procedure: ?- Patient tolerated the procedure well ? ?Assessment/ Plan: ? ? ? Return in about 1 year (around 11/23/2022) for cystoscopy. ? ?Nicolette Bang, MD  ?

## 2021-11-23 NOTE — Patient Instructions (Signed)

## 2022-01-06 DIAGNOSIS — M25561 Pain in right knee: Secondary | ICD-10-CM | POA: Diagnosis not present

## 2022-01-06 DIAGNOSIS — M25551 Pain in right hip: Secondary | ICD-10-CM | POA: Diagnosis not present

## 2022-01-08 ENCOUNTER — Other Ambulatory Visit (HOSPITAL_COMMUNITY): Payer: Self-pay | Admitting: Family Medicine

## 2022-01-08 ENCOUNTER — Ambulatory Visit (HOSPITAL_COMMUNITY)
Admission: RE | Admit: 2022-01-08 | Discharge: 2022-01-08 | Disposition: A | Discharge status: Home / Self Care | Payer: Medicare HMO | Source: Ambulatory Visit | Attending: Family Medicine | Admitting: Family Medicine

## 2022-01-08 DIAGNOSIS — M25561 Pain in right knee: Secondary | ICD-10-CM

## 2022-01-08 DIAGNOSIS — M25551 Pain in right hip: Secondary | ICD-10-CM

## 2022-01-08 DIAGNOSIS — S72041A Displaced fracture of base of neck of right femur, initial encounter for closed fracture: Secondary | ICD-10-CM | POA: Diagnosis not present

## 2022-01-08 DIAGNOSIS — M1711 Unilateral primary osteoarthritis, right knee: Secondary | ICD-10-CM | POA: Diagnosis not present

## 2022-01-08 DIAGNOSIS — I878 Other specified disorders of veins: Secondary | ICD-10-CM | POA: Diagnosis not present

## 2022-01-08 DIAGNOSIS — S72011A Unspecified intracapsular fracture of right femur, initial encounter for closed fracture: Secondary | ICD-10-CM | POA: Diagnosis not present

## 2022-01-08 DIAGNOSIS — M25751 Osteophyte, right hip: Secondary | ICD-10-CM | POA: Diagnosis not present

## 2022-01-18 ENCOUNTER — Encounter (HOSPITAL_COMMUNITY): Payer: Self-pay | Admitting: Orthopedic Surgery

## 2022-01-18 ENCOUNTER — Other Ambulatory Visit: Payer: Self-pay

## 2022-01-18 DIAGNOSIS — M25551 Pain in right hip: Secondary | ICD-10-CM | POA: Diagnosis not present

## 2022-01-18 NOTE — Progress Notes (Signed)
For Short Stay: COVID SWAB appointment date: N/A Date of COVID positive in last 71 days:N/A  Bowel Prep reminder: N/A   For Anesthesia: PCP - Celene Squibb, MD Cardiologist - Rothbart, Cristopher Estimable, MD past history not a current patient   Chest x-ray - greater than 1 year EKG - greater than 1 year Stress Test -  greater than 2 years ECHO - greater than 2 years Cardiac Cath - N/A Pacemaker/ICD device last checked: N/A Pacemaker orders received:N/A Device Rep notified: N/A  Spinal Cord Stimulator: N/A  Sleep Study -   N/A CPAP - N/A  Fasting Blood Sugar - N/A Checks Blood Sugar ___N/A__ times a day Date and result of last Hgb A1c-N/A  Blood Thinner Instructions: N/A Aspirin Instructions:N/A Last Dose:N/A  Activity level: Can go up a flight of stairs and activities of daily living without stopping and without chest pain and/or shortness of breath   Anesthesia review: N/A  Patient denies shortness of breath, fever, cough and chest pain at PAT appointment   Patient verbalized understanding of instructions that were given to them at the PAT appointment. Patient was also instructed that they will need to review over the PAT instructions again at home before surgery.

## 2022-01-19 ENCOUNTER — Inpatient Hospital Stay (HOSPITAL_COMMUNITY): Payer: Medicare HMO

## 2022-01-19 ENCOUNTER — Inpatient Hospital Stay (HOSPITAL_COMMUNITY): Payer: Medicare HMO | Admitting: Anesthesiology

## 2022-01-19 ENCOUNTER — Other Ambulatory Visit: Payer: Self-pay | Admitting: Orthopedic Surgery

## 2022-01-19 ENCOUNTER — Encounter (HOSPITAL_COMMUNITY)
Admission: RE | Disposition: A | Discharge status: Home / Self Care | Payer: Self-pay | Source: Home / Self Care | Attending: Orthopedic Surgery

## 2022-01-19 ENCOUNTER — Encounter (HOSPITAL_COMMUNITY): Payer: Self-pay | Admitting: Orthopedic Surgery

## 2022-01-19 ENCOUNTER — Inpatient Hospital Stay (HOSPITAL_COMMUNITY)
Admission: RE | Admit: 2022-01-19 | Discharge: 2022-01-20 | DRG: 482 | Disposition: A | Discharge status: Home / Self Care | Payer: Medicare HMO | Attending: Orthopedic Surgery | Admitting: Orthopedic Surgery

## 2022-01-19 DIAGNOSIS — Z86011 Personal history of benign neoplasm of the brain: Secondary | ICD-10-CM | POA: Diagnosis not present

## 2022-01-19 DIAGNOSIS — Z8551 Personal history of malignant neoplasm of bladder: Secondary | ICD-10-CM | POA: Diagnosis not present

## 2022-01-19 DIAGNOSIS — M1990 Unspecified osteoarthritis, unspecified site: Secondary | ICD-10-CM | POA: Diagnosis present

## 2022-01-19 DIAGNOSIS — E039 Hypothyroidism, unspecified: Secondary | ICD-10-CM | POA: Diagnosis present

## 2022-01-19 DIAGNOSIS — W3189XA Contact with other specified machinery, initial encounter: Secondary | ICD-10-CM

## 2022-01-19 DIAGNOSIS — K219 Gastro-esophageal reflux disease without esophagitis: Secondary | ICD-10-CM | POA: Diagnosis not present

## 2022-01-19 DIAGNOSIS — J45909 Unspecified asthma, uncomplicated: Secondary | ICD-10-CM | POA: Diagnosis present

## 2022-01-19 DIAGNOSIS — I10 Essential (primary) hypertension: Secondary | ICD-10-CM | POA: Diagnosis present

## 2022-01-19 DIAGNOSIS — S72041A Displaced fracture of base of neck of right femur, initial encounter for closed fracture: Secondary | ICD-10-CM | POA: Diagnosis not present

## 2022-01-19 DIAGNOSIS — Z01818 Encounter for other preprocedural examination: Principal | ICD-10-CM

## 2022-01-19 DIAGNOSIS — S72001A Fracture of unspecified part of neck of right femur, initial encounter for closed fracture: Secondary | ICD-10-CM | POA: Diagnosis not present

## 2022-01-19 HISTORY — PX: HIP PINNING,CANNULATED: SHX1758

## 2022-01-19 HISTORY — DX: Personal history of malignant neoplasm of bladder: Z85.51

## 2022-01-19 HISTORY — DX: Hypothyroidism, unspecified: E03.9

## 2022-01-19 HISTORY — DX: Personal history of other diseases of the nervous system and sense organs: Z86.69

## 2022-01-19 LAB — CBC
HCT: 44.7 % (ref 36.0–46.0)
Hemoglobin: 14.7 g/dL (ref 12.0–15.0)
MCH: 29.9 pg (ref 26.0–34.0)
MCHC: 32.9 g/dL (ref 30.0–36.0)
MCV: 90.9 fL (ref 80.0–100.0)
Platelets: 215 10*3/uL (ref 150–400)
RBC: 4.92 MIL/uL (ref 3.87–5.11)
RDW: 12.1 % (ref 11.5–15.5)
WBC: 6.5 10*3/uL (ref 4.0–10.5)
nRBC: 0 % (ref 0.0–0.2)

## 2022-01-19 LAB — BASIC METABOLIC PANEL
Anion gap: 6 (ref 5–15)
BUN: 19 mg/dL (ref 8–23)
CO2: 28 mmol/L (ref 22–32)
Calcium: 9.3 mg/dL (ref 8.9–10.3)
Chloride: 104 mmol/L (ref 98–111)
Creatinine, Ser: 0.84 mg/dL (ref 0.44–1.00)
GFR, Estimated: >60 mL/min (ref >60)
Glucose, Bld: 102 mg/dL — ABNORMAL HIGH (ref 70–99)
Potassium: 4 mmol/L (ref 3.5–5.1)
Sodium: 138 mmol/L (ref 135–145)

## 2022-01-19 LAB — ABO/RH: ABO/RH(D): O POS

## 2022-01-19 LAB — TYPE AND SCREEN
ABO/RH(D): O POS
Antibody Screen: NEGATIVE

## 2022-01-19 SURGERY — FIXATION, FEMUR, NECK, PERCUTANEOUS, USING SCREW
Anesthesia: Monitor Anesthesia Care | Site: Hip | Laterality: Right

## 2022-01-19 MED ORDER — ONDANSETRON HCL 4 MG PO TABS: 4.0000 mg | ORAL_TABLET | Freq: Three times a day (TID) | ORAL | 0 refills | Status: AC | PRN

## 2022-01-19 MED ORDER — CHLORHEXIDINE GLUCONATE 0.12 % MT SOLN
15.0000 mL | Freq: Once | OROMUCOSAL | Status: AC
  Administered 2022-01-19: 15 mL via OROMUCOSAL

## 2022-01-19 MED ORDER — ONDANSETRON HCL 4 MG/2ML IJ SOLN
INTRAMUSCULAR | Status: DC | PRN
  Administered 2022-01-19: 4 mg via INTRAVENOUS

## 2022-01-19 MED ORDER — ONDANSETRON HCL 4 MG/2ML IJ SOLN: 4.0000 mg | Freq: Four times a day (QID) | INTRAMUSCULAR | Status: DC | PRN

## 2022-01-19 MED ORDER — ISOPROPYL ALCOHOL 70 % SOLN
Status: DC | PRN
  Administered 2022-01-19: 1 via TOPICAL

## 2022-01-19 MED ORDER — FENTANYL CITRATE (PF) 100 MCG/2ML IJ SOLN
INTRAMUSCULAR | Status: DC | PRN
  Administered 2022-01-19: 100 ug via INTRAVENOUS

## 2022-01-19 MED ORDER — ACETAMINOPHEN 500 MG PO TABS
1000.0000 mg | ORAL_TABLET | Freq: Once | ORAL | Status: AC
  Administered 2022-01-19: 1000 mg via ORAL
  Filled 2022-01-19: qty 2

## 2022-01-19 MED ORDER — FENTANYL CITRATE PF 50 MCG/ML IJ SOSY
PREFILLED_SYRINGE | INTRAMUSCULAR | Status: AC
  Filled 2022-01-19: qty 1

## 2022-01-19 MED ORDER — CHLORHEXIDINE GLUCONATE 4 % EX LIQD: 60.0000 mL | Freq: Once | CUTANEOUS | Status: DC

## 2022-01-19 MED ORDER — LACTATED RINGERS IV SOLN: INTRAVENOUS | Status: DC

## 2022-01-19 MED ORDER — DIPHENHYDRAMINE HCL 12.5 MG/5ML PO ELIX: 12.5000 mg | ORAL_SOLUTION | ORAL | Status: DC | PRN

## 2022-01-19 MED ORDER — ONDANSETRON HCL 4 MG PO TABS: 4.0000 mg | ORAL_TABLET | Freq: Four times a day (QID) | ORAL | Status: DC | PRN

## 2022-01-19 MED ORDER — MENTHOL 3 MG MT LOZG: 1.0000 | LOZENGE | OROMUCOSAL | Status: DC | PRN

## 2022-01-19 MED ORDER — PROPOFOL 500 MG/50ML IV EMUL
INTRAVENOUS | Status: DC | PRN
  Administered 2022-01-19: 75 ug/kg/min via INTRAVENOUS

## 2022-01-19 MED ORDER — HYDROMORPHONE HCL 1 MG/ML IJ SOLN
0.5000 mg | INTRAMUSCULAR | Status: DC | PRN
  Administered 2022-01-19: 1 mg via INTRAVENOUS
  Filled 2022-01-19: qty 1

## 2022-01-19 MED ORDER — DEXAMETHASONE SODIUM PHOSPHATE 10 MG/ML IJ SOLN
INTRAMUSCULAR | Status: DC | PRN
  Administered 2022-01-19: 5 mg via INTRAVENOUS

## 2022-01-19 MED ORDER — ACETAMINOPHEN 500 MG PO TABS
1000.0000 mg | ORAL_TABLET | Freq: Four times a day (QID) | ORAL | Status: DC
  Administered 2022-01-19 – 2022-01-20 (×3): 1000 mg via ORAL
  Filled 2022-01-19 (×3): qty 2

## 2022-01-19 MED ORDER — FENTANYL CITRATE PF 50 MCG/ML IJ SOSY
25.0000 ug | PREFILLED_SYRINGE | INTRAMUSCULAR | Status: DC | PRN
  Administered 2022-01-19 (×2): 25 ug via INTRAVENOUS

## 2022-01-19 MED ORDER — POLYETHYLENE GLYCOL 3350 17 G PO PACK: 17.0000 g | PACK | Freq: Every day | ORAL | Status: DC | PRN

## 2022-01-19 MED ORDER — OXYCODONE HCL 5 MG PO TABS
2.5000 mg | ORAL_TABLET | ORAL | Status: DC | PRN
  Administered 2022-01-20 (×2): 5 mg via ORAL
  Filled 2022-01-19 (×2): qty 1

## 2022-01-19 MED ORDER — FENTANYL CITRATE (PF) 100 MCG/2ML IJ SOLN
INTRAMUSCULAR | Status: AC
  Filled 2022-01-19: qty 2

## 2022-01-19 MED ORDER — CEFAZOLIN SODIUM-DEXTROSE 2-4 GM/100ML-% IV SOLN
2.0000 g | Freq: Four times a day (QID) | INTRAVENOUS | Status: AC
  Administered 2022-01-19 – 2022-01-20 (×2): 2 g via INTRAVENOUS
  Filled 2022-01-19 (×2): qty 100

## 2022-01-19 MED ORDER — HYDROMORPHONE HCL 1 MG/ML IJ SOLN
INTRAMUSCULAR | Status: AC
  Administered 2022-01-20: 1 mg
  Filled 2022-01-19: qty 1

## 2022-01-19 MED ORDER — 0.9 % SODIUM CHLORIDE (POUR BTL) OPTIME
TOPICAL | Status: DC | PRN
  Administered 2022-01-19: 1000 mL

## 2022-01-19 MED ORDER — PANTOPRAZOLE SODIUM 40 MG PO TBEC
40.0000 mg | DELAYED_RELEASE_TABLET | Freq: Every day | ORAL | Status: DC
  Administered 2022-01-20: 40 mg via ORAL
  Filled 2022-01-19: qty 1

## 2022-01-19 MED ORDER — ASPIRIN 81 MG PO CHEW
81.0000 mg | CHEWABLE_TABLET | Freq: Two times a day (BID) | ORAL | Status: DC
  Administered 2022-01-20: 81 mg via ORAL
  Filled 2022-01-19: qty 1

## 2022-01-19 MED ORDER — PHENYLEPHRINE HCL (PRESSORS) 10 MG/ML IV SOLN
INTRAVENOUS | Status: DC | PRN
  Administered 2022-01-19: 80 ug via INTRAVENOUS
  Administered 2022-01-19: 160 ug via INTRAVENOUS
  Administered 2022-01-19 (×3): 80 ug via INTRAVENOUS

## 2022-01-19 MED ORDER — OXYCODONE HCL 5 MG/5ML PO SOLN: 5.0000 mg | Freq: Once | ORAL | Status: DC | PRN

## 2022-01-19 MED ORDER — TRANEXAMIC ACID-NACL 1000-0.7 MG/100ML-% IV SOLN
1000.0000 mg | INTRAVENOUS | Status: AC
  Administered 2022-01-19: 1000 mg via INTRAVENOUS
  Filled 2022-01-19: qty 100

## 2022-01-19 MED ORDER — ORAL CARE MOUTH RINSE: 15.0000 mL | Freq: Once | OROMUCOSAL | Status: AC

## 2022-01-19 MED ORDER — KETOROLAC TROMETHAMINE 15 MG/ML IJ SOLN
7.5000 mg | Freq: Four times a day (QID) | INTRAMUSCULAR | Status: DC
  Administered 2022-01-19 – 2022-01-20 (×3): 7.5 mg via INTRAVENOUS
  Filled 2022-01-19 (×3): qty 1

## 2022-01-19 MED ORDER — OXYCODONE HCL 5 MG PO TABS: 2.5000 mg | ORAL_TABLET | Freq: Four times a day (QID) | ORAL | 0 refills | Status: AC | PRN

## 2022-01-19 MED ORDER — PHENOL 1.4 % MT LIQD: 1.0000 | OROMUCOSAL | Status: DC | PRN

## 2022-01-19 MED ORDER — BUPIVACAINE IN DEXTROSE 0.75-8.25 % IT SOLN
INTRATHECAL | Status: DC | PRN
  Administered 2022-01-19: 1.6 mL via INTRATHECAL

## 2022-01-19 MED ORDER — OXYCODONE HCL 5 MG PO TABS: 5.0000 mg | ORAL_TABLET | Freq: Once | ORAL | Status: DC | PRN

## 2022-01-19 MED ORDER — SENNA 8.6 MG PO TABS
1.0000 | ORAL_TABLET | Freq: Two times a day (BID) | ORAL | Status: DC
  Administered 2022-01-19 – 2022-01-20 (×2): 8.6 mg via ORAL
  Filled 2022-01-19 (×2): qty 1

## 2022-01-19 MED ORDER — POVIDONE-IODINE 10 % EX SWAB
2.0000 | Freq: Once | CUTANEOUS | Status: AC
  Administered 2022-01-19: 2 via TOPICAL

## 2022-01-19 MED ORDER — ASPIRIN 81 MG PO TBEC: 81.0000 mg | DELAYED_RELEASE_TABLET | Freq: Two times a day (BID) | ORAL | 0 refills | Status: AC

## 2022-01-19 MED ORDER — CEFAZOLIN SODIUM-DEXTROSE 2-4 GM/100ML-% IV SOLN
2.0000 g | INTRAVENOUS | Status: AC
  Administered 2022-01-19: 2 g via INTRAVENOUS
  Filled 2022-01-19: qty 100

## 2022-01-19 MED ORDER — DOCUSATE SODIUM 100 MG PO CAPS
100.0000 mg | ORAL_CAPSULE | Freq: Two times a day (BID) | ORAL | Status: DC
  Administered 2022-01-19 – 2022-01-20 (×2): 100 mg via ORAL
  Filled 2022-01-19 (×2): qty 1

## 2022-01-19 SURGICAL SUPPLY — 42 items
BAG COUNTER SPONGE SURGICOUNT (BAG) ×2 IMPLANT
BIT DRILL 4.9 CANNULATED (BIT) ×1
BIT DRILL CANN QC 4.9 LRG (BIT) IMPLANT
BRUSH SCRUB EZ PLAIN DRY (MISCELLANEOUS) ×2 IMPLANT
CHLORAPREP W/TINT 26 (MISCELLANEOUS) ×2 IMPLANT
COVER SURGICAL LIGHT HANDLE (MISCELLANEOUS) ×2 IMPLANT
DERMABOND ADVANCED (GAUZE/BANDAGES/DRESSINGS) ×1
DERMABOND ADVANCED .7 DNX12 (GAUZE/BANDAGES/DRESSINGS) IMPLANT
DRAPE C-ARMOR (DRAPES) ×2 IMPLANT
DRAPE STERI IOBAN 125X83 (DRAPES) ×2 IMPLANT
DRAPE U-SHAPE 47X51 STRL (DRAPES) ×4 IMPLANT
DRILL BIT CANNULATED 4.9 (BIT) ×2
DRSG TEGADERM 6X8 (GAUZE/BANDAGES/DRESSINGS) ×1 IMPLANT
ELECT REM PT RETURN 15FT ADLT (MISCELLANEOUS) ×2 IMPLANT
GAUZE SPONGE 4X4 12PLY STRL (GAUZE/BANDAGES/DRESSINGS) ×1 IMPLANT
GLOVE BIO SURGEON STRL SZ 6.5 (GLOVE) ×6 IMPLANT
GLOVE BIO SURGEON STRL SZ7.5 (GLOVE) ×8 IMPLANT
GLOVE BIOGEL PI IND STRL 6.5 (GLOVE) ×1 IMPLANT
GLOVE BIOGEL PI IND STRL 7.5 (GLOVE) ×1 IMPLANT
GLOVE BIOGEL PI IND STRL 8 (GLOVE) ×1 IMPLANT
GLOVE BIOGEL PI INDICATOR 6.5 (GLOVE) ×1
GLOVE BIOGEL PI INDICATOR 7.5 (GLOVE) ×1
GLOVE BIOGEL PI INDICATOR 8 (GLOVE) ×1
GLOVE SURG ORTHO LTX SZ8 (GLOVE) ×4 IMPLANT
GOWN STRL REUS W/ TWL LRG LVL3 (GOWN DISPOSABLE) ×1 IMPLANT
GOWN STRL REUS W/ TWL XL LVL3 (GOWN DISPOSABLE) ×1 IMPLANT
GOWN STRL REUS W/TWL LRG LVL3 (GOWN DISPOSABLE) ×2
GOWN STRL REUS W/TWL XL LVL3 (GOWN DISPOSABLE) ×2
GUIDEWIRE THRD ASNIS 3.2X300 (WIRE) ×3 IMPLANT
KIT BASIN OR (CUSTOM PROCEDURE TRAY) ×2 IMPLANT
KIT TURNOVER KIT A (KITS) ×2 IMPLANT
MANIFOLD NEPTUNE II (INSTRUMENTS) ×2 IMPLANT
NS IRRIG 1000ML POUR BTL (IV SOLUTION) ×2 IMPLANT
PACK GENERAL/GYN (CUSTOM PROCEDURE TRAY) ×2 IMPLANT
PAD ARMBOARD 7.5X6 YLW CONV (MISCELLANEOUS) ×4 IMPLANT
SCREW ASNIS 90MM (Screw) ×2 IMPLANT
SCREW ASNIS 95MM (Screw) ×1 IMPLANT
SUT MNCRL AB 3-0 PS2 18 (SUTURE) ×2 IMPLANT
SUT VIC AB 2-0 CT1 27 (SUTURE) ×2
SUT VIC AB 2-0 CT1 TAPERPNT 27 (SUTURE) ×1 IMPLANT
TOWEL GREEN STERILE FF (TOWEL DISPOSABLE) ×6 IMPLANT
WATER STERILE IRR 1000ML POUR (IV SOLUTION) ×2 IMPLANT
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
## 2022-01-20 ENCOUNTER — Other Ambulatory Visit: Payer: Self-pay

## 2022-01-20 LAB — BASIC METABOLIC PANEL
Anion gap: 10 (ref 5–15)
BUN: 23 mg/dL (ref 8–23)
CO2: 28 mmol/L (ref 22–32)
Calcium: 9.5 mg/dL (ref 8.9–10.3)
Chloride: 103 mmol/L (ref 98–111)
Creatinine, Ser: 0.93 mg/dL (ref 0.44–1.00)
GFR, Estimated: >60 mL/min (ref >60)
Glucose, Bld: 139 mg/dL — ABNORMAL HIGH (ref 70–99)
Potassium: 4.3 mmol/L (ref 3.5–5.1)
Sodium: 141 mmol/L (ref 135–145)

## 2022-01-20 LAB — GLUCOSE, CAPILLARY
Glucose-Capillary: 131 mg/dL — ABNORMAL HIGH (ref 70–99)
Glucose-Capillary: 119 mg/dL — ABNORMAL HIGH (ref 70–99)

## 2022-01-20 LAB — CBC
HCT: 45.5 % (ref 36.0–46.0)
Hemoglobin: 14.7 g/dL (ref 12.0–15.0)
MCH: 29.6 pg (ref 26.0–34.0)
MCHC: 32.3 g/dL (ref 30.0–36.0)
MCV: 91.5 fL (ref 80.0–100.0)
Platelets: 212 10*3/uL (ref 150–400)
RBC: 4.97 MIL/uL (ref 3.87–5.11)
RDW: 11.9 % (ref 11.5–15.5)
WBC: 9.8 10*3/uL (ref 4.0–10.5)
nRBC: 0 % (ref 0.0–0.2)
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
## 2022-01-22 ENCOUNTER — Other Ambulatory Visit: Payer: Self-pay

## 2022-01-22 ENCOUNTER — Encounter (HOSPITAL_COMMUNITY): Payer: Self-pay | Admitting: Orthopedic Surgery

## 2022-01-26 DIAGNOSIS — E039 Hypothyroidism, unspecified: Secondary | ICD-10-CM | POA: Diagnosis not present

## 2022-01-26 DIAGNOSIS — E782 Mixed hyperlipidemia: Secondary | ICD-10-CM | POA: Diagnosis not present

## 2022-01-26 DIAGNOSIS — I10 Essential (primary) hypertension: Secondary | ICD-10-CM | POA: Diagnosis not present

## 2022-01-29 DIAGNOSIS — S72041D Displaced fracture of base of neck of right femur, subsequent encounter for closed fracture with routine healing: Secondary | ICD-10-CM | POA: Diagnosis not present

## 2022-02-05 DIAGNOSIS — S72001A Fracture of unspecified part of neck of right femur, initial encounter for closed fracture: Secondary | ICD-10-CM | POA: Diagnosis not present

## 2022-02-27 DIAGNOSIS — S72041D Displaced fracture of base of neck of right femur, subsequent encounter for closed fracture with routine healing: Secondary | ICD-10-CM | POA: Diagnosis not present

## 2022-03-29 DIAGNOSIS — J019 Acute sinusitis, unspecified: Secondary | ICD-10-CM | POA: Diagnosis not present

## 2022-04-26 DIAGNOSIS — I10 Essential (primary) hypertension: Secondary | ICD-10-CM | POA: Diagnosis not present

## 2022-04-26 DIAGNOSIS — E782 Mixed hyperlipidemia: Secondary | ICD-10-CM | POA: Diagnosis not present

## 2022-04-26 DIAGNOSIS — E039 Hypothyroidism, unspecified: Secondary | ICD-10-CM | POA: Diagnosis not present

## 2022-04-26 DIAGNOSIS — R7301 Impaired fasting glucose: Secondary | ICD-10-CM | POA: Diagnosis not present

## 2022-05-02 ENCOUNTER — Other Ambulatory Visit (HOSPITAL_COMMUNITY): Payer: Self-pay | Admitting: Nurse Practitioner

## 2022-05-02 DIAGNOSIS — E782 Mixed hyperlipidemia: Secondary | ICD-10-CM | POA: Diagnosis not present

## 2022-05-02 DIAGNOSIS — R809 Proteinuria, unspecified: Secondary | ICD-10-CM | POA: Diagnosis not present

## 2022-05-02 DIAGNOSIS — Z1231 Encounter for screening mammogram for malignant neoplasm of breast: Secondary | ICD-10-CM

## 2022-05-02 DIAGNOSIS — Z13820 Encounter for screening for osteoporosis: Secondary | ICD-10-CM

## 2022-05-02 DIAGNOSIS — R7303 Prediabetes: Secondary | ICD-10-CM | POA: Diagnosis not present

## 2022-05-02 DIAGNOSIS — Z23 Encounter for immunization: Secondary | ICD-10-CM | POA: Diagnosis not present

## 2022-05-02 DIAGNOSIS — I10 Essential (primary) hypertension: Secondary | ICD-10-CM | POA: Diagnosis not present

## 2022-05-02 DIAGNOSIS — C679 Malignant neoplasm of bladder, unspecified: Secondary | ICD-10-CM | POA: Diagnosis not present

## 2022-05-02 DIAGNOSIS — Z0000 Encounter for general adult medical examination without abnormal findings: Secondary | ICD-10-CM | POA: Diagnosis not present

## 2022-05-02 DIAGNOSIS — E039 Hypothyroidism, unspecified: Secondary | ICD-10-CM | POA: Diagnosis not present

## 2022-05-24 ENCOUNTER — Other Ambulatory Visit (HOSPITAL_COMMUNITY): Payer: Medicare HMO

## 2022-05-24 ENCOUNTER — Ambulatory Visit (HOSPITAL_COMMUNITY): Payer: Medicare HMO

## 2022-05-28 ENCOUNTER — Ambulatory Visit (HOSPITAL_COMMUNITY)
Admission: RE | Admit: 2022-05-28 | Discharge: 2022-05-28 | Disposition: A | Discharge status: Home / Self Care | Payer: Medicare HMO | Source: Ambulatory Visit | Attending: Nurse Practitioner | Admitting: Nurse Practitioner

## 2022-05-28 ENCOUNTER — Encounter (HOSPITAL_COMMUNITY): Payer: Self-pay

## 2022-05-28 DIAGNOSIS — Z13820 Encounter for screening for osteoporosis: Secondary | ICD-10-CM | POA: Diagnosis not present

## 2022-05-28 DIAGNOSIS — M85852 Other specified disorders of bone density and structure, left thigh: Secondary | ICD-10-CM | POA: Insufficient documentation

## 2022-05-28 DIAGNOSIS — Z1231 Encounter for screening mammogram for malignant neoplasm of breast: Secondary | ICD-10-CM | POA: Insufficient documentation

## 2022-05-28 DIAGNOSIS — Z780 Asymptomatic menopausal state: Secondary | ICD-10-CM | POA: Diagnosis not present

## 2022-06-14 DIAGNOSIS — R300 Dysuria: Secondary | ICD-10-CM | POA: Diagnosis not present

## 2022-06-14 DIAGNOSIS — R2242 Localized swelling, mass and lump, left lower limb: Secondary | ICD-10-CM | POA: Diagnosis not present

## 2022-07-11 ENCOUNTER — Encounter: Payer: Self-pay | Admitting: Hematology

## 2022-07-11 ENCOUNTER — Other Ambulatory Visit: Payer: Self-pay

## 2022-07-11 ENCOUNTER — Inpatient Hospital Stay: Payer: Medicare HMO | Attending: Hematology | Admitting: Hematology

## 2022-07-11 ENCOUNTER — Inpatient Hospital Stay: Payer: Medicare HMO

## 2022-07-11 VITALS — BP 125/76 | HR 78 | Resp 18 | Wt 192.8 lb

## 2022-07-11 DIAGNOSIS — C672 Malignant neoplasm of lateral wall of bladder: Secondary | ICD-10-CM

## 2022-07-11 DIAGNOSIS — D241 Benign neoplasm of right breast: Secondary | ICD-10-CM | POA: Diagnosis not present

## 2022-07-11 DIAGNOSIS — Z79899 Other long term (current) drug therapy: Secondary | ICD-10-CM | POA: Insufficient documentation

## 2022-07-11 LAB — CBC WITH DIFFERENTIAL/PLATELET
Abs Immature Granulocytes: 0.01 10*3/uL (ref 0.00–0.07)
Basophils Absolute: 0.1 10*3/uL (ref 0.0–0.1)
Basophils Relative: 1 %
Eosinophils Absolute: 0.2 10*3/uL (ref 0.0–0.5)
Eosinophils Relative: 3 %
HCT: 43.3 % (ref 36.0–46.0)
Hemoglobin: 14.4 g/dL (ref 12.0–15.0)
Immature Granulocytes: 0 %
Lymphocytes Relative: 37 %
Lymphs Abs: 2.2 10*3/uL (ref 0.7–4.0)
MCH: 29.8 pg (ref 26.0–34.0)
MCHC: 33.3 g/dL (ref 30.0–36.0)
MCV: 89.5 fL (ref 80.0–100.0)
Monocytes Absolute: 0.3 10*3/uL (ref 0.1–1.0)
Monocytes Relative: 5 %
Neutro Abs: 3.3 10*3/uL (ref 1.7–7.7)
Neutrophils Relative %: 54 %
Platelets: 186 10*3/uL (ref 150–400)
RBC: 4.84 MIL/uL (ref 3.87–5.11)
RDW: 12 % (ref 11.5–15.5)
WBC: 6.1 10*3/uL (ref 4.0–10.5)
nRBC: 0 % (ref 0.0–0.2)

## 2022-07-11 LAB — COMPREHENSIVE METABOLIC PANEL
ALT: 23 U/L (ref 0–44)
AST: 20 U/L (ref 15–41)
Albumin: 4.2 g/dL (ref 3.5–5.0)
Alkaline Phosphatase: 55 U/L (ref 38–126)
Anion gap: 9 (ref 5–15)
BUN: 17 mg/dL (ref 8–23)
CO2: 25 mmol/L (ref 22–32)
Calcium: 9.2 mg/dL (ref 8.9–10.3)
Chloride: 103 mmol/L (ref 98–111)
Creatinine, Ser: 0.87 mg/dL (ref 0.44–1.00)
GFR, Estimated: >60 mL/min (ref >60)
Glucose, Bld: 123 mg/dL — ABNORMAL HIGH (ref 70–99)
Potassium: 3.7 mmol/L (ref 3.5–5.1)
Sodium: 137 mmol/L (ref 135–145)
Total Bilirubin: 0.5 mg/dL (ref 0.3–1.2)
Total Protein: 6.7 g/dL (ref 6.5–8.1)

## 2022-07-11 NOTE — Progress Notes (Signed)
Kayla [name], [place] [address]   CLINIC:  Medical Oncology/Hematology  PCP:  Celene Squibb, MD [place] [address] [phone]   REASON FOR VISIT:  Follow-up for nonmuscle invasive bladder cancer  PRIOR THERAPY: Intravesical BCG  NGS Results: Not done  CURRENT THERAPY: Observation  BRIEF ONCOLOGIC HISTORY:  Oncology History  Malignant neoplasm of lateral wall of bladder ([facility])  10/07/2019 Initial Diagnosis   Malignant neoplasm of lateral wall of bladder ([facility])   07/19/2021 Cancer Staging   Staging form: Urinary Bladder, AJCC 8th Edition - Clinical stage from 07/19/2021: Stage I (ycT1, cN0, cM0) - Signed by Derek Jack, MD on 07/19/2021 Stage prefix: Post-therapy WHO/ISUP grade (low/high): High Grade Histologic grading system: 2 grade system     CANCER STAGING: Cancer Staging  Malignant neoplasm of lateral wall of bladder ([facility]) Staging form: Urinary Bladder, AJCC 8th Edition - Clinical stage from 07/19/2021: Stage I (ycT1, cN0, cM0) - Signed by Derek Jack, MD on 07/19/2021    INTERVAL HISTORY:  Kayla [name] 76 y.o. female seen for follow-up of non muscle invasive bladder cancer.  She was started on intravesical BCG around January 2020 and has completed treatments in April 2023.  Her last cystoscopy was in April 2023 by Dr. Alyson Ingles.  She denies any urinary problems including hematuria.    REVIEW OF SYSTEMS:  Review of Systems  Respiratory:  Positive for shortness of breath.   Gastrointestinal:  Positive for constipation and nausea.  Neurological:  Positive for dizziness and headaches.  All other systems reviewed and are negative.    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 Gala Romney: Multiple colonic polyps removed, tubular adenomas.  Next colonoscopy in 5 years   COLONOSCOPY N/A 12/25/2019   five semi-pedunculated polyps in descending colon and IC valve, 3-7 mm in size. Tubular adenomas. 3 year surveillance.    CYSTOSCOPY W/ URETERAL STENT PLACEMENT Bilateral 05/05/2018   Procedure: CYSTOSCOPY WITH BILATERAL RETROGRADE PYELOGRAM/ RIGHT URETERAL STENT PLACEMENT;  Surgeon: Cleon Gustin, MD;  Location: [facility];  Service: Urology;  Laterality: Bilateral;   HIP PINNING,CANNULATED Right 01/19/2022   Procedure: CLOSED REDUCTION PERCUTANEOUS PINNING RIGHT FEMORAL NECK;  Surgeon: Willaim Sheng, MD;  Location: [facility];  Service: Orthopedics;  Laterality: Right;   LUMBAR DISC SURGERY     PARTIAL HYSTERECTOMY     POLYPECTOMY  12/25/2019   Procedure: POLYPECTOMY;  Surgeon: Daneil Dolin, MD;  Location: [facility];  Service: Endoscopy;;  ileocecal valve polyp; descending x2;   TRANSURETHRAL RESECTION OF BLADDER TUMOR N/A 05/05/2018   Procedure: TRANSURETHRAL RESECTION OF BLADDER TUMOR (TURBT);  Surgeon:  McKenzie, Candee Furbish, MD;  Location: [facility];  Service: Urology;  Laterality: N/A;   TRANSURETHRAL RESECTION OF BLADDER TUMOR N/A 06/16/2018   Procedure: TRANSURETHRAL RESECTION OF BLADDER TUMOR (TURBT);  Surgeon: Cleon Gustin, MD;  Location: [facility];  Service: Urology;  Laterality: N/A;     SOCIAL HISTORY:  Social History   Socioeconomic History   Marital status: Married    Spouse name: Not on file   Number of children: 2    Years of education: Not on file   Highest education level: Not on file  Occupational History   Occupation: Technical sales engineer    Comment: Laundry, Pharmacist, community  Tobacco Use   Smoking status: Never   Smokeless tobacco: Never  Vaping Use   Vaping Use: Never used  Substance and Sexual Activity   Alcohol use: Yes    Alcohol/week: 2.0 standard drinks of alcohol    Types: 2 Glasses of wine per week    Comment: occ wine   Drug use: No   Sexual activity: Not Currently    Birth control/protection: Post-menopausal, Surgical  Other Topics Concern   Not on file  Social History Narrative   Exercises regularly   Social Determinants of Health   Financial Resource Strain: Not on file  Food Insecurity: Not on file  Transportation Needs: Not on file  Physical Activity: Not on file  Stress: Not on file  Social Connections: Not on file  Intimate Partner Violence: Not on file    FAMILY HISTORY:  Family History  Problem Relation Age of Onset   Cancer Father        colon cancer; also brother-colon And sister-breast   Heart disease Father    Heart disease Mother        at advanced age   Hypertension Mother    Hyperlipidemia Mother    Osteoporosis Mother    Cancer Brother        liver and colon   Breast cancer Sister    Cancer Sister 97       breast   Breast cancer Sister    Breast cancer Paternal Aunt     CURRENT MEDICATIONS:  Outpatient Encounter Medications as of 07/11/2022  Medication Sig   acetaminophen (TYLENOL) 500 MG tablet Take 500 mg by mouth every 6 (six) hours as needed for moderate pain.   albuterol (VENTOLIN HFA) 108 (90 Base) MCG/ACT inhaler Inhale 2 puffs into the lungs every 4 (four) hours as needed for shortness of breath or wheezing.   COLLAGEN PO Take 1 Scoop by mouth 3 (three) times a week.   GARLIC PO Take 1 tablet by mouth once a week.   hydrochlorothiazide (HYDRODIURIL) 12.5 MG tablet TAKE 1 TABLET BY MOUTH EVERY DAY   hydrocortisone cream 1 % Apply 1  Application topically daily as needed for itching.   levothyroxine (SYNTHROID) 25 MCG tablet Take 25 mcg by mouth daily before breakfast.   lisinopril (ZESTRIL) 5 MG tablet Take 5 mg by mouth daily.   loratadine (CLARITIN) 10 MG tablet Take 10 mg by mouth daily as needed for allergies.   Melatonin 10 MG TABS Take 10 mg by mouth daily as needed (sleep).   meloxicam (MOBIC) 7.5 MG tablet TAKE 1 TABLET BY MOUTH EVERY DAY AS NEEDED FOR PAIN   METAMUCIL FIBER PO Take 2 Scoops by mouth 4 (four) times a week.   Multiple Vitamin (MULTIVITAMIN WITH MINERALS) TABS tablet Take 1 tablet by mouth 2 (two) times a week.  nystatin cream (MYCOSTATIN) Apply 1 Application topically daily as needed (yeast).   [DISCONTINUED] gabapentin (NEURONTIN) 100 MG capsule Take 100 mg by mouth 2 (two) times daily as needed (pain).   Facility-Administered Encounter Medications as of 07/11/2022  Medication   ciprofloxacin (CIPRO) tablet 500 mg    ALLERGIES:  Allergies  Allergen Reactions   Verapamil Swelling    Swelling of leg     PHYSICAL EXAM:  ECOG Performance status: 1  Vitals:   07/11/22 1426  BP: 125/76  Pulse: 78  Resp: 18  SpO2: 98%   Filed Weights   07/11/22 1426  Weight: 192 lb 12.8 oz (87.5 kg)   Physical Exam Vitals reviewed.  Constitutional:      Appearance: Normal appearance.  Cardiovascular:     Rate and Rhythm: Normal rate and regular rhythm.     Heart sounds: Normal heart sounds.  Pulmonary:     Effort: Pulmonary effort is normal.     Breath sounds: Normal breath sounds.  Abdominal:     Palpations: Abdomen is soft.  Neurological:     Mental Status: She is alert.  Psychiatric:        Mood and Affect: Mood normal.        Behavior: Behavior normal.      LABORATORY DATA:  I have reviewed the labs as listed.  CBC    Component Value Date/Time   WBC 6.1 07/11/2022 1510   RBC 4.84 07/11/2022 1510   HGB 14.4 07/11/2022 1510   HCT 43.3 07/11/2022 1510   PLT 186 07/11/2022  1510   MCV 89.5 07/11/2022 1510   MCH 29.8 07/11/2022 1510   MCHC 33.3 07/11/2022 1510   RDW 12.0 07/11/2022 1510   LYMPHSABS 2.2 07/11/2022 1510   MONOABS 0.3 07/11/2022 1510   EOSABS 0.2 07/11/2022 1510   BASOSABS 0.1 07/11/2022 1510      Latest Ref Rng & Units 07/11/2022    3:10 PM 01/20/2022    3:58 AM 01/19/2022    3:30 PM  CMP  Glucose 70 - 99 mg/dL 123  139  102   BUN 8 - 23 mg/dL '17  23  19   '$ Creatinine 0.44 - 1.00 mg/dL 0.87  0.93  0.84   Sodium 135 - 145 mmol/L 137  141  138   Potassium 3.5 - 5.1 mmol/L 3.7  4.3  4.0   Chloride 98 - 111 mmol/L 103  103  104   CO2 22 - 32 mmol/L '25  28  28   '$ Calcium 8.9 - 10.3 mg/dL 9.2  9.5  9.3   Total Protein 6.5 - 8.1 g/dL 6.7     Total Bilirubin 0.3 - 1.2 mg/dL 0.5     Alkaline Phos 38 - 126 U/L 55     AST 15 - 41 U/L 20     ALT 0 - 44 U/L 23       DIAGNOSTIC IMAGING:  I have independently reviewed the scans and discussed with the patient.  ASSESSMENT:  Nonmuscle invasive bladder cancer: - CTAP with and without contrast on 04/09/2018 showed 2.8 x 4.1 cm exophytic mass in the posterior right bladder near the UVJ.  No pelvic sidewall lymphadenopathy.  Upper normal to borderline lymph nodes seen in the hepatoduodenal ligament, atypical for bladder cancer in the absence of pelvic lymphadenopathy.  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 - Cystoscopy and transurethral resection on 05/05/2018. 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  Carcinoma invades lamina propria in the level below muscularis mucosa.  LVI +.  Muscularis propria is present and not involved. - She is started on intravesical BCG around January 2020. - She is currently receiving BCG weekly for 3 weeks every 6 months under the direction of Dr. Alyson Ingles.    Social/family history: - She lives at home with her husband.  She worked in [employer] fund[employer] and has exposure to chemicals.  She is  non-smoker.  She also worked on a tobacco farm. - Father had colon cancer.  Brother had colon cancer.  Another brother had lung cancer.  2 sisters had breast cancer.  1 sister had bladder cancer.     PLAN:  1.  Nonmuscle invasive bladder cancer, high-grade: - She has completed BCG treatments in April 2023.  She reports urinary frequency every 2 hours.  No dysuria.  No B symptoms. - Cystoscopy on 11/22/2021: No evidence of tumor. - She complains of pain in the right lateral rib region and right loin region for the past few months on and off. - I have reviewed labs today which showed normal LFTs and creatinine and calcium levels.  CBC was grossly normal. - Recommend CT [facility] with contrast. - RTC after scans.  2.  Fibroadenoma of the breast: - She had biopsies of the right breast in November 2017-fibroadenoma and calcifications. - Mammogram on 05/28/2022 was BI-RADS Category 1.      Orders placed this encounter:  Orders Placed This Encounter  Procedures   CT Abdomen Pelvis W Contrast   CT Abdomen Pelvis W Contrast   CBC with Differential/Platelet   Comprehensive metabolic panel      Derek Jack, MD [facility] [phone]

## 2022-07-11 NOTE — Patient Instructions (Addendum)
[facility]  Discharge Instructions  You were seen and examined today by Dr. Delton Coombes.  Dr. Delton Coombes discussed your most recent lab work and mammogram which revealed that everything looks good.  Dr. Delton Coombes is going to schedule you for a CT scan of your abdomen and pelvis to follow up on the pain you have been having.  Follow-up as scheduled.    Thank you for choosing [facility] to provide your oncology and hematology care.   To afford each patient quality time with our provider, please arrive at least 15 minutes before your scheduled appointment time. You may need to reschedule your appointment if you arrive late (10 or more minutes). Arriving late affects you and other patients whose appointments are after yours.  Also, if you miss three or more appointments without notifying the office, you may be dismissed from the clinic at the provider's discretion.    Again, thank you for choosing [facility].  Our hope is that these requests will decrease the amount of time that you wait before being seen by our physicians.   If you have a lab appointment with the [facility] please come in thru the Main Entrance and check in at the main information desk.           _____________________________________________________________  Should you have questions after your visit to [facility], please contact our office at [phone] and follow the prompts.  Our office hours are 8:00 a.m. to 4:30 p.m. Monday - Thursday and 8:00 a.m. to 2:30 p.m. Friday.  Please note that voicemails left after 4:00 p.m. may not be returned until the following business day.  We are closed weekends and all major holidays.  You do have access to a nurse 24-7, just call the main number to the clinic [phone] and do not press any options, hold on the line and a nurse will answer the phone.    For prescription refill requests, have  your pharmacy contact our office and allow 72 hours.    Masks are optional in the cancer centers. If you would like for your care team to wear a mask while they are taking care of you, please let them know. You may have one support person who is at least 78 years old accompany you for your appointments.

## 2022-07-24 ENCOUNTER — Ambulatory Visit: Payer: Medicare HMO | Admitting: Hematology

## 2022-07-25 ENCOUNTER — Ambulatory Visit (HOSPITAL_COMMUNITY)
Admission: RE | Admit: 2022-07-25 | Discharge: 2022-07-25 | Disposition: A | Discharge status: Home / Self Care | Payer: Medicare HMO | Source: Ambulatory Visit | Attending: Hematology | Admitting: Hematology

## 2022-07-25 DIAGNOSIS — C672 Malignant neoplasm of lateral wall of bladder: Secondary | ICD-10-CM | POA: Insufficient documentation

## 2022-07-25 DIAGNOSIS — K760 Fatty (change of) liver, not elsewhere classified: Secondary | ICD-10-CM | POA: Diagnosis not present

## 2022-07-25 DIAGNOSIS — R109 Unspecified abdominal pain: Secondary | ICD-10-CM | POA: Diagnosis not present

## 2022-07-25 MED ORDER — IOHEXOL 300 MG/ML  SOLN
100.0000 mL | Freq: Once | INTRAMUSCULAR | Status: AC | PRN
  Administered 2022-07-25: 100 mL via INTRAVENOUS

## 2022-07-26 ENCOUNTER — Inpatient Hospital Stay: Payer: Medicare HMO | Admitting: Hematology

## 2022-07-26 VITALS — BP 130/99 | HR 82 | Temp 97.5°F | Resp 16 | Wt 192.8 lb

## 2022-07-26 DIAGNOSIS — D241 Benign neoplasm of right breast: Secondary | ICD-10-CM | POA: Diagnosis not present

## 2022-07-26 DIAGNOSIS — Z79899 Other long term (current) drug therapy: Secondary | ICD-10-CM | POA: Diagnosis not present

## 2022-07-26 DIAGNOSIS — C672 Malignant neoplasm of lateral wall of bladder: Secondary | ICD-10-CM | POA: Diagnosis not present

## 2022-07-26 NOTE — Progress Notes (Signed)
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  PRIOR THERAPY: BCG treatments completed in April 2023  NGS Results: Not done  CURRENT THERAPY: Observation  BRIEF ONCOLOGIC HISTORY:  Oncology History  Malignant neoplasm of lateral wall of bladder ([place])  10/07/2019 Initial Diagnosis   Malignant neoplasm of lateral wall of bladder ([place])   07/19/2021 Cancer Staging   Staging form: Urinary Bladder, AJCC 8th Edition - Clinical stage from 07/19/2021: Stage I (ycT1, cN0, cM0) - Signed by Derek Jack, MD on 07/19/2021 Stage prefix: Post-therapy WHO/ISUP grade (low/high): High Grade Histologic grading system: 2 grade system     CANCER STAGING:  Cancer Staging  Malignant neoplasm of lateral wall of bladder ([place]) Staging form: Urinary Bladder, AJCC 8th Edition - Clinical stage from 07/19/2021: Stage I (ycT1, cN0, cM0) - Signed by Derek Jack, MD on 07/19/2021    INTERVAL HISTORY:  Ms. Kraner 76 y.o. female returns for follow-up of nonmuscle invasive high-grade bladder cancer.  She underwent CT of the abdomen and pelvis after last visit.  Denies hematuria.  Reports energy levels 50 to 60%.    REVIEW OF SYSTEMS:  Review of Systems  Respiratory:  Positive for shortness of breath.   Gastrointestinal:  Positive for nausea.  Neurological:  Positive for dizziness and headaches.  Psychiatric/Behavioral:  Positive for sleep disturbance.   All other systems reviewed and are negative.    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 Gala Romney: Multiple colonic polyps removed, tubular adenomas.  Next colonoscopy in 5 years   COLONOSCOPY N/A 12/25/2019   five semi-pedunculated polyps in descending colon and IC valve, 3-7 mm in size. Tubular adenomas. 3 year surveillance.    CYSTOSCOPY W/ URETERAL STENT PLACEMENT Bilateral 05/05/2018   Procedure: CYSTOSCOPY WITH BILATERAL RETROGRADE PYELOGRAM/ RIGHT URETERAL STENT PLACEMENT;  Surgeon: Cleon Gustin, MD;  Location: [facility];  Service: Urology;  Laterality: Bilateral;   HIP PINNING,CANNULATED Right 01/19/2022   Procedure: CLOSED REDUCTION PERCUTANEOUS PINNING RIGHT FEMORAL NECK;  Surgeon: Willaim Sheng, MD;  Location: [facility];  Service: Orthopedics;  Laterality: Right;   LUMBAR DISC SURGERY     PARTIAL HYSTERECTOMY     POLYPECTOMY  12/25/2019   Procedure: POLYPECTOMY;  Surgeon: Daneil Dolin, MD;  Location: [facility];  Service: Endoscopy;;  ileocecal valve polyp; descending x2;   TRANSURETHRAL RESECTION OF BLADDER TUMOR N/A 05/05/2018   Procedure: TRANSURETHRAL RESECTION OF BLADDER TUMOR (TURBT);  Surgeon: Nicolette Bang  L, MD;  Location: [facility];  Service: Urology;  Laterality: N/A;   TRANSURETHRAL RESECTION OF BLADDER TUMOR N/A 06/16/2018   Procedure: TRANSURETHRAL RESECTION OF BLADDER TUMOR (TURBT);  Surgeon: Cleon Gustin, MD;  Location: [facility];  Service: Urology;  Laterality: N/A;     SOCIAL HISTORY:  Social History   Socioeconomic History   Marital status: Married    Spouse name: Not on file   Number of children: 2   Years of  education: Not on file   Highest education level: Not on file  Occupational History   Occupation: Technical sales engineer    Comment: Laundry, Pharmacist, community  Tobacco Use   Smoking status: Never   Smokeless tobacco: Never  Vaping Use   Vaping Use: Never used  Substance and Sexual Activity   Alcohol use: Yes    Alcohol/week: 2.0 standard drinks of alcohol    Types: 2 Glasses of wine per week    Comment: occ wine   Drug use: No   Sexual activity: Not Currently    Birth control/protection: Post-menopausal, Surgical  Other Topics Concern   Not on file  Social History Narrative   Exercises regularly   Social Determinants of Health   Financial Resource Strain: Not on file  Food Insecurity: Not on file  Transportation Needs: Not on file  Physical Activity: Not on file  Stress: Not on file  Social Connections: Not on file  Intimate Partner Violence: Not on file    FAMILY HISTORY:  Family History  Problem Relation Age of Onset   Cancer Father        colon cancer; also brother-colon And sister-breast   Heart disease Father    Heart disease Mother        at advanced age   Hypertension Mother    Hyperlipidemia Mother    Osteoporosis Mother    Cancer Brother        liver and colon   Breast cancer Sister    Cancer Sister 39       breast   Breast cancer Sister    Breast cancer Paternal Aunt     CURRENT MEDICATIONS:  Outpatient Encounter Medications as of 07/26/2022  Medication Sig   acetaminophen (TYLENOL) 500 MG tablet Take 500 mg by mouth every 6 (six) hours as needed for moderate pain.   albuterol (VENTOLIN HFA) 108 (90 Base) MCG/ACT inhaler Inhale 2 puffs into the lungs every 4 (four) hours as needed for shortness of breath or wheezing.   colchicine 0.6 MG tablet TAKE 2 TABLETS BY MOUTH ONCE. 1 HOUR LATER, TAKE 1 TABLET BY MOUTH.   COLLAGEN PO Take 1 Scoop by mouth 3 (three) times a week.   GARLIC PO Take 1 tablet by mouth once a week.   hydrochlorothiazide (HYDRODIURIL) 12.5  MG tablet TAKE 1 TABLET BY MOUTH EVERY DAY   hydrocortisone cream 1 % Apply 1 Application topically daily as needed for itching.   levothyroxine (SYNTHROID) 25 MCG tablet Take 25 mcg by mouth daily before breakfast.   lisinopril (ZESTRIL) 5 MG tablet Take 5 mg by mouth daily.   loratadine (CLARITIN) 10 MG tablet Take 10 mg by mouth daily as needed for allergies.   Melatonin 10 MG TABS Take 10 mg by mouth daily as needed (sleep).   meloxicam (MOBIC) 7.5 MG tablet TAKE 1 TABLET BY MOUTH EVERY DAY AS NEEDED FOR PAIN   METAMUCIL FIBER PO Take 2 Scoops by mouth 4 (four) times a week.   Multiple  Vitamin (MULTIVITAMIN WITH MINERALS) TABS tablet Take 1 tablet by mouth 2 (two) times a week.   nystatin cream (MYCOSTATIN) Apply 1 Application topically daily as needed (yeast).   Facility-Administered Encounter Medications as of 07/26/2022  Medication   ciprofloxacin (CIPRO) tablet 500 mg    ALLERGIES:  Allergies  Allergen Reactions   Verapamil Swelling and Other (See Comments)    Swelling of leg     PHYSICAL EXAM:  ECOG Performance status: 1  Vitals:   07/26/22 0950  BP: (!) 130/99  Pulse: 82  Resp: 16  Temp: (!) 97.5 F (36.4 C)  SpO2: 95%   Filed Weights   07/26/22 0950  Weight: 192 lb 12.8 oz (87.5 kg)   Physical Exam Vitals reviewed.  Constitutional:      Appearance: Normal appearance.  Cardiovascular:     Rate and Rhythm: Normal rate and regular rhythm.     Heart sounds: Normal heart sounds.  Pulmonary:     Effort: Pulmonary effort is normal.     Breath sounds: Normal breath sounds.  Neurological:     Mental Status: She is alert.  Psychiatric:        Mood and Affect: Mood normal.        Behavior: Behavior normal.      LABORATORY DATA:  I have reviewed the labs as listed.  CBC    Component Value Date/Time   WBC 6.1 07/11/2022 1510   RBC 4.84 07/11/2022 1510   HGB 14.4 07/11/2022 1510   HCT 43.3 07/11/2022 1510   PLT 186 07/11/2022 1510   MCV 89.5  07/11/2022 1510   MCH 29.8 07/11/2022 1510   MCHC 33.3 07/11/2022 1510   RDW 12.0 07/11/2022 1510   LYMPHSABS 2.2 07/11/2022 1510   MONOABS 0.3 07/11/2022 1510   EOSABS 0.2 07/11/2022 1510   BASOSABS 0.1 07/11/2022 1510      Latest Ref Rng & Units 07/11/2022    3:10 PM 01/20/2022    3:58 AM 01/19/2022    3:30 PM  CMP  Glucose 70 - 99 mg/dL 123  139  102   BUN 8 - 23 mg/dL '17  23  19   '$ Creatinine 0.44 - 1.00 mg/dL 0.87  0.93  0.84   Sodium 135 - 145 mmol/L 137  141  138   Potassium 3.5 - 5.1 mmol/L 3.7  4.3  4.0   Chloride 98 - 111 mmol/L 103  103  104   CO2 22 - 32 mmol/L '25  28  28   '$ Calcium 8.9 - 10.3 mg/dL 9.2  9.5  9.3   Total Protein 6.5 - 8.1 g/dL 6.7     Total Bilirubin 0.3 - 1.2 mg/dL 0.5     Alkaline Phos 38 - 126 U/L 55     AST 15 - 41 U/L 20     ALT 0 - 44 U/L 23       DIAGNOSTIC IMAGING:  I have independently reviewed the scans and discussed with the patient.  ASSESSMENT: Nonmuscle invasive bladder cancer: - CTAP with and without contrast on 04/09/2018 showed 2.8 x 4.1 cm exophytic mass in the posterior right bladder near the UVJ.  No pelvic sidewall lymphadenopathy.  Upper normal to borderline lymph nodes seen in the hepatoduodenal ligament, atypical for bladder cancer in the absence of pelvic lymphadenopathy.  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 - Cystoscopy and transurethral resection on 05/05/2018. 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  Carcinoma invades lamina propria in the level below muscularis mucosa.  LVI +.  Muscularis propria is present and not involved. - She is started on intravesical BCG around January 2020. - She is currently receiving BCG weekly for 3 weeks every 6 months under the direction of Dr. Alyson Ingles.  Cystoscopy on 11/22/2021: No evidence of tumor    Social/family history: - She lives at home with her husband.  She worked in [employer] fund[employer] and has exposure  to chemicals.  She is non-smoker.  She also worked on a tobacco farm. - Father had colon cancer.  Brother had colon cancer.  Another brother had lung cancer.  2 sisters had breast cancer.  1 sister had bladder cancer.    PLAN:  1.  High-grade nonmuscle invasive bladder cancer: - She completed BCG treatments in April 2023.  She reports urinary frequency but no hematuria. - Cystoscopy on 11/22/2021: No evidence of tumor. - As she complained of pain in the right side of the abdomen for the past few months, I have ordered a CT scan of the abdomen and pelvis. - Labs from 07/11/2022 showed normal LFTs and CBC. - CTAP (07/25/2022): No evidence of recurrent or metastatic bladder cancer.  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  No radiographic evidence of cholecystitis. - She can undergo surveillance for angiomyolipoma that is less than 6 cm and has benign features.  It has not grown since 2019 scan. - She has an appointment with Dr. Alyson Ingles in April 2024 for cystoscopy. - RTC 1 year for follow-up.      Orders placed this encounter:  Orders Placed This Encounter  Procedures   CBC with Differential   Comprehensive metabolic panel      Derek Jack, MD [place] [phone]

## 2022-07-26 NOTE — Patient Instructions (Addendum)
[place] at [facility] Discharge Instructions   You were seen and examined today by Dr. Delton Coombes.  He reviewed the results of your CT scan which is normal.   He reviewed the results of your lab work which are normal.   We will see you back in a year.    Thank you for choosing [place] at [facility] to provide your oncology and hematology care.  To afford each patient quality time with our provider, please arrive at least 15 minutes before your scheduled appointment time.   If you have a lab appointment with the [facility] please come in thru the Main Entrance and check in at the main information desk.  You need to re-schedule your appointment should you arrive 10 or more minutes late.  We strive to give you quality time with our providers, and arriving late affects you and other patients whose appointments are after yours.  Also, if you no show three or more times for appointments you may be dismissed from the clinic at the providers discretion.     Again, thank you for choosing [facility].  Our hope is that these requests will decrease the amount of time that you wait before being seen by our physicians.       _____________________________________________________________  Should you have questions after your visit to [facility], please contact our office at [phone] and follow the prompts.  Our office hours are 8:00 a.m. and 4:30 p.m. Monday - Friday.  Please note that voicemails left after 4:00 p.m. may not be returned until the following business day.  We are closed weekends and major holidays.  You do have access to a nurse 24-7, just call the main number to the clinic [phone] and do not press any options, hold on the line and a nurse will answer the phone.    For prescription refill requests, have your pharmacy contact our office and allow 72 hours.    Due to Covid, you will need to wear a  mask upon entering the hospital. If you do not have a mask, a mask will be given to you at the Main Entrance upon arrival. For doctor visits, patients may have 1 support person age 59 or older with them. For treatment visits, patients can not have anyone with them due to social distancing guidelines and our immunocompromised population.

## 2022-08-08 DIAGNOSIS — E039 Hypothyroidism, unspecified: Secondary | ICD-10-CM | POA: Diagnosis not present

## 2022-08-08 DIAGNOSIS — R7303 Prediabetes: Secondary | ICD-10-CM | POA: Diagnosis not present

## 2022-08-08 DIAGNOSIS — I10 Essential (primary) hypertension: Secondary | ICD-10-CM | POA: Diagnosis not present

## 2022-08-15 DIAGNOSIS — E782 Mixed hyperlipidemia: Secondary | ICD-10-CM | POA: Diagnosis not present

## 2022-08-15 DIAGNOSIS — C679 Malignant neoplasm of bladder, unspecified: Secondary | ICD-10-CM | POA: Diagnosis not present

## 2022-08-15 DIAGNOSIS — Z0000 Encounter for general adult medical examination without abnormal findings: Secondary | ICD-10-CM | POA: Diagnosis not present

## 2022-08-15 DIAGNOSIS — R809 Proteinuria, unspecified: Secondary | ICD-10-CM | POA: Diagnosis not present

## 2022-08-15 DIAGNOSIS — E039 Hypothyroidism, unspecified: Secondary | ICD-10-CM | POA: Diagnosis not present

## 2022-08-15 DIAGNOSIS — R7303 Prediabetes: Secondary | ICD-10-CM | POA: Diagnosis not present

## 2022-08-15 DIAGNOSIS — B372 Candidiasis of skin and nail: Secondary | ICD-10-CM | POA: Diagnosis not present

## 2022-08-15 DIAGNOSIS — I10 Essential (primary) hypertension: Secondary | ICD-10-CM | POA: Diagnosis not present

## 2022-08-28 DIAGNOSIS — K219 Gastro-esophageal reflux disease without esophagitis: Secondary | ICD-10-CM | POA: Diagnosis not present

## 2022-08-28 DIAGNOSIS — Z79899 Other long term (current) drug therapy: Secondary | ICD-10-CM | POA: Diagnosis not present

## 2022-11-21 ENCOUNTER — Ambulatory Visit: Payer: Medicare HMO | Admitting: Urology

## 2022-11-21 VITALS — BP 138/84 | HR 86

## 2022-11-21 DIAGNOSIS — Z8551 Personal history of malignant neoplasm of bladder: Secondary | ICD-10-CM

## 2022-11-21 DIAGNOSIS — C672 Malignant neoplasm of lateral wall of bladder: Secondary | ICD-10-CM | POA: Diagnosis not present

## 2022-11-21 DIAGNOSIS — C679 Malignant neoplasm of bladder, unspecified: Secondary | ICD-10-CM

## 2022-11-21 LAB — URINALYSIS, ROUTINE W REFLEX MICROSCOPIC
Bilirubin, UA: NEGATIVE
Glucose, UA: NEGATIVE
Ketones, UA: NEGATIVE
Leukocytes,UA: NEGATIVE
Nitrite, UA: NEGATIVE
Protein,UA: NEGATIVE
RBC, UA: NEGATIVE
Specific Gravity, UA: 1.02 (ref 1.005–1.030)
Urobilinogen, Ur: 0.2 mg/dL (ref 0.2–1.0)
pH, UA: 5.5 (ref 5.0–7.5)

## 2022-11-21 MED ORDER — CIPROFLOXACIN HCL 500 MG PO TABS
500.0000 mg | ORAL_TABLET | Freq: Once | ORAL | Status: AC
  Administered 2022-11-21: 500 mg via ORAL

## 2022-11-21 NOTE — Progress Notes (Signed)
   11/21/22  CC: followup bladder cancer   HPI: Ms Colon is a 77yo here for followup for bladder cancer Blood pressure 138/84, pulse 86. NED. A&Ox3.   No respiratory distress   Abd soft, NT, ND Normal external genitalia with patent urethral meatus  Cystoscopy Procedure Note  Patient identification was confirmed, informed consent was obtained, and patient was prepped using Betadine solution.  Lidocaine jelly was administered per urethral meatus.    Procedure: - Flexible cystoscope introduced, without any difficulty.   - Thorough search of the bladder revealed:    normal urethral meatus    normal urothelium    no stones    no ulcers     no tumors    no urethral polyps    no trabeculation  - Ureteral orifices were normal in position and appearance.  Post-Procedure: - Patient tolerated the procedure well  Assessment/ Plan: Followup 1 year for cystoscopy   No follow-ups on file.  Wilkie Aye, MD

## 2022-11-27 ENCOUNTER — Encounter: Payer: Self-pay | Admitting: Urology

## 2022-11-27 NOTE — Patient Instructions (Signed)

## 2022-12-04 IMAGING — CT CT CERVICAL SPINE W/O CM
3 of 4 series · 12 of 33 positions shown, 14 images · non-contrast
Comparison: No priors.

CLINICAL DATA: 73-year-old female with history of worsening
headache and neck pain for 1 month.

EXAM:
CT HEAD WITHOUT CONTRAST
CT CERVICAL SPINE WITHOUT CONTRAST
TECHNIQUE: Multidetector CT imaging of the head and cervical spine was
performed following the standard protocol without intravenous
contrast. Multiplanar CT image reconstructions of the cervical spine
were also generated.

[Series 5: sagittal bone · sagittal · 0.25mm/px · 5 of 66 slices shown, 6 images]
[im 22/66  bone]
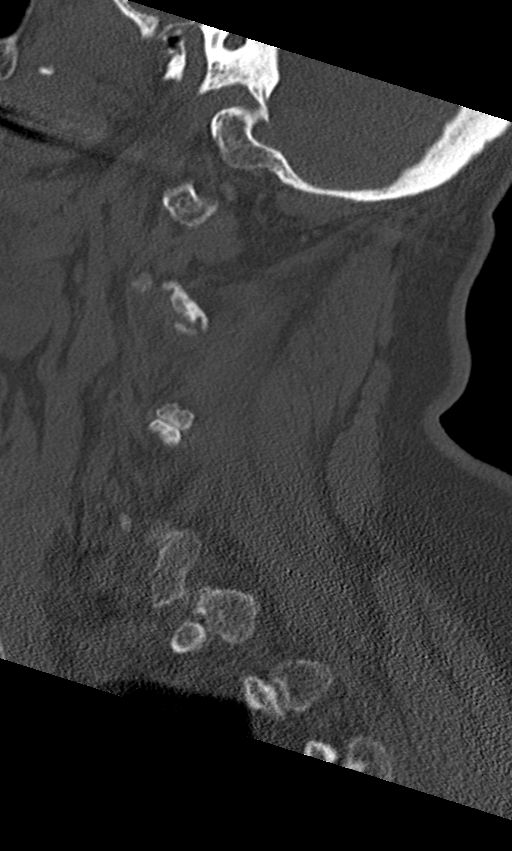
[im 28/66  bone]
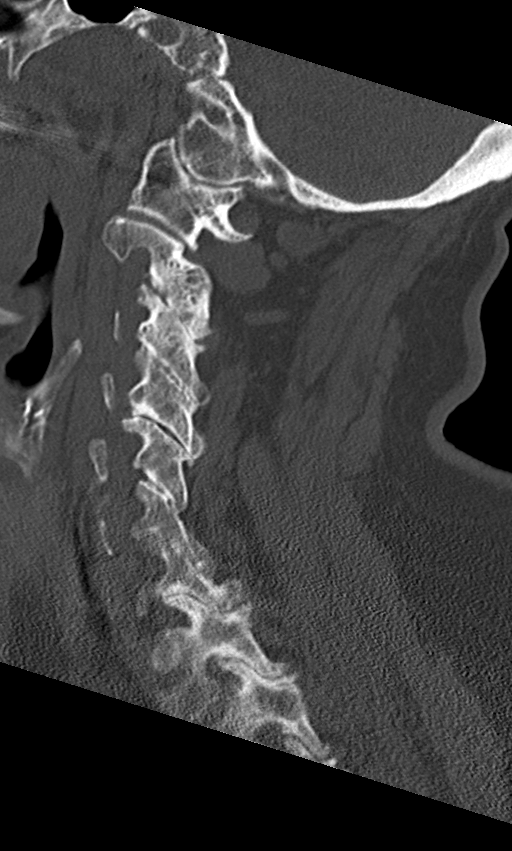
[im 33/66  soft-tissue]
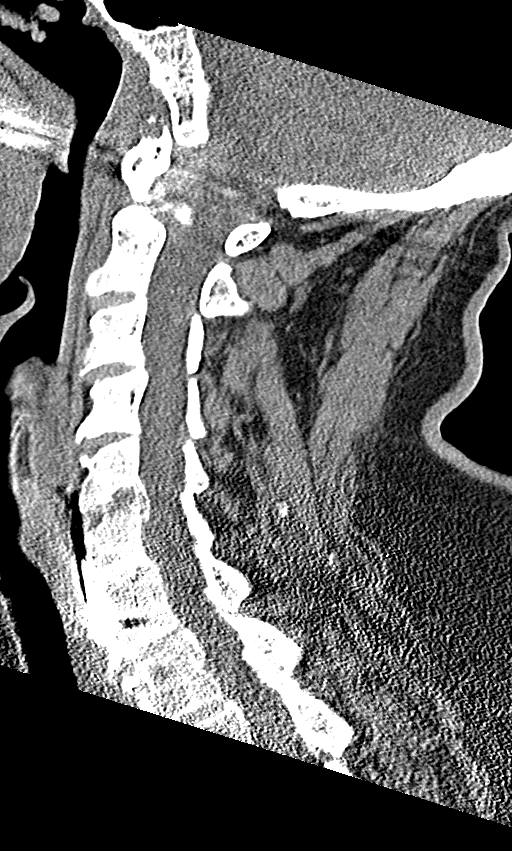
[im 33/66  bone]
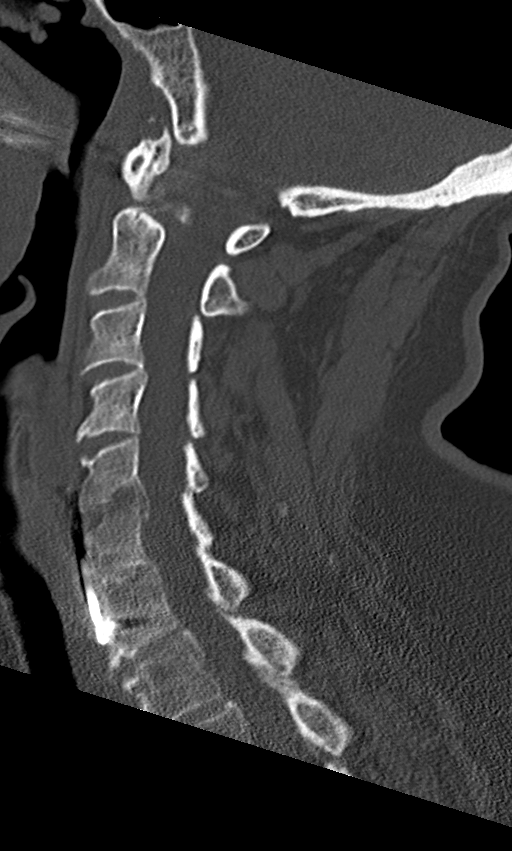
[im 38/66  bone]
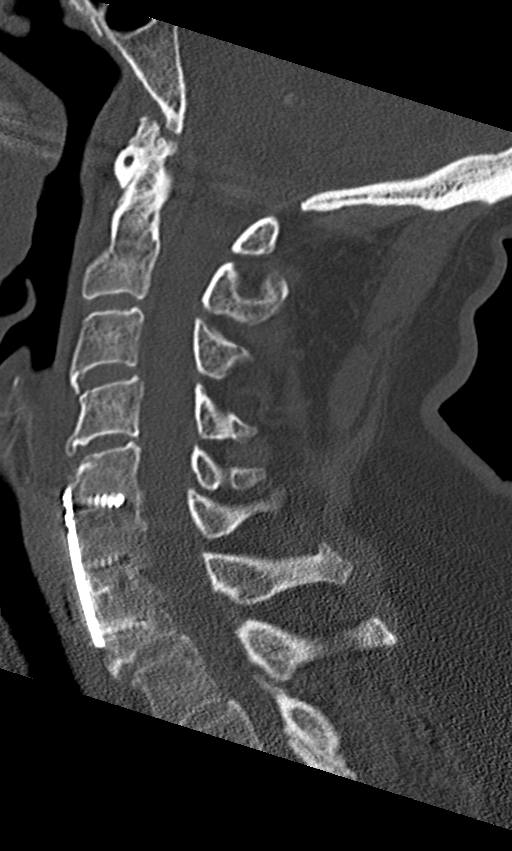
[im 44/66  bone]
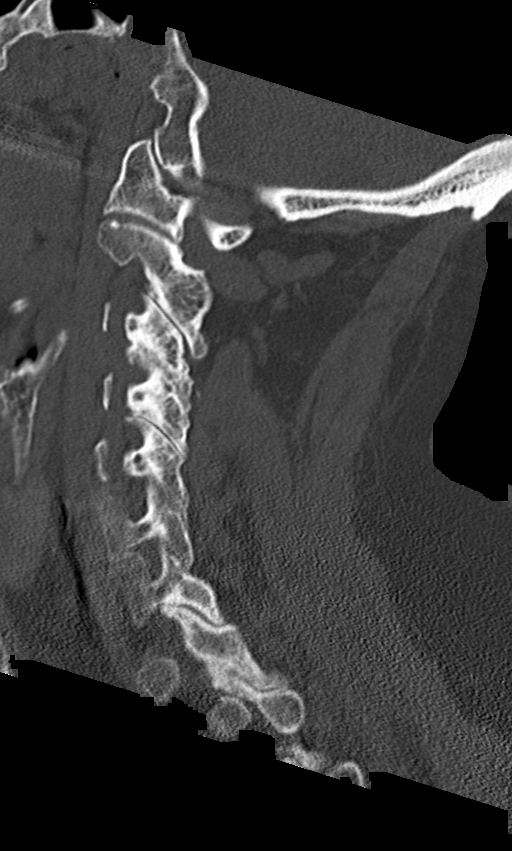

[Series 6: coronal bone · coronal · 0.27mm/px · 3 of 59 slices shown]
[im 12/59  bone]
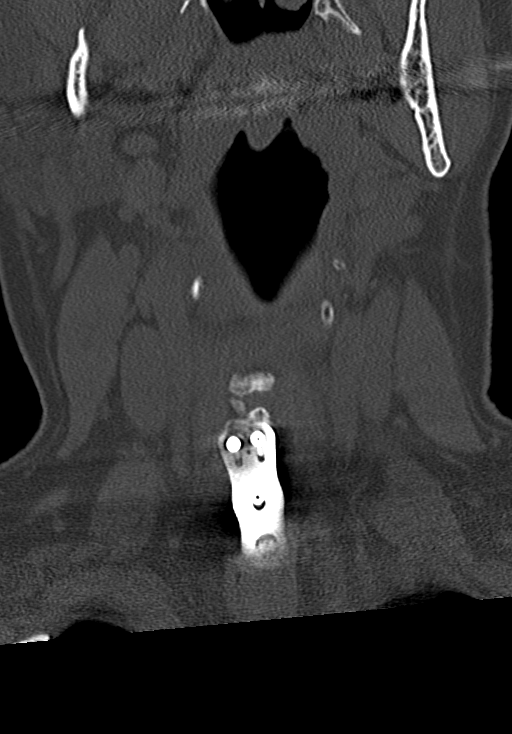
[im 24/59  bone]
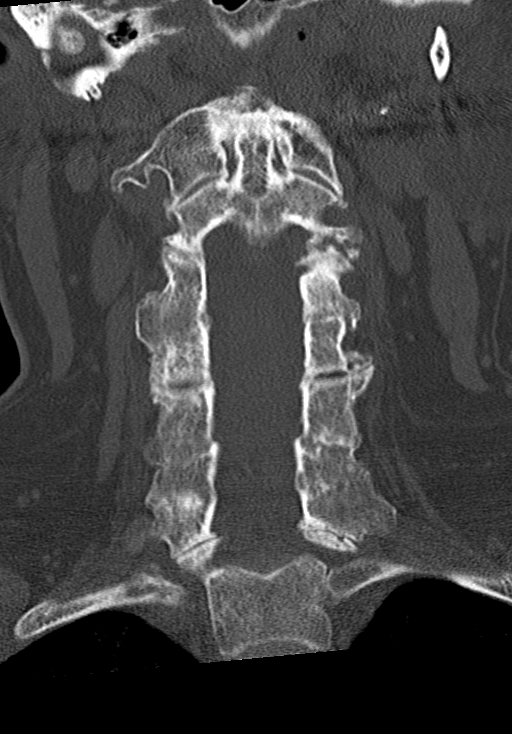
[im 35/59  bone]
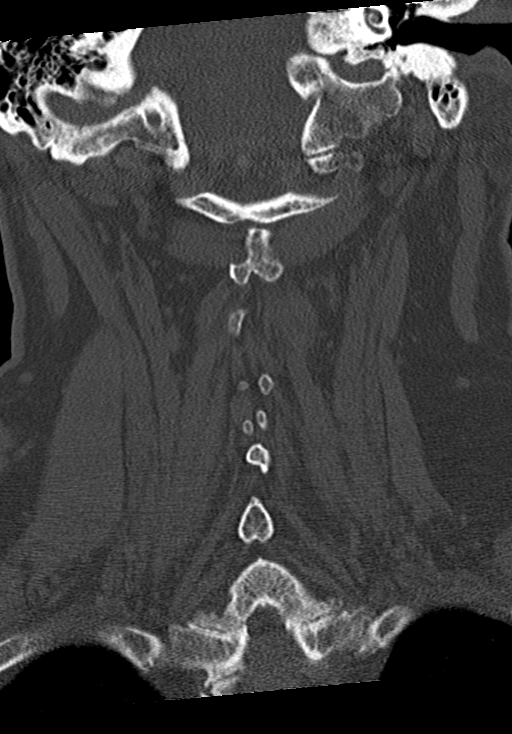

[Series 7: orthogonal axials · axial · 0.21mm/px · z∈[+1129,+1243]mm · 4 of 89 slices shown, 5 images]
[im 15/89  soft-tissue]
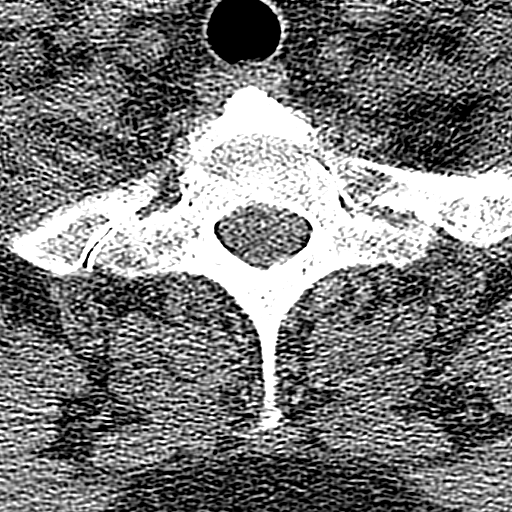
[im 15/89  bone]
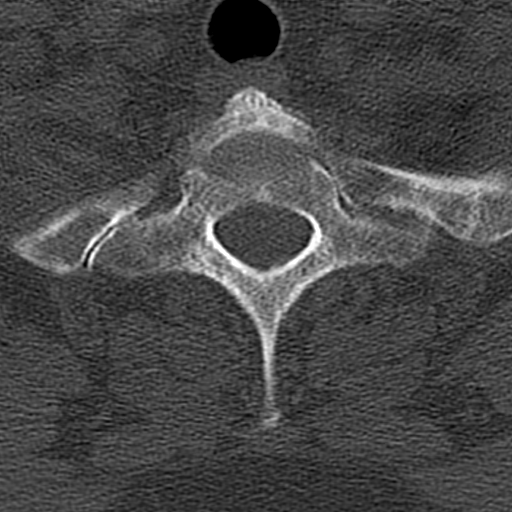
[im 30/89  bone]
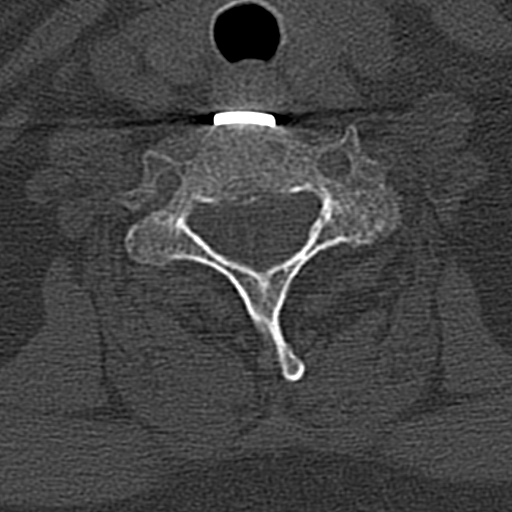
[im 59/89  bone]
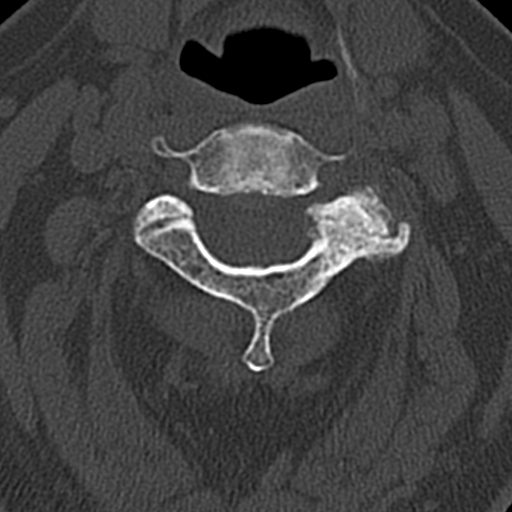
[im 74/89  bone]
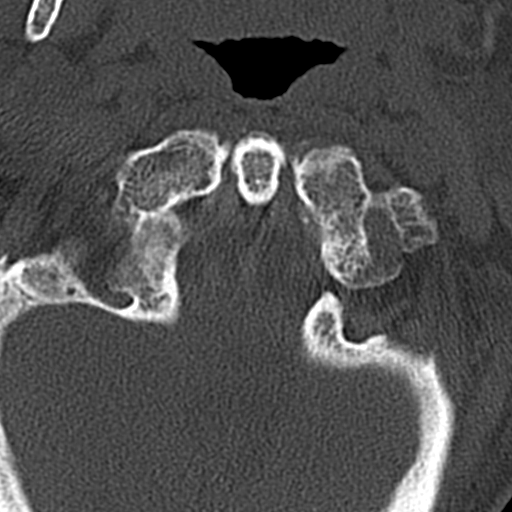

[12 of 33 positions shown; findings below may reference images not displayed]

FINDINGS: CT HEAD FINDINGS

Brain: No evidence of acute infarction, hemorrhage, hydrocephalus,
extra-axial collection or mass lesion/mass effect.

Vascular: No hyperdense vessel or unexpected calcification.

Skull: Normal. Negative for fracture or focal lesion.

Sinuses/Orbits: No acute finding.

Other: None.

CT CERVICAL SPINE FINDINGS

Alignment: Normal.

Skull base and vertebrae: Status post ACDF from C5-C7, with no acute
hardware complications. No acute fracture. No primary bone lesion or
focal pathologic process.

Soft tissues and spinal canal: No prevertebral fluid or swelling. No
visible canal hematoma.

Disc levels: Status post ACDF at C5-C7 with complete fusion at C5-C6
and C6-C7. Degenerative disc disease noted at C7-T1. Moderate to
severe multilevel facet arthropathy.

Upper chest: Negative.

Other: None.
IMPRESSION: 1. No acute intracranial abnormalities. The appearance of the brain
is normal.
2. No acute abnormality of the cervical spine.
3. Multilevel degenerative disc disease and cervical spondylosis,
status post ACDF from C5-C7, as above.

## 2022-12-11 ENCOUNTER — Encounter: Payer: Self-pay | Admitting: *Deleted

## 2023-02-07 DIAGNOSIS — E039 Hypothyroidism, unspecified: Secondary | ICD-10-CM | POA: Diagnosis not present

## 2023-02-07 DIAGNOSIS — I10 Essential (primary) hypertension: Secondary | ICD-10-CM | POA: Diagnosis not present

## 2023-02-07 DIAGNOSIS — R7303 Prediabetes: Secondary | ICD-10-CM | POA: Diagnosis not present

## 2023-02-13 ENCOUNTER — Other Ambulatory Visit (HOSPITAL_COMMUNITY): Payer: Self-pay | Admitting: Family Medicine

## 2023-02-13 ENCOUNTER — Ambulatory Visit (HOSPITAL_COMMUNITY)
Admission: RE | Admit: 2023-02-13 | Discharge: 2023-02-13 | Disposition: A | Discharge status: Home / Self Care | Payer: Medicare HMO | Source: Ambulatory Visit | Attending: Family Medicine | Admitting: Family Medicine

## 2023-02-13 DIAGNOSIS — M7989 Other specified soft tissue disorders: Secondary | ICD-10-CM | POA: Diagnosis not present

## 2023-02-13 DIAGNOSIS — M8580 Other specified disorders of bone density and structure, unspecified site: Secondary | ICD-10-CM | POA: Diagnosis not present

## 2023-02-13 DIAGNOSIS — R062 Wheezing: Secondary | ICD-10-CM | POA: Diagnosis not present

## 2023-02-13 DIAGNOSIS — M7031 Other bursitis of elbow, right elbow: Secondary | ICD-10-CM | POA: Insufficient documentation

## 2023-02-13 DIAGNOSIS — E782 Mixed hyperlipidemia: Secondary | ICD-10-CM | POA: Diagnosis not present

## 2023-02-13 DIAGNOSIS — M778 Other enthesopathies, not elsewhere classified: Secondary | ICD-10-CM | POA: Diagnosis not present

## 2023-02-13 DIAGNOSIS — R809 Proteinuria, unspecified: Secondary | ICD-10-CM | POA: Diagnosis not present

## 2023-02-13 DIAGNOSIS — R7303 Prediabetes: Secondary | ICD-10-CM | POA: Diagnosis not present

## 2023-02-13 DIAGNOSIS — C679 Malignant neoplasm of bladder, unspecified: Secondary | ICD-10-CM | POA: Diagnosis not present

## 2023-02-13 DIAGNOSIS — E039 Hypothyroidism, unspecified: Secondary | ICD-10-CM | POA: Diagnosis not present

## 2023-02-13 DIAGNOSIS — I10 Essential (primary) hypertension: Secondary | ICD-10-CM | POA: Diagnosis not present

## 2023-02-13 DIAGNOSIS — B372 Candidiasis of skin and nail: Secondary | ICD-10-CM | POA: Diagnosis not present

## 2023-03-07 ENCOUNTER — Other Ambulatory Visit: Payer: Self-pay | Admitting: *Deleted

## 2023-03-07 ENCOUNTER — Encounter: Payer: Self-pay | Admitting: Gastroenterology

## 2023-03-07 ENCOUNTER — Encounter: Payer: Self-pay | Admitting: *Deleted

## 2023-03-07 ENCOUNTER — Telehealth: Payer: Self-pay | Admitting: *Deleted

## 2023-03-07 ENCOUNTER — Ambulatory Visit: Payer: Medicare HMO | Admitting: Gastroenterology

## 2023-03-07 VITALS — BP 137/88 | HR 73 | Temp 98.7°F | Ht 66.5 in | Wt 189.4 lb

## 2023-03-07 DIAGNOSIS — K219 Gastro-esophageal reflux disease without esophagitis: Secondary | ICD-10-CM

## 2023-03-07 DIAGNOSIS — Z860100 Personal history of colon polyps, unspecified: Secondary | ICD-10-CM

## 2023-03-07 DIAGNOSIS — Z800 Family history of malignant neoplasm of digestive organs: Secondary | ICD-10-CM | POA: Diagnosis not present

## 2023-03-07 MED ORDER — PEG 3350-KCL-NA BICARB-NACL 420 G PO SOLR: 4000.0000 mL | Freq: Once | ORAL | 0 refills | Status: AC

## 2023-03-07 NOTE — H&P (View-Only) (Signed)
 Gastroenterology Office Note     Primary Care Physician:  Benita Stabile, MD  Primary Gastroenterologist: Dr. Jena Gauss    Chief Complaint   Chief Complaint  Patient presents with   Colonoscopy     History of Present Illness   Kayla [name] is a 77 y.o. female presenting today with a history of constipation, bloating, and GERD. Celiac serologies negative in the past. Last seen in Sept 2022 and doing well with supplementary fiber and dietary measures for GERD. Colonoscopy due this year (3-year-surveillance) due to history of multiple adenomas. FH colon cancer also in brother and father.   Continues to do well with balanced diet and Metamucil along with lots of water. GERD controlled with dietary measures and will take omeprazole just if needed. No dysphagia. No rectal bleeding. No unintentional weight loss. She has no GI concerns today.    May 2021 colonoscopy: five semi-pedunculated polyps in descending colon and IC valve, 3-7 mm in size. Tubular adenomas. 3 year surveillance.   Both father and brother had colon cancer.    Past Medical History:  Diagnosis Date   Arthritis    Asthma    excercise induced   Benign brain tumor (HCC) 2002   Treated at [facility] with gamma knife   Chest discomfort    GERD (gastroesophageal reflux disease)    No current issues   History of bladder cancer    History of carpal tunnel syndrome    Bilateral   Hypertension    Hypothyroidism    Impaired fasting glucose    Irritable bowel syndrome    Osteopenia    Tinnitus    Venous insufficiency     Past Surgical History:  Procedure Laterality Date   ABDOMINAL HYSTERECTOMY     APPENDECTOMY     BREAST BIOPSY Right    CARPAL TUNNEL RELEASE     Bilateral   COLONOSCOPY N/A 09/15/2014   Dr. Jena Gauss: Multiple colonic polyps removed, tubular adenomas.  Next colonoscopy in 5 years   COLONOSCOPY N/A 12/25/2019   five semi-pedunculated polyps in descending colon and IC valve, 3-7 mm in size. Tubular  adenomas. 3 year surveillance.    CYSTOSCOPY W/ URETERAL STENT PLACEMENT Bilateral 05/05/2018   Procedure: CYSTOSCOPY WITH BILATERAL RETROGRADE PYELOGRAM/ RIGHT URETERAL STENT PLACEMENT;  Surgeon: Malen Gauze, MD;  Location: [facility];  Service: Urology;  Laterality: Bilateral;   HIP PINNING,CANNULATED Right 01/19/2022   Procedure: CLOSED REDUCTION PERCUTANEOUS PINNING RIGHT FEMORAL NECK;  Surgeon: Joen Laura, MD;  Location: [facility];  Service: Orthopedics;  Laterality: Right;   LUMBAR DISC SURGERY     PARTIAL HYSTERECTOMY     POLYPECTOMY  12/25/2019   Procedure: POLYPECTOMY;  Surgeon: Corbin Ade, MD;  Location: [facility];  Service: Endoscopy;;  ileocecal valve polyp; descending x2;   TRANSURETHRAL RESECTION OF BLADDER TUMOR N/A 05/05/2018   Procedure: TRANSURETHRAL RESECTION OF BLADDER TUMOR (TURBT);  Surgeon: Malen Gauze, MD;  Location: [facility];  Service: Urology;  Laterality: N/A;   TRANSURETHRAL RESECTION OF BLADDER TUMOR N/A 06/16/2018   Procedure: TRANSURETHRAL RESECTION OF BLADDER TUMOR (TURBT);  Surgeon: Malen Gauze, MD;  Location: [facility];  Service: Urology;  Laterality: N/A;    Current Outpatient Medications  Medication Sig Dispense Refill   acetaminophen (TYLENOL) 500 MG tablet Take 500 mg by mouth every 6 (six) hours as needed for moderate pain.     albuterol (VENTOLIN HFA) 108 (90 Base) MCG/ACT inhaler Inhale 2 puffs into the  lungs every 4 (four) hours as needed for shortness of breath or wheezing.     COLLAGEN PO Take 1 Scoop by mouth 3 (three) times a week.     GARLIC PO Take 1 tablet by mouth once a week.     hydrochlorothiazide (HYDRODIURIL) 12.5 MG tablet TAKE 1 TABLET BY MOUTH EVERY DAY 30 tablet 0   hydrocortisone cream 1 % Apply 1 Application topically daily as needed for itching.     levothyroxine (SYNTHROID) 25 MCG tablet Take 25 mcg by mouth daily before breakfast.     lisinopril (ZESTRIL) 5 MG tablet Take 5 mg by mouth daily.      loratadine (CLARITIN) 10 MG tablet Take 10 mg by mouth daily as needed for allergies.     Melatonin 10 MG TABS Take 10 mg by mouth daily as needed (sleep).     meloxicam (MOBIC) 7.5 MG tablet TAKE 1 TABLET BY MOUTH EVERY DAY AS NEEDED FOR PAIN 30 tablet 5   METAMUCIL FIBER PO Take 2 Scoops by mouth 4 (four) times a week.     Multiple Vitamin (MULTIVITAMIN WITH MINERALS) TABS tablet Take 1 tablet by mouth 2 (two) times a week.     No current facility-administered medications for this visit.    Allergies as of 03/07/2023 - Review Complete 03/07/2023  Allergen Reaction Noted   Verapamil Swelling and Other (See Comments) 07/19/2013    Family History  Problem Relation Age of Onset   Cancer Father        colon cancer; also brother-colon And sister-breast   Heart disease Father    Heart disease Mother        at advanced age   Hypertension Mother    Hyperlipidemia Mother    Osteoporosis Mother    Cancer Brother        liver and colon   Breast cancer Sister    Cancer Sister 78       breast   Breast cancer Sister    Breast cancer Paternal Aunt     Social History   Socioeconomic History   Marital status: Married    Spouse name: Not on file   Number of children: 2   Years of education: Not on file   Highest education level: Not on file  Occupational History   Occupation: Retail banker    Comment: Laundry, Adult nurse  Tobacco Use   Smoking status: Never   Smokeless tobacco: Never  Vaping Use   Vaping status: Never Used  Substance and Sexual Activity   Alcohol use: Yes    Alcohol/week: 2.0 standard drinks of alcohol    Types: 2 Glasses of wine per week    Comment: occ wine   Drug use: No   Sexual activity: Not Currently    Birth control/protection: Post-menopausal, Surgical  Other Topics Concern   Not on file  Social History Narrative   Exercises regularly   Social Determinants of Health   Financial Resource Strain: Not on file  Food Insecurity: Not on file   Transportation Needs: Not on file  Physical Activity: Not on file  Stress: Not on file  Social Connections: Not on file  Intimate Partner Violence: Not on file     Review of Systems   Gen: Denies any fever, chills, fatigue, weight loss, lack of appetite.  CV: Denies chest pain, heart palpitations, peripheral edema, syncope.  Resp: Denies shortness of breath at rest or with exertion. Denies wheezing or cough.  GI: Denies dysphagia  or odynophagia. Denies jaundice, hematemesis, fecal incontinence. GU : Denies urinary burning, urinary frequency, urinary hesitancy MS: Denies joint pain, muscle weakness, cramps, or limitation of movement.  Derm: Denies rash, itching, dry skin Psych: Denies depression, anxiety, memory loss, and confusion Heme: Denies bruising, bleeding, and enlarged lymph nodes.   Physical Exam   BP 137/88 (BP Location: Right Arm, Patient Position: Sitting, Cuff Size: Normal)   Pulse 73   Temp 98.7 F (37.1 C) (Oral)   Ht 5' 6.5" (1.689 m)   Wt 189 lb 6.4 oz (85.9 kg)   SpO2 96%   BMI 30.11 kg/m  General:   Alert and oriented. Pleasant and cooperative. Well-nourished and well-developed.  Head:  Normocephalic and atraumatic. Eyes:  Without icterus Abdomen:  +BS, soft, non-tender and non-distended. No HSM noted. No guarding or rebound. No masses appreciated.  Rectal:  Deferred  Msk:  Symmetrical without gross deformities. Normal posture. Extremities:  Without edema. Neurologic:  Alert and  oriented x4;  grossly normal neurologically. Skin:  Intact without significant lesions or rashes. Psych:  Alert and cooperative. Normal mood and affect.   Assessment   Kayla [name] is a 77 y.o. female presenting today  with a history of constipation, bloating, GERD, adenomas, with need for surveillance colonoscopy.  Constipation is now well managed with dietary changes/supplemental fiber. No concerning signs/symptoms. History of bloating but now denying. Celiac  serologies negative in the past.   GERD: dietary changes have improved symptoms. She will take omeprazole sparingly as needed.   History of multiple adenomas: due for 3 year surveillance now. Also notable family history of colon cancer in both father and brother.      PLAN    Proceed with colonoscopy by Dr. Jena Gauss in near future: the risks, benefits, and alternatives have been discussed with the patient in detail. The patient states understanding and desires to proceed. ASA 2 Continue supplemental fiber and dietary modification   Gelene Mink, PhD, [facility] Gastroenterology

## 2023-03-07 NOTE — Telephone Encounter (Signed)
PA approved via cohere Authorization #[id number], DOS: 03/21/2023 - 05/21/2023

## 2023-03-07 NOTE — Progress Notes (Signed)
Gastroenterology Office Note     Primary Care Physician:  Benita Stabile, MD  Primary Gastroenterologist: Dr. Jena Gauss    Chief Complaint   Chief Complaint  Patient presents with   Colonoscopy     History of Present Illness   Kayla [name] is a 77 y.o. female presenting today with a history of constipation, bloating, and GERD. Celiac serologies negative in the past. Last seen in Sept 2022 and doing well with supplementary fiber and dietary measures for GERD. Colonoscopy due this year (3-year-surveillance) due to history of multiple adenomas. FH colon cancer also in brother and father.   Continues to do well with balanced diet and Metamucil along with lots of water. GERD controlled with dietary measures and will take omeprazole just if needed. No dysphagia. No rectal bleeding. No unintentional weight loss. She has no GI concerns today.    May 2021 colonoscopy: five semi-pedunculated polyps in descending colon and IC valve, 3-7 mm in size. Tubular adenomas. 3 year surveillance.   Both father and brother had colon cancer.    Past Medical History:  Diagnosis Date   Arthritis    Asthma    excercise induced   Benign brain tumor (HCC) 2002   Treated at [facility] with gamma knife   Chest discomfort    GERD (gastroesophageal reflux disease)    No current issues   History of bladder cancer    History of carpal tunnel syndrome    Bilateral   Hypertension    Hypothyroidism    Impaired fasting glucose    Irritable bowel syndrome    Osteopenia    Tinnitus    Venous insufficiency     Past Surgical History:  Procedure Laterality Date   ABDOMINAL HYSTERECTOMY     APPENDECTOMY     BREAST BIOPSY Right    CARPAL TUNNEL RELEASE     Bilateral   COLONOSCOPY N/A 09/15/2014   Dr. Jena Gauss: Multiple colonic polyps removed, tubular adenomas.  Next colonoscopy in 5 years   COLONOSCOPY N/A 12/25/2019   five semi-pedunculated polyps in descending colon and IC valve, 3-7 mm in size. Tubular  adenomas. 3 year surveillance.    CYSTOSCOPY W/ URETERAL STENT PLACEMENT Bilateral 05/05/2018   Procedure: CYSTOSCOPY WITH BILATERAL RETROGRADE PYELOGRAM/ RIGHT URETERAL STENT PLACEMENT;  Surgeon: Malen Gauze, MD;  Location: [facility];  Service: Urology;  Laterality: Bilateral;   HIP PINNING,CANNULATED Right 01/19/2022   Procedure: CLOSED REDUCTION PERCUTANEOUS PINNING RIGHT FEMORAL NECK;  Surgeon: Joen Laura, MD;  Location: [facility];  Service: Orthopedics;  Laterality: Right;   LUMBAR DISC SURGERY     PARTIAL HYSTERECTOMY     POLYPECTOMY  12/25/2019   Procedure: POLYPECTOMY;  Surgeon: Corbin Ade, MD;  [facility]: [facility];  Service: Endoscopy;;  ileocecal valve polyp; descending x2;   TRANSURETHRAL RESECTION OF BLADDER TUMOR N/A 05/05/2018   Procedure: TRANSURETHRAL RESECTION OF BLADDER TUMOR (TURBT);  Surgeon: Malen Gauze, MD;  Location: [facility];  Service: Urology;  Laterality: N/A;   TRANSURETHRAL RESECTION OF BLADDER TUMOR N/A 06/16/2018   Procedure: TRANSURETHRAL RESECTION OF BLADDER TUMOR (TURBT);  Surgeon: Malen Gauze, MD;  Location: [facility];  Service: Urology;  Laterality: N/A;    Current Outpatient Medications  Medication Sig Dispense Refill   acetaminophen (TYLENOL) 500 MG tablet Take 500 mg by mouth every 6 (six) hours as needed for moderate pain.     albuterol (VENTOLIN HFA) 108 (90 Base) MCG/ACT inhaler Inhale 2 puffs into the  lungs every 4 (four) hours as needed for shortness of breath or wheezing.     COLLAGEN PO Take 1 Scoop by mouth 3 (three) times a week.     GARLIC PO Take 1 tablet by mouth once a week.     hydrochlorothiazide (HYDRODIURIL) 12.5 MG tablet TAKE 1 TABLET BY MOUTH EVERY DAY 30 tablet 0   hydrocortisone cream 1 % Apply 1 Application topically daily as needed for itching.     levothyroxine (SYNTHROID) 25 MCG tablet Take 25 mcg by mouth daily before breakfast.     lisinopril (ZESTRIL) 5 MG tablet Take 5 mg by mouth daily.      loratadine (CLARITIN) 10 MG tablet Take 10 mg by mouth daily as needed for allergies.     Melatonin 10 MG TABS Take 10 mg by mouth daily as needed (sleep).     meloxicam (MOBIC) 7.5 MG tablet TAKE 1 TABLET BY MOUTH EVERY DAY AS NEEDED FOR PAIN 30 tablet 5   METAMUCIL FIBER PO Take 2 Scoops by mouth 4 (four) times a week.     Multiple Vitamin (MULTIVITAMIN WITH MINERALS) TABS tablet Take 1 tablet by mouth 2 (two) times a week.     No current facility-administered medications for this visit.    Allergies as of 03/07/2023 - Review Complete 03/07/2023  Allergen Reaction Noted   Verapamil Swelling and Other (See Comments) 07/19/2013    Family History  Problem Relation Age of Onset   Cancer Father        colon cancer; also brother-colon And sister-breast   Heart disease Father    Heart disease Mother        at advanced age   Hypertension Mother    Hyperlipidemia Mother    Osteoporosis Mother    Cancer Brother        liver and colon   Breast cancer Sister    Cancer Sister 78       breast   Breast cancer Sister    Breast cancer Paternal Aunt     Social History   Socioeconomic History   Marital status: Married    Spouse name: Not on file   Number of children: 2   Years of education: Not on file   Highest education level: Not on file  Occupational History   Occupation: Retail banker    Comment: Laundry, Adult nurse  Tobacco Use   Smoking status: Never   Smokeless tobacco: Never  Vaping Use   Vaping status: Never Used  Substance and Sexual Activity   Alcohol use: Yes    Alcohol/week: 2.0 standard drinks of alcohol    Types: 2 Glasses of wine per week    Comment: occ wine   Drug use: No   Sexual activity: Not Currently    Birth control/protection: Post-menopausal, Surgical  Other Topics Concern   Not on file  Social History Narrative   Exercises regularly   Social Determinants of Health   Financial Resource Strain: Not on file  Food Insecurity: Not on file   Transportation Needs: Not on file  Physical Activity: Not on file  Stress: Not on file  Social Connections: Not on file  Intimate Partner Violence: Not on file     Review of Systems   Gen: Denies any fever, chills, fatigue, weight loss, lack of appetite.  CV: Denies chest pain, heart palpitations, peripheral edema, syncope.  Resp: Denies shortness of breath at rest or with exertion. Denies wheezing or cough.  GI: Denies dysphagia  or odynophagia. Denies jaundice, hematemesis, fecal incontinence. GU : Denies urinary burning, urinary frequency, urinary hesitancy MS: Denies joint pain, muscle weakness, cramps, or limitation of movement.  Derm: Denies rash, itching, dry skin Psych: Denies depression, anxiety, memory loss, and confusion Heme: Denies bruising, bleeding, and enlarged lymph nodes.   Physical Exam   BP 137/88 (BP Location: Right Arm, Patient Position: Sitting, Cuff Size: Normal)   Pulse 73   Temp 98.7 F (37.1 C) (Oral)   Ht 5' 6.5" (1.689 m)   Wt 189 lb 6.4 oz (85.9 kg)   SpO2 96%   BMI 30.11 kg/m  General:   Alert and oriented. Pleasant and cooperative. Well-nourished and well-developed.  Head:  Normocephalic and atraumatic. Eyes:  Without icterus Abdomen:  +BS, soft, non-tender and non-distended. No HSM noted. No guarding or rebound. No masses appreciated.  Rectal:  Deferred  Msk:  Symmetrical without gross deformities. Normal posture. Extremities:  Without edema. Neurologic:  Alert and  oriented x4;  grossly normal neurologically. Skin:  Intact without significant lesions or rashes. Psych:  Alert and cooperative. Normal mood and affect.   Assessment   LEVON ASARO is a 77 y.o. female presenting today  with a history of constipation, bloating, GERD, adenomas, with need for surveillance colonoscopy.  Constipation is now well managed with dietary changes/supplemental fiber. No concerning signs/symptoms. History of bloating but now denying. Celiac  serologies negative in the past.   GERD: dietary changes have improved symptoms. She will take omeprazole sparingly as needed.   History of multiple adenomas: due for 3 year surveillance now. Also notable family history of colon cancer in both father and brother.      PLAN    Proceed with colonoscopy by Dr. Jena Gauss in near future: the risks, benefits, and alternatives have been discussed with the patient in detail. The patient states understanding and desires to proceed. ASA 2 Continue supplemental fiber and dietary modification   Gelene Mink, PhD, [facility] Gastroenterology

## 2023-03-07 NOTE — Patient Instructions (Signed)
We are arranging a colonoscopy with Dr. Jena Gauss in the near future!  Further recommendations to follow!  I enjoyed seeing you again today! I value our relationship and want to provide genuine, compassionate, and quality care. You may receive a survey regarding your visit with me, and I welcome your feedback! Thanks so much for taking the time to complete this. I look forward to seeing you again.      Gelene Mink, PhD, ANP-BC [facility] Gastroenterology

## 2023-03-14 ENCOUNTER — Other Ambulatory Visit (HOSPITAL_COMMUNITY)
Admission: RE | Admit: 2023-03-14 | Discharge: 2023-03-14 | Disposition: A | Discharge status: Home / Self Care | Payer: Medicare HMO | Source: Ambulatory Visit | Attending: Internal Medicine | Admitting: Internal Medicine

## 2023-03-14 DIAGNOSIS — Z800 Family history of malignant neoplasm of digestive organs: Secondary | ICD-10-CM | POA: Insufficient documentation

## 2023-03-14 DIAGNOSIS — Z86010 Personal history of colonic polyps: Secondary | ICD-10-CM | POA: Diagnosis not present

## 2023-03-14 LAB — BASIC METABOLIC PANEL
Anion gap: 10 (ref 5–15)
BUN: 20 mg/dL (ref 8–23)
CO2: 29 mmol/L (ref 22–32)
Calcium: 9.2 mg/dL (ref 8.9–10.3)
Chloride: 98 mmol/L (ref 98–111)
Creatinine, Ser: 0.78 mg/dL (ref 0.44–1.00)
GFR, Estimated: >60 mL/min (ref >60)
Glucose, Bld: 108 mg/dL — ABNORMAL HIGH (ref 70–99)
Potassium: 3.6 mmol/L (ref 3.5–5.1)
Sodium: 137 mmol/L (ref 135–145)

## 2023-03-21 ENCOUNTER — Ambulatory Visit (HOSPITAL_COMMUNITY)
Admission: RE | Admit: 2023-03-21 | Discharge: 2023-03-21 | Disposition: A | Discharge status: Home / Self Care | Payer: Medicare HMO | Source: Ambulatory Visit | Attending: Internal Medicine | Admitting: Internal Medicine

## 2023-03-21 ENCOUNTER — Other Ambulatory Visit: Payer: Self-pay

## 2023-03-21 ENCOUNTER — Ambulatory Visit (HOSPITAL_COMMUNITY): Payer: Medicare HMO | Admitting: Anesthesiology

## 2023-03-21 ENCOUNTER — Ambulatory Visit (HOSPITAL_BASED_OUTPATIENT_CLINIC_OR_DEPARTMENT_OTHER): Payer: Medicare HMO | Admitting: Anesthesiology

## 2023-03-21 ENCOUNTER — Encounter (HOSPITAL_COMMUNITY): Payer: Self-pay | Admitting: Internal Medicine

## 2023-03-21 ENCOUNTER — Encounter (HOSPITAL_COMMUNITY)
Admission: RE | Disposition: A | Discharge status: Home / Self Care | Payer: Self-pay | Source: Ambulatory Visit | Attending: Internal Medicine

## 2023-03-21 DIAGNOSIS — J45909 Unspecified asthma, uncomplicated: Secondary | ICD-10-CM | POA: Insufficient documentation

## 2023-03-21 DIAGNOSIS — E039 Hypothyroidism, unspecified: Secondary | ICD-10-CM | POA: Diagnosis not present

## 2023-03-21 DIAGNOSIS — K635 Polyp of colon: Secondary | ICD-10-CM

## 2023-03-21 DIAGNOSIS — Z09 Encounter for follow-up examination after completed treatment for conditions other than malignant neoplasm: Secondary | ICD-10-CM

## 2023-03-21 DIAGNOSIS — K219 Gastro-esophageal reflux disease without esophagitis: Secondary | ICD-10-CM | POA: Insufficient documentation

## 2023-03-21 DIAGNOSIS — Z86010 Personal history of colonic polyps: Secondary | ICD-10-CM | POA: Diagnosis not present

## 2023-03-21 DIAGNOSIS — I10 Essential (primary) hypertension: Secondary | ICD-10-CM | POA: Diagnosis not present

## 2023-03-21 DIAGNOSIS — Z800 Family history of malignant neoplasm of digestive organs: Secondary | ICD-10-CM | POA: Insufficient documentation

## 2023-03-21 DIAGNOSIS — Z1211 Encounter for screening for malignant neoplasm of colon: Secondary | ICD-10-CM

## 2023-03-21 DIAGNOSIS — D125 Benign neoplasm of sigmoid colon: Secondary | ICD-10-CM | POA: Diagnosis not present

## 2023-03-21 HISTORY — PX: COLONOSCOPY WITH PROPOFOL: SHX5780

## 2023-03-21 SURGERY — COLONOSCOPY WITH PROPOFOL
Anesthesia: General

## 2023-03-21 MED ORDER — PROPOFOL 500 MG/50ML IV EMUL
INTRAVENOUS | Status: DC | PRN
  Administered 2023-03-21: 125 ug/kg/min via INTRAVENOUS

## 2023-03-21 MED ORDER — LACTATED RINGERS IV SOLN: INTRAVENOUS | Status: DC

## 2023-03-21 MED ORDER — PROPOFOL 10 MG/ML IV BOLUS
INTRAVENOUS | Status: DC | PRN
  Administered 2023-03-21: 110 mg via INTRAVENOUS

## 2023-03-21 MED ORDER — STERILE WATER FOR IRRIGATION IR SOLN
Status: DC | PRN
  Administered 2023-03-21: 1

## 2023-03-21 NOTE — Anesthesia Preprocedure Evaluation (Signed)
Anesthesia Evaluation  Patient identified by MRN, date of birth, ID band Patient awake    Reviewed: Allergy & Precautions, H&P , NPO status , Patient's Chart, lab work & pertinent test results, reviewed documented beta blocker date and time   Airway Mallampati: II  TM Distance: >3 FB Neck ROM: full    Dental no notable dental hx.    Pulmonary neg pulmonary ROS, asthma    Pulmonary exam normal breath sounds clear to auscultation       Cardiovascular Exercise Tolerance: Good hypertension, negative cardio ROS  Rhythm:regular Rate:Normal     Neuro/Psych negative neurological ROS  negative psych ROS   GI/Hepatic negative GI ROS, Neg liver ROS,GERD  ,,  Endo/Other  negative endocrine ROSHypothyroidism    Renal/GU negative Renal ROS  negative genitourinary   Musculoskeletal   Abdominal   Peds  Hematology negative hematology ROS (+)   Anesthesia Other Findings   Reproductive/Obstetrics negative OB ROS                             Anesthesia Physical Anesthesia Plan  ASA: 2  Anesthesia Plan: General   Post-op Pain Management:    Induction:   PONV Risk Score and Plan: Propofol infusion  Airway Management Planned:   Additional Equipment:   Intra-op Plan:   Post-operative Plan:   Informed Consent: I have reviewed the patients History and Physical, chart, labs and discussed the procedure including the risks, benefits and alternatives for the proposed anesthesia with the patient or authorized representative who has indicated his/her understanding and acceptance.     Dental Advisory Given  Plan Discussed with: CRNA  Anesthesia Plan Comments:        Anesthesia Quick Evaluation

## 2023-03-21 NOTE — Anesthesia Procedure Notes (Signed)
Date/Time: 03/21/2023 10:21 AM  Performed by: Franco Nones, CRNAPre-anesthesia Checklist: Patient identified, Emergency Drugs available, Suction available, Timeout performed and Patient being monitored Patient Re-evaluated:Patient Re-evaluated prior to induction Oxygen Delivery Method: Nasal Cannula

## 2023-03-21 NOTE — Discharge Instructions (Signed)
  Colonoscopy Discharge Instructions  Read the instructions outlined below and refer to this sheet in the next few weeks. These discharge instructions provide you with general information on caring for yourself after you leave the hospital. Your doctor may also give you specific instructions. While your treatment has been planned according to the most current medical practices available, unavoidable complications occasionally occur. If you have any problems or questions after discharge, call Dr. Jena Gauss at [phone]. ACTIVITY You may resume your regular activity, but move at a slower pace for the next 24 hours.  Take frequent rest periods for the next 24 hours.  Walking will help get rid of the air and reduce the bloated feeling in your belly (abdomen).  No driving for 24 hours (because of the medicine (anesthesia) used during the test).   Do not sign any important legal documents or operate any machinery for 24 hours (because of the anesthesia used during the test).  NUTRITION Drink plenty of fluids.  You may resume your normal diet as instructed by your doctor.  Begin with a light meal and progress to your normal diet. Heavy or fried foods are harder to digest and may make you feel sick to your stomach (nauseated).  Avoid alcoholic beverages for 24 hours or as instructed.  MEDICATIONS You may resume your normal medications unless your doctor tells you otherwise.  WHAT YOU CAN EXPECT TODAY Some feelings of bloating in the abdomen.  Passage of more gas than usual.  Spotting of blood in your stool or on the toilet paper.  IF YOU HAD POLYPS REMOVED DURING THE COLONOSCOPY: No aspirin products for 7 days or as instructed.  No alcohol for 7 days or as instructed.  Eat a soft diet for the next 24 hours.  FINDING OUT THE RESULTS OF YOUR TEST Not all test results are available during your visit. If your test results are not back during the visit, make an appointment with your caregiver to find out the  results. Do not assume everything is normal if you have not heard from your caregiver or the medical facility. It is important for you to follow up on all of your test results.  SEEK IMMEDIATE MEDICAL ATTENTION IF: You have more than a spotting of blood in your stool.  Your belly is swollen (abdominal distention).  You are nauseated or vomiting.  You have a temperature over 101.  You have abdominal pain or discomfort that is severe or gets worse throughout the day.     1 small polyp found and removed today   further recommendations to follow pending review of pathology report   at patient request, I called Maxwell Caul at [phone] rolled to voicemail-left a message

## 2023-03-21 NOTE — Op Note (Signed)
[facility] Patient Name: Kayla [name] Procedure Date: 03/21/2023 10:09 AM MRN: [id number] Date of Birth: 07/30/1946 Attending MD: Gennette Pac , MD, [id number] CSN: [id number] Age: 77 Admit Type: Outpatient Procedure:                Colonoscopy Indications:              High risk colon cancer surveillance: Personal                            history of colonic polyps Providers:                Gennette Pac, MD, Enzo Montgomery RN, RN,                            Elinor Parkinson, Dyann Ruddle Referring MD:              Medicines:                Propofol per Anesthesia Complications:            No immediate complications. Estimated Blood Loss:     Estimated blood loss was minimal. Procedure:                Pre-Anesthesia Assessment:                           - Prior to the procedure, a History and Physical                            was performed, and patient medications and                            allergies were reviewed. The patient's tolerance of                            previous anesthesia was also reviewed. The risks                            and benefits of the procedure and the sedation                            options and risks were discussed with the patient.                            All questions were answered, and informed consent                            was obtained. Prior Anticoagulants: The patient has                            taken no anticoagulant or antiplatelet agents. ASA                            Grade Assessment: II - A patient with mild systemic  disease. After reviewing the risks and benefits,                            the patient was deemed in satisfactory condition to                            undergo the procedure.                           After obtaining informed consent, the colonoscope                            was passed under direct vision. Throughout the                             procedure, the patient's blood pressure, pulse, and                            oxygen saturations were monitored continuously. The                            [phone]) scope was introduced through the                            anus and advanced to the the cecum, identified by                            appendiceal orifice and ileocecal valve. The                            colonoscopy was performed without difficulty. The                            patient tolerated the procedure well. The quality                            of the bowel preparation was good. The ileocecal                            valve, appendiceal orifice, and rectum were                            photographed. The entire colon was well visualized. Scope In: 10:28:57 AM Scope Out: 10:46:29 AM Scope Withdrawal Time: 0 hours 10 minutes 51 seconds  Total Procedure Duration: 0 hours 17 minutes 32 seconds  Findings:      The perianal and digital rectal examinations were normal.      A 5 mm polyp was found in the mid sigmoid colon. The polyp was sessile.       The polyp was removed with a cold snare. Resection and retrieval were       complete. Estimated blood loss was minimal.      The exam was otherwise without abnormality on direct and retroflexion       views. Impression:               -  One 5 mm polyp in the mid sigmoid colon, removed                            with a cold snare. Resected and retrieved.                           - The examination was otherwise normal on direct                            and retroflexion views. Moderate Sedation:      Moderate (conscious) sedation was personally administered by an       anesthesia professional. The following parameters were monitored: oxygen       saturation, heart rate, blood pressure, respiratory rate, EKG, adequacy       of pulmonary ventilation, and response to care. Recommendation:           - Patient has a contact number available for                             emergencies. The signs and symptoms of potential                            delayed complications were discussed with the                            patient. Return to normal activities tomorrow.                            Written discharge instructions were provided to the                            patient.                           - Advance diet as tolerated.                           - Continue present medications.                           - Repeat colonoscopy date to be determined after                            pending pathology results are reviewed for                            surveillance based on pathology results.                           - Return to GI office (date not yet determined). Procedure Code(s):        --- Professional ---                           [phone], Colonoscopy, flexible; with removal of  tumor(s), polyp(s), or other lesion(s) by snare                            technique Diagnosis Code(s):        --- Professional ---                           Z86.010, Personal history of colonic polyps                           D12.5, Benign neoplasm of sigmoid colon CPT copyright 2022 American Medical Association. All rights reserved. The codes documented in this report are preliminary and upon coder review may  be revised to meet current compliance requirements. Gerrit Friends. Raelynne Ludwick, MD Gennette Pac, MD 03/21/2023 10:57:21 AM This report has been signed electronically. Number of Addenda: 0

## 2023-03-21 NOTE — Transfer of Care (Signed)
Immediate Anesthesia Transfer of Care Note  Patient: Kayla [name]  Procedure(s) Performed: COLONOSCOPY WITH PROPOFOL  Patient Location: Endoscopy Unit  Anesthesia Type:General  Level of Consciousness: awake and patient cooperative  Airway & Oxygen Therapy: Patient Spontanous Breathing  Post-op Assessment: Report given to RN and Post -op Vital signs reviewed and stable  Post vital signs: Reviewed and stable  Last Vitals:  Vitals Value Taken Time  BP 96/62 03/21/23 1051  Temp 36.6 C 03/21/23 1051  Pulse 66 03/21/23 1051  Resp 21 03/21/23 1051  SpO2 98 % 03/21/23 1051    Last Pain:  Vitals:   03/21/23 1051  TempSrc: Oral  PainSc: 0-No pain      Patients Stated Pain Goal: 8 (03/21/23 0920)  Complications: No notable events documented.

## 2023-03-21 NOTE — Interval H&P Note (Signed)
History and Physical Interval Note:  03/21/2023 10:16 AM  Kayla [name]  has presented today for surgery, with the diagnosis of HX POLYPS, FHx colon cancer.  The various methods of treatment have been discussed with the patient and family. After consideration of risks, benefits and other options for treatment, the patient has consented to  Procedure(s) with comments: COLONOSCOPY WITH PROPOFOL (N/A) - 10:30am, asa 2 as a surgical intervention.  The patient's history has been reviewed, patient examined, no change in status, stable for surgery.  I have reviewed the patient's chart and labs.  Questions were answered to the patient's satisfaction.     Kayla [name]     No change.  Surveillance colonoscopy today per plan.The risks, benefits, limitations, alternatives and imponderables have been reviewed with the patient. Questions have been answered. All parties are agreeable.

## 2023-03-22 LAB — SURGICAL PATHOLOGY

## 2023-03-23 NOTE — Anesthesia Postprocedure Evaluation (Signed)
Anesthesia Post Note  Patient: Kayla [name]  Procedure(s) Performed: COLONOSCOPY WITH PROPOFOL  Patient location during evaluation: Phase II Anesthesia Type: General Level of consciousness: awake Pain management: pain level controlled Vital Signs Assessment: post-procedure vital signs reviewed and stable Respiratory status: spontaneous breathing and respiratory function stable Cardiovascular status: blood pressure returned to baseline and stable Postop Assessment: no headache and no apparent nausea or vomiting Anesthetic complications: no Comments: Late entry   No notable events documented.   Last Vitals:  Vitals:   03/21/23 0920 03/21/23 1051  BP: (!) 148/80 96/62  Pulse: 63 66  Resp:  (!) 21  Temp: 36.5 C 36.6 C  SpO2: 98% 98%    Last Pain:  Vitals:   03/21/23 1051  TempSrc: Oral  PainSc: 0-No pain                 Windell Norfolk

## 2023-03-24 ENCOUNTER — Encounter: Payer: Self-pay | Admitting: Internal Medicine

## 2023-03-25 ENCOUNTER — Encounter (HOSPITAL_COMMUNITY): Payer: Self-pay | Admitting: Internal Medicine

## 2023-04-16 ENCOUNTER — Other Ambulatory Visit (HOSPITAL_COMMUNITY): Payer: Self-pay | Admitting: Family Medicine

## 2023-04-16 DIAGNOSIS — R42 Dizziness and giddiness: Secondary | ICD-10-CM | POA: Diagnosis not present

## 2023-04-16 DIAGNOSIS — Z79899 Other long term (current) drug therapy: Secondary | ICD-10-CM | POA: Diagnosis not present

## 2023-04-16 DIAGNOSIS — Z86011 Personal history of benign neoplasm of the brain: Secondary | ICD-10-CM | POA: Diagnosis not present

## 2023-04-16 DIAGNOSIS — R519 Headache, unspecified: Secondary | ICD-10-CM

## 2023-04-16 DIAGNOSIS — M79673 Pain in unspecified foot: Secondary | ICD-10-CM | POA: Diagnosis not present

## 2023-04-30 ENCOUNTER — Other Ambulatory Visit (HOSPITAL_COMMUNITY): Payer: Self-pay | Admitting: Internal Medicine

## 2023-04-30 DIAGNOSIS — Z1231 Encounter for screening mammogram for malignant neoplasm of breast: Secondary | ICD-10-CM

## 2023-05-13 ENCOUNTER — Ambulatory Visit (HOSPITAL_COMMUNITY)
Admission: RE | Admit: 2023-05-13 | Discharge: 2023-05-13 | Disposition: A | Discharge status: Home / Self Care | Payer: Medicare HMO | Source: Ambulatory Visit | Attending: Family Medicine | Admitting: Family Medicine

## 2023-05-13 DIAGNOSIS — R42 Dizziness and giddiness: Secondary | ICD-10-CM | POA: Diagnosis not present

## 2023-05-13 DIAGNOSIS — R519 Headache, unspecified: Secondary | ICD-10-CM | POA: Insufficient documentation

## 2023-05-15 DIAGNOSIS — I10 Essential (primary) hypertension: Secondary | ICD-10-CM | POA: Diagnosis not present

## 2023-05-15 DIAGNOSIS — R7303 Prediabetes: Secondary | ICD-10-CM | POA: Diagnosis not present

## 2023-05-15 DIAGNOSIS — E039 Hypothyroidism, unspecified: Secondary | ICD-10-CM | POA: Diagnosis not present

## 2023-05-22 DIAGNOSIS — E039 Hypothyroidism, unspecified: Secondary | ICD-10-CM | POA: Diagnosis not present

## 2023-05-22 DIAGNOSIS — R7303 Prediabetes: Secondary | ICD-10-CM | POA: Diagnosis not present

## 2023-05-22 DIAGNOSIS — E782 Mixed hyperlipidemia: Secondary | ICD-10-CM | POA: Diagnosis not present

## 2023-05-22 DIAGNOSIS — R809 Proteinuria, unspecified: Secondary | ICD-10-CM | POA: Diagnosis not present

## 2023-05-22 DIAGNOSIS — I10 Essential (primary) hypertension: Secondary | ICD-10-CM | POA: Diagnosis not present

## 2023-05-22 DIAGNOSIS — B372 Candidiasis of skin and nail: Secondary | ICD-10-CM | POA: Diagnosis not present

## 2023-05-22 DIAGNOSIS — Z23 Encounter for immunization: Secondary | ICD-10-CM | POA: Diagnosis not present

## 2023-05-22 DIAGNOSIS — C679 Malignant neoplasm of bladder, unspecified: Secondary | ICD-10-CM | POA: Diagnosis not present

## 2023-05-22 DIAGNOSIS — Z0001 Encounter for general adult medical examination with abnormal findings: Secondary | ICD-10-CM | POA: Diagnosis not present

## 2023-05-31 ENCOUNTER — Ambulatory Visit (HOSPITAL_COMMUNITY)
Admission: RE | Admit: 2023-05-31 | Discharge: 2023-05-31 | Disposition: A | Discharge status: Home / Self Care | Payer: Medicare HMO | Source: Ambulatory Visit | Attending: Internal Medicine | Admitting: Internal Medicine

## 2023-05-31 DIAGNOSIS — Z1231 Encounter for screening mammogram for malignant neoplasm of breast: Secondary | ICD-10-CM | POA: Insufficient documentation

## 2023-08-06 ENCOUNTER — Other Ambulatory Visit: Payer: Self-pay

## 2023-08-06 DIAGNOSIS — C672 Malignant neoplasm of lateral wall of bladder: Secondary | ICD-10-CM

## 2023-08-07 ENCOUNTER — Inpatient Hospital Stay: Payer: Medicare HMO | Attending: Hematology

## 2023-08-07 ENCOUNTER — Inpatient Hospital Stay: Payer: Medicare HMO | Admitting: Hematology

## 2023-08-07 VITALS — BP 126/96 | HR 91 | Temp 98.2°F | Resp 18 | Wt 186.1 lb

## 2023-08-07 DIAGNOSIS — D751 Secondary polycythemia: Secondary | ICD-10-CM | POA: Insufficient documentation

## 2023-08-07 DIAGNOSIS — Z803 Family history of malignant neoplasm of breast: Secondary | ICD-10-CM | POA: Diagnosis not present

## 2023-08-07 DIAGNOSIS — C672 Malignant neoplasm of lateral wall of bladder: Secondary | ICD-10-CM | POA: Diagnosis not present

## 2023-08-07 DIAGNOSIS — Z800 Family history of malignant neoplasm of digestive organs: Secondary | ICD-10-CM | POA: Diagnosis not present

## 2023-08-07 DIAGNOSIS — D582 Other hemoglobinopathies: Secondary | ICD-10-CM | POA: Diagnosis not present

## 2023-08-07 DIAGNOSIS — Z8052 Family history of malignant neoplasm of bladder: Secondary | ICD-10-CM | POA: Insufficient documentation

## 2023-08-07 DIAGNOSIS — Z79899 Other long term (current) drug therapy: Secondary | ICD-10-CM | POA: Diagnosis not present

## 2023-08-07 DIAGNOSIS — Z801 Family history of malignant neoplasm of trachea, bronchus and lung: Secondary | ICD-10-CM | POA: Insufficient documentation

## 2023-08-07 LAB — COMPREHENSIVE METABOLIC PANEL
ALT: 18 U/L (ref 0–44)
AST: 18 U/L (ref 15–41)
Albumin: 4.4 g/dL (ref 3.5–5.0)
Alkaline Phosphatase: 60 U/L (ref 38–126)
Anion gap: 9 (ref 5–15)
BUN: 19 mg/dL (ref 8–23)
CO2: 26 mmol/L (ref 22–32)
Calcium: 9.4 mg/dL (ref 8.9–10.3)
Chloride: 101 mmol/L (ref 98–111)
Creatinine, Ser: 0.87 mg/dL (ref 0.44–1.00)
GFR, Estimated: >60 mL/min (ref >60)
Glucose, Bld: 106 mg/dL — ABNORMAL HIGH (ref 70–99)
Potassium: 3.9 mmol/L (ref 3.5–5.1)
Sodium: 136 mmol/L (ref 135–145)
Total Bilirubin: 0.9 mg/dL (ref 0.0–1.2)
Total Protein: 7 g/dL (ref 6.5–8.1)

## 2023-08-07 LAB — CBC WITH DIFFERENTIAL/PLATELET
Abs Immature Granulocytes: 0.02 10*3/uL (ref 0.00–0.07)
Basophils Absolute: 0.1 10*3/uL (ref 0.0–0.1)
Basophils Relative: 1 %
Eosinophils Absolute: 0.2 10*3/uL (ref 0.0–0.5)
Eosinophils Relative: 2 %
HCT: 46.3 % — ABNORMAL HIGH (ref 36.0–46.0)
Hemoglobin: 15.6 g/dL — ABNORMAL HIGH (ref 12.0–15.0)
Immature Granulocytes: 0 %
Lymphocytes Relative: 35 %
Lymphs Abs: 2.5 10*3/uL (ref 0.7–4.0)
MCH: 30.4 pg (ref 26.0–34.0)
MCHC: 33.7 g/dL (ref 30.0–36.0)
MCV: 90.1 fL (ref 80.0–100.0)
Monocytes Absolute: 0.3 10*3/uL (ref 0.1–1.0)
Monocytes Relative: 5 %
Neutro Abs: 3.9 10*3/uL (ref 1.7–7.7)
Neutrophils Relative %: 57 %
Platelets: 196 10*3/uL (ref 150–400)
RBC: 5.14 MIL/uL — ABNORMAL HIGH (ref 3.87–5.11)
RDW: 12 % (ref 11.5–15.5)
WBC: 6.9 10*3/uL (ref 4.0–10.5)
nRBC: 0 % (ref 0.0–0.2)

## 2023-08-07 NOTE — Progress Notes (Signed)
 [facility] 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 Cheese 78 y.o. female seen for follow-up of nonmuscle invasive high-grade bladder cancer.  Denies any hematuria.  Reports appetite 100% and energy levels of 75%.    REVIEW OF SYSTEMS:  Review of Systems  Respiratory:  Positive for shortness of breath.   Cardiovascular:  Positive for palpitations.  Neurological:  Positive for headaches.  Psychiatric/Behavioral:  Positive for sleep disturbance.   All other systems reviewed and are negative.    PAST MEDICAL/SURGICAL HISTORY:  Past Medical History:  Diagnosis Date   Arthritis    Asthma    excercise induced   Benign brain tumor (HCC) 2002   Treated at [facility] with gamma knife   Chest discomfort    GERD (gastroesophageal reflux disease)    No current issues   History of bladder cancer    History of  carpal tunnel syndrome    Bilateral   Hypertension    Hypothyroidism    Impaired fasting glucose    Irritable bowel syndrome    Osteopenia    Tinnitus    Venous insufficiency    Past Surgical History:  Procedure Laterality Date   ABDOMINAL HYSTERECTOMY     APPENDECTOMY     BREAST BIOPSY Right    CARPAL TUNNEL RELEASE     Bilateral   COLONOSCOPY N/A 09/15/2014   Dr. Shaaron: Multiple colonic polyps removed, tubular adenomas.  Next colonoscopy in 5 years   COLONOSCOPY N/A 12/25/2019   five semi-pedunculated polyps in descending colon and IC valve, 3-7 mm in size. Tubular adenomas. 3 year surveillance.    COLONOSCOPY WITH PROPOFOL  N/A 03/21/2023   Procedure: COLONOSCOPY WITH PROPOFOL ;  Surgeon: Shaaron Lamar HERO, MD;  Location: [facility];  Service: Endoscopy;  Laterality: N/A;  10:30am, asa 2   CYSTOSCOPY W/ URETERAL STENT PLACEMENT Bilateral 05/05/2018   Procedure: CYSTOSCOPY WITH BILATERAL RETROGRADE PYELOGRAM/ RIGHT URETERAL STENT PLACEMENT;  Surgeon: Sherrilee Belvie CROME, MD;  Location: [facility] ORS;  Service: Urology;  Laterality: Bilateral;   HIP PINNING,CANNULATED Right 01/19/2022   Procedure: CLOSED REDUCTION PERCUTANEOUS PINNING RIGHT FEMORAL NECK;  Surgeon: Edna Toribio LABOR, MD;  Location: [facility];  Service: Orthopedics;  Laterality: Right;   LUMBAR DISC SURGERY     PARTIAL HYSTERECTOMY     POLYPECTOMY  12/25/2019   Procedure: POLYPECTOMY;  Surgeon: Shaaron Lamar HERO, MD;  Location: [facility];  Service: Endoscopy;;  ileocecal valve polyp; descending  x2;   TRANSURETHRAL RESECTION OF BLADDER TUMOR N/A 05/05/2018   Procedure: TRANSURETHRAL RESECTION OF BLADDER TUMOR (TURBT);  Surgeon: Sherrilee Belvie CROME, MD;  Location: [facility];  Service: Urology;  Laterality: N/A;   TRANSURETHRAL RESECTION OF BLADDER TUMOR N/A 06/16/2018   Procedure: TRANSURETHRAL RESECTION OF BLADDER TUMOR (TURBT);  Surgeon: Sherrilee Belvie CROME, MD;  Location: [facility];  Service: Urology;  Laterality: N/A;     SOCIAL  HISTORY:  Social History   Socioeconomic History   Marital status: Married    Spouse name: Not on file   Number of children: 2   Years of education: Not on file   Highest education level: Not on file  Occupational History   Occupation: Retail banker    Comment: Laundry, adult nurse  Tobacco Use   Smoking status: Never   Smokeless tobacco: Never  Vaping Use   Vaping status: Never Used  Substance and Sexual Activity   Alcohol  use: Yes    Alcohol /week: 2.0 standard drinks of alcohol     Types: 2 Glasses of wine per week    Comment: occ wine   Drug use: No   Sexual activity: Not Currently    Birth control/protection: Post-menopausal, Surgical  Other Topics Concern   Not on file  Social History Narrative   Exercises regularly   Social Drivers of Health   Financial Resource Strain: Not on file  Food Insecurity: Not on file  Transportation Needs: Not on file  Physical Activity: Not on file  Stress: Not on file  Social Connections: Not on file  Intimate Partner Violence: Not on file    FAMILY HISTORY:  Family History  Problem Relation Age of Onset   Cancer Father        colon cancer; also brother-colon And sister-breast   Heart disease Father    Heart disease Mother        at advanced age   Hypertension Mother    Hyperlipidemia Mother    Osteoporosis Mother    Cancer Brother        liver and colon   Breast cancer Sister    Cancer Sister 18       breast   Breast cancer Sister    Breast cancer Paternal Aunt     CURRENT MEDICATIONS:  Outpatient Encounter Medications as of 08/07/2023  Medication Sig   acetaminophen  (TYLENOL ) 500 MG tablet Take 500 mg by mouth every 6 (six) hours as needed for moderate pain.   albuterol  (VENTOLIN  HFA) 108 (90 Base) MCG/ACT inhaler Inhale 2 puffs into the lungs every 4 (four) hours as needed for shortness of breath or wheezing.   COLLAGEN PO Take 1 Scoop by mouth 3 (three) times a week.   GARLIC PO Take 1 tablet by mouth once a  week.   hydrochlorothiazide  (HYDRODIURIL ) 12.5 MG tablet TAKE 1 TABLET BY MOUTH EVERY DAY   hydrocortisone cream 1 % Apply 1 Application topically daily as needed for itching.   levothyroxine (SYNTHROID) 25 MCG tablet Take 25 mcg by mouth daily before breakfast.   lisinopril  (ZESTRIL ) 5 MG tablet Take 5 mg by mouth daily.   loratadine (CLARITIN) 10 MG tablet Take 10 mg by mouth daily as needed for allergies.   Melatonin 10 MG TABS Take 10 mg by mouth daily as needed (sleep).   meloxicam  (MOBIC ) 7.5 MG tablet TAKE 1 TABLET BY MOUTH EVERY DAY AS NEEDED FOR PAIN   METAMUCIL FIBER PO Take 2 Scoops by mouth 4 (four) times a week.  Multiple Vitamin (MULTIVITAMIN WITH MINERALS) TABS tablet Take 1 tablet by mouth 2 (two) times a week.   omeprazole (PRILOSEC) 20 MG capsule Take 20 mg by mouth daily.   No facility-administered encounter medications on file as of 08/07/2023.    ALLERGIES:  Allergies  Allergen Reactions   Verapamil Swelling and Other (See Comments)    Swelling of leg     PHYSICAL EXAM:  ECOG Performance status: 1  Vitals:   08/07/23 1134  BP: (!) 126/96  Pulse: 91  Resp: 18  Temp: 98.2 F (36.8 C)  SpO2: 94%   Filed Weights   08/07/23 1134  Weight: 186 lb 1.1 oz (84.4 kg)   Physical Exam Vitals reviewed.  Constitutional:      Appearance: Normal appearance.  Cardiovascular:     Rate and Rhythm: Normal rate and regular rhythm.     Heart sounds: Normal heart sounds.  Pulmonary:     Effort: Pulmonary effort is normal.     Breath sounds: Normal breath sounds.  Neurological:     Mental Status: She is alert.  Psychiatric:        Mood and Affect: Mood normal.        Behavior: Behavior normal.      LABORATORY DATA:  I have reviewed the labs as listed.  CBC    Component Value Date/Time   WBC 6.9 08/07/2023 1030   RBC 5.14 (H) 08/07/2023 1030   HGB 15.6 (H) 08/07/2023 1030   HCT 46.3 (H) 08/07/2023 1030   PLT 196 08/07/2023 1030   MCV 90.1 08/07/2023 1030    MCH 30.4 08/07/2023 1030   MCHC 33.7 08/07/2023 1030   RDW 12.0 08/07/2023 1030   LYMPHSABS 2.5 08/07/2023 1030   MONOABS 0.3 08/07/2023 1030   EOSABS 0.2 08/07/2023 1030   BASOSABS 0.1 08/07/2023 1030      Latest Ref Rng & Units 08/07/2023   10:30 AM 03/14/2023    9:03 AM 07/11/2022    3:10 PM  CMP  Glucose 70 - 99 mg/dL 893  891  876   BUN 8 - 23 mg/dL 19  20  17    Creatinine 0.44 - 1.00 mg/dL 9.12  9.21  9.12   Sodium 135 - 145 mmol/L 136  137  137   Potassium 3.5 - 5.1 mmol/L 3.9  3.6  3.7   Chloride 98 - 111 mmol/L 101  98  103   CO2 22 - 32 mmol/L 26  29  25    Calcium 8.9 - 10.3 mg/dL 9.4  9.2  9.2   Total Protein 6.5 - 8.1 g/dL 7.0   6.7   Total Bilirubin 0.0 - 1.2 mg/dL 0.9   0.5   Alkaline Phos 38 - 126 U/L 60   55   AST 15 - 41 U/L 18   20   ALT 0 - 44 U/L 18   23     DIAGNOSTIC IMAGING:  I have independently reviewed the scans and discussed with the patient.  ASSESSMENT: Nonmuscle invasive bladder cancer: - CTAP with and without contrast on 04/09/2018 showed 2.8 x 4.1 cm exophytic mass in the posterior right bladder near the UVJ.  No pelvic sidewall lymphadenopathy.  Upper normal to borderline lymph nodes seen in the hepatoduodenal ligament, atypical for bladder cancer in the absence of pelvic lymphadenopathy.  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 - Cystoscopy and transurethral resection on 05/05/2018. 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  Carcinoma invades lamina propria in the level below muscularis mucosa.  LVI +.  Muscularis propria is present and not involved. - She is started on intravesical BCG around January 2020. - She is currently receiving BCG weekly for 3 weeks every 6 months under the direction of Dr. Sherrilee.  Cystoscopy on 11/22/2021: No evidence of tumor    Social/family history: - She lives at home with her husband.  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  She is non-smoker.  She also worked on a tobacco farm. - Father had colon cancer.  Brother had colon cancer.  Another brother had lung cancer.  2 sisters had breast cancer.  1 sister had bladder cancer.    PLAN:  1.  High-grade nonmuscle invasive bladder cancer: - She denies any hematuria. - She will proceed with cystoscopy in April 2025 with Dr. Sherrilee. - No need to follow-up with me for this problem.  2.  Erythrocytosis: - She has elevated hemoglobin and hematocrit with elevated RBC count on 08/07/2023.  She reported that his previous labs at his PMD also showed the same. - She has been on HCTZ for 10 years.  She denies history of sleep apnea.  However she feels sleepy during daytime and takes naps. - She is a non-smoker.  Denies aqua genic pruritus or erythromelalgia's.  No prior history of thrombosis. - Recommend further workup with serum EPO level, JAK2 V617F with reflex testing.  Will also make referral for sleep study. - RTC 4 weeks for follow-up.      Orders placed this encounter:  Orders Placed This Encounter  Procedures   Erythropoietin    JAK2 V617F rfx CALR/MPL/E12-15      Alean Stands, MD [facility] [phone]

## 2023-08-07 NOTE — Patient Instructions (Addendum)
 [facility] at [facility] Discharge Instructions   You were seen and examined today by Dr. Rogers.  He reviewed your lab work. Your hemoglobin is high. We will do additional blood work today to investigate this.   Return as scheduled.    Thank you for choosing [facility] at [facility] to provide your oncology and hematology care.  To afford each patient quality time with our provider, please arrive at least 15 minutes before your scheduled appointment time.   If you have a lab appointment with the Cancer Center please come in thru the Main Entrance and check in at the main information desk.  You need to re-schedule your appointment should you arrive 10 or more minutes late.  We strive to give you quality time with our providers, and arriving late affects you and other patients whose appointments are after yours.  Also, if you no show three or more times for appointments you may be dismissed from the clinic at the providers discretion.     Again, thank you for choosing [facility].  Our hope is that these requests will decrease the amount of time that you wait before being seen by our physicians.       _____________________________________________________________  Should you have questions after your visit to [facility], please contact our office at [phone] and follow the prompts.  Our office hours are 8:00 a.m. and 4:30 p.m. Monday - Friday.  Please note that voicemails left after 4:00 p.m. may not be returned until the following business day.  We are closed weekends and major holidays.  You do have access to a nurse 24-7, just call the main number to the clinic [phone] and do not press any options, hold on the line and a nurse will answer the phone.    For prescription refill requests, have your pharmacy contact our office and allow 72 hours.    Due to Covid, you will need to wear a mask upon entering  the hospital. If you do not have a mask, a mask will be given to you at the Main Entrance upon arrival. For doctor visits, patients may have 1 support person age 77 or older with them. For treatment visits, patients can not have anyone with them due to social distancing guidelines and our immunocompromised population.

## 2023-08-08 LAB — ERYTHROPOIETIN: Erythropoietin: 6.8 m[IU]/mL (ref 2.6–18.5)

## 2023-08-13 LAB — JAK2 V617F RFX CALR/MPL/E12-15

## 2023-08-13 LAB — CALR +MPL + E12-E15  (REFLEX)

## 2023-09-04 ENCOUNTER — Inpatient Hospital Stay: Payer: Medicare HMO | Attending: Oncology | Admitting: Oncology

## 2023-09-04 ENCOUNTER — Encounter: Payer: Self-pay | Admitting: Oncology

## 2023-09-04 VITALS — BP 122/78 | HR 82 | Temp 97.5°F | Resp 20 | Wt 188.7 lb

## 2023-09-04 DIAGNOSIS — Z8052 Family history of malignant neoplasm of bladder: Secondary | ICD-10-CM | POA: Insufficient documentation

## 2023-09-04 DIAGNOSIS — R350 Frequency of micturition: Secondary | ICD-10-CM | POA: Insufficient documentation

## 2023-09-04 DIAGNOSIS — Z803 Family history of malignant neoplasm of breast: Secondary | ICD-10-CM | POA: Diagnosis not present

## 2023-09-04 DIAGNOSIS — Z79899 Other long term (current) drug therapy: Secondary | ICD-10-CM | POA: Diagnosis not present

## 2023-09-04 DIAGNOSIS — D751 Secondary polycythemia: Secondary | ICD-10-CM | POA: Diagnosis not present

## 2023-09-04 DIAGNOSIS — C672 Malignant neoplasm of lateral wall of bladder: Secondary | ICD-10-CM

## 2023-09-04 DIAGNOSIS — Z800 Family history of malignant neoplasm of digestive organs: Secondary | ICD-10-CM | POA: Insufficient documentation

## 2023-09-04 DIAGNOSIS — Z801 Family history of malignant neoplasm of trachea, bronchus and lung: Secondary | ICD-10-CM | POA: Diagnosis not present

## 2023-09-04 NOTE — Progress Notes (Signed)
[facility] [address] [place], [place] [address]   CLINIC:  Medical Oncology/Hematology  PCP:  Benita Stabile, MD [address] 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  PRIOR THERAPY: BCG treatments completed in April 2023  NGS Results: Not done  CURRENT THERAPY: Observation  BRIEF ONCOLOGIC HISTORY:  Oncology History  Malignant neoplasm of lateral wall of bladder (HCC)  10/07/2019 Initial Diagnosis   Malignant neoplasm of lateral wall of bladder (HCC)   07/19/2021 Cancer Staging   Staging form: Urinary Bladder, AJCC 8th Edition - Clinical stage from 07/19/2021: Stage I (ycT1, cN0, cM0) - Signed by Doreatha Massed, MD on 07/19/2021 Stage prefix: Post-therapy WHO/ISUP grade (low/high): High Grade Histologic grading system: 2 grade system     CANCER STAGING:  Cancer Staging  Malignant neoplasm of lateral wall of bladder (HCC) Staging form: Urinary Bladder, AJCC 8th Edition - Clinical stage from 07/19/2021: Stage I (ycT1, cN0, cM0) - Signed by Doreatha Massed, MD on 07/19/2021   INTERVAL HISTORY:  Kayla [name] 78 y.o. female seen for follow-up for erythrocytosis.  Has history of known muscle invasive high-grade bladder cancer.   She was found to have mild erythrocytosis at her last visit. Kayla [name] wanted to rule out JAK2 testing along with several other labs.  She is here for follow-up and to review lab work.  Reports that she has trouble sleeping due to urge to urinate every 3-4 hours.  Reports she is lucky to get 3 consecutive hours of sleep.  Does not feel that she has sleep apnea at this time but rather does not sleep well secondary to frequent urination.  REVIEW OF SYSTEMS:  Review of Systems  Cardiovascular:  Positive for leg swelling.  Genitourinary:  Positive for frequency. Negative for bladder incontinence.   Skin:  Positive for rash.   Psychiatric/Behavioral:  Positive for sleep disturbance.      PAST MEDICAL/SURGICAL HISTORY:  Past Medical History:  Diagnosis Date   Arthritis    Asthma    excercise induced   Benign brain tumor (HCC) 2002   Treated at [facility] with gamma knife   Chest discomfort    GERD (gastroesophageal reflux disease)    No current issues   History of bladder cancer    History of carpal tunnel syndrome    Bilateral   Hypertension    Hypothyroidism    Impaired fasting glucose    Irritable bowel syndrome    Osteopenia    Tinnitus    Venous insufficiency    Past Surgical History:  Procedure Laterality Date   ABDOMINAL HYSTERECTOMY     APPENDECTOMY     BREAST BIOPSY Right    CARPAL TUNNEL RELEASE     Bilateral   COLONOSCOPY N/A 09/15/2014   Dr. Jena Gauss: Multiple colonic polyps removed, tubular adenomas.  Next colonoscopy in 5 years   COLONOSCOPY N/A 12/25/2019   five semi-pedunculated polyps in descending colon and IC valve, 3-7 mm in size. Tubular adenomas. 3 year surveillance.    COLONOSCOPY WITH PROPOFOL N/A 03/21/2023   Procedure: COLONOSCOPY WITH PROPOFOL;  Surgeon: Corbin Ade, MD;  Location: [facility];  Service: Endoscopy;  Laterality: N/A;  10:30am, asa 2   CYSTOSCOPY W/ URETERAL STENT PLACEMENT Bilateral 05/05/2018   Procedure: CYSTOSCOPY WITH BILATERAL RETROGRADE PYELOGRAM/ RIGHT URETERAL STENT PLACEMENT;  Surgeon: Malen Gauze, MD;  Location: [facility];  Service: Urology;  Laterality: Bilateral;  HIP PINNING,CANNULATED Right 01/19/2022   Procedure: CLOSED REDUCTION PERCUTANEOUS PINNING RIGHT FEMORAL NECK;  Surgeon: Joen Laura, MD;  Location: [facility];  Service: Orthopedics;  Laterality: Right;   LUMBAR DISC SURGERY     PARTIAL HYSTERECTOMY     POLYPECTOMY  12/25/2019   Procedure: POLYPECTOMY;  Surgeon: Corbin Ade, MD;  Location: [facility];  Service: Endoscopy;;  ileocecal valve polyp; descending x2;   TRANSURETHRAL RESECTION OF BLADDER TUMOR N/A  05/05/2018   Procedure: TRANSURETHRAL RESECTION OF BLADDER TUMOR (TURBT);  Surgeon: Malen Gauze, MD;  Location: [facility];  Service: Urology;  Laterality: N/A;   TRANSURETHRAL RESECTION OF BLADDER TUMOR N/A 06/16/2018   Procedure: TRANSURETHRAL RESECTION OF BLADDER TUMOR (TURBT);  Surgeon: Malen Gauze, MD;  Location: [facility];  Service: Urology;  Laterality: N/A;     SOCIAL HISTORY:  Social History   Socioeconomic History   Marital status: Married    Spouse name: Not on file   Number of children: 2   Years of education: Not on file   Highest education level: Not on file  Occupational History   Occupation: Retail banker    Comment: Laundry, Adult nurse  Tobacco Use   Smoking status: Never   Smokeless tobacco: Never  Vaping Use   Vaping status: Never Used  Substance and Sexual Activity   Alcohol use: Yes    Alcohol/week: 2.0 standard drinks of alcohol    Types: 2 Glasses of wine per week    Comment: occ wine   Drug use: No   Sexual activity: Not Currently    Birth control/protection: Post-menopausal, Surgical  Other Topics Concern   Not on file  Social History Narrative   Exercises regularly   Social Drivers of Health   Financial Resource Strain: Not on file  Food Insecurity: Not on file  Transportation Needs: Not on file  Physical Activity: Not on file  Stress: Not on file  Social Connections: Not on file  Intimate Partner Violence: Not on file    FAMILY HISTORY:  Family History  Problem Relation Age of Onset   Cancer Father        colon cancer; also brother-colon And sister-breast   Heart disease Father    Heart disease Mother        at advanced age   Hypertension Mother    Hyperlipidemia Mother    Osteoporosis Mother    Cancer Brother        liver and colon   Breast cancer Sister    Cancer Sister 31       breast   Breast cancer Sister    Breast cancer Paternal Aunt     CURRENT MEDICATIONS:  Outpatient Encounter Medications as of  09/04/2023  Medication Sig   acetaminophen (TYLENOL) 500 MG tablet Take 500 mg by mouth every 6 (six) hours as needed for moderate pain.   albuterol (VENTOLIN HFA) 108 (90 Base) MCG/ACT inhaler Inhale 2 puffs into the lungs every 4 (four) hours as needed for shortness of breath or wheezing.   COLLAGEN PO Take 1 Scoop by mouth 3 (three) times a week.   GARLIC PO Take 1 tablet by mouth once a week.   hydrochlorothiazide (HYDRODIURIL) 12.5 MG tablet TAKE 1 TABLET BY MOUTH EVERY DAY   hydrocortisone cream 1 % Apply 1 Application topically daily as needed for itching.   levothyroxine (SYNTHROID) 25 MCG tablet Take 25 mcg by mouth daily before breakfast.   lisinopril (ZESTRIL) 5 MG tablet  Take 5 mg by mouth daily.   loratadine (CLARITIN) 10 MG tablet Take 10 mg by mouth daily as needed for allergies.   Melatonin 10 MG TABS Take 10 mg by mouth daily as needed (sleep).   METAMUCIL FIBER PO Take 2 Scoops by mouth 4 (four) times a week.   Multiple Vitamin (MULTIVITAMIN WITH MINERALS) TABS tablet Take 1 tablet by mouth 2 (two) times a week.   [DISCONTINUED] meloxicam (MOBIC) 7.5 MG tablet TAKE 1 TABLET BY MOUTH EVERY DAY AS NEEDED FOR PAIN   [DISCONTINUED] omeprazole (PRILOSEC) 20 MG capsule Take 20 mg by mouth daily.   No facility-administered encounter medications on file as of 09/04/2023.    ALLERGIES:  Allergies  Allergen Reactions   Verapamil Swelling and Other (See Comments)    Swelling of leg     PHYSICAL EXAM:  ECOG Performance status: 1  Vitals:   09/04/23 1126  BP: 122/78  Pulse: 82  Resp: 20  Temp: (!) 97.5 F (36.4 C)  SpO2: 97%    Filed Weights   09/04/23 1126  Weight: 188 lb 11.4 oz (85.6 kg)    Physical Exam Constitutional:      Appearance: Normal appearance.  Cardiovascular:     Rate and Rhythm: Normal rate and regular rhythm.  Pulmonary:     Effort: Pulmonary effort is normal.     Breath sounds: Normal breath sounds.  Abdominal:     General: Bowel sounds are  normal.     Palpations: Abdomen is soft.  Musculoskeletal:        General: No swelling. Normal range of motion.  Neurological:     Mental Status: She is alert and oriented to person, place, and time. Mental status is at baseline.      LABORATORY DATA:  I have reviewed the labs as listed.  CBC    Component Value Date/Time   WBC 6.9 08/07/2023 1030   RBC 5.14 (H) 08/07/2023 1030   HGB 15.6 (H) 08/07/2023 1030   HCT 46.3 (H) 08/07/2023 1030   PLT 196 08/07/2023 1030   MCV 90.1 08/07/2023 1030   MCH 30.4 08/07/2023 1030   MCHC 33.7 08/07/2023 1030   RDW 12.0 08/07/2023 1030   LYMPHSABS 2.5 08/07/2023 1030   MONOABS 0.3 08/07/2023 1030   EOSABS 0.2 08/07/2023 1030   BASOSABS 0.1 08/07/2023 1030      Latest Ref Rng & Units 08/07/2023   10:30 AM 03/14/2023    9:03 AM 07/11/2022    3:10 PM  CMP  Glucose 70 - 99 mg/dL 045  409  811   BUN 8 - 23 mg/dL 19  20  17    Creatinine 0.44 - 1.00 mg/dL 9.14  7.82  9.56   Sodium 135 - 145 mmol/L 136  137  137   Potassium 3.5 - 5.1 mmol/L 3.9  3.6  3.7   Chloride 98 - 111 mmol/L 101  98  103   CO2 22 - 32 mmol/L 26  29  25    Calcium 8.9 - 10.3 mg/dL 9.4  9.2  9.2   Total Protein 6.5 - 8.1 g/dL 7.0   6.7   Total Bilirubin 0.0 - 1.2 mg/dL 0.9   0.5   Alkaline Phos 38 - 126 U/L 60   55   AST 15 - 41 U/L 18   20   ALT 0 - 44 U/L 18   23     DIAGNOSTIC IMAGING:  I have independently reviewed the scans and discussed  with the patient.  ASSESSMENT: Nonmuscle invasive bladder cancer: - CTAP with and without contrast on 04/09/2018 showed 2.8 x 4.1 cm exophytic mass in the posterior right bladder near the UVJ.  No pelvic sidewall lymphadenopathy.  Upper normal to borderline lymph nodes seen in the hepatoduodenal ligament, atypical for bladder cancer in the absence of pelvic lymphadenopathy.  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 - Cystoscopy and transurethral resection on 05/05/2018. 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  Carcinoma invades lamina propria in the level below muscularis mucosa.  LVI +.  Muscularis propria is present and not involved. - She is started on intravesical BCG around January 2020. - She is currently receiving BCG weekly for 3 weeks every 6 months under the direction of Kayla [name].  Cystoscopy on 11/22/2021: No evidence of tumor    Social/family history: - She lives at home with her husband.  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  She is non-smoker.  She also worked on a tobacco farm. - Father had colon cancer.  Brother had colon cancer.  Another brother had lung cancer.  2 sisters had breast cancer.  1 sister had bladder cancer.    PLAN:  1.  High-grade nonmuscle invasive bladder cancer: - She denies any hematuria. - She will proceed with cystoscopy in April 2025 with Kayla [name]. - No need to follow-up with me for this problem.  2.  Erythrocytosis: - She has elevated hemoglobin and hematocrit with elevated RBC count on 08/07/2023.  She reported that his previous labs at his PMD also showed the same. - She has been on HCTZ for 10 years.  She denies history of sleep apnea.  However she feels sleepy during daytime and takes naps. - She is a non-smoker.  Denies aqua genic pruritus or erythromelalgia's.  No prior history of thrombosis. - Erythrocytosis workup was essentially unremarkable.  EPO and JAK2 testing with reflex were normal.  She was referred to have a sleep study but wishes to postpone at this time. -Discussed repeat labs in 6 months but she would like to return to our clinic annually.  She is followed closely by her PCP every 3 months.  If her hemoglobin continues to trend up, she will return to clinic sooner. -Return to clinic in approximately 12 months with labs a few days before and see Kayla [name].    Orders placed this encounter:  Orders Placed This Encounter  Procedures   CBC with  Differential   Comprehensive metabolic panel

## 2023-09-17 DIAGNOSIS — E039 Hypothyroidism, unspecified: Secondary | ICD-10-CM | POA: Diagnosis not present

## 2023-09-17 DIAGNOSIS — R7303 Prediabetes: Secondary | ICD-10-CM | POA: Diagnosis not present

## 2023-09-17 DIAGNOSIS — I10 Essential (primary) hypertension: Secondary | ICD-10-CM | POA: Diagnosis not present

## 2023-09-23 DIAGNOSIS — R7303 Prediabetes: Secondary | ICD-10-CM | POA: Diagnosis not present

## 2023-09-23 DIAGNOSIS — E039 Hypothyroidism, unspecified: Secondary | ICD-10-CM | POA: Diagnosis not present

## 2023-09-23 DIAGNOSIS — B372 Candidiasis of skin and nail: Secondary | ICD-10-CM | POA: Diagnosis not present

## 2023-09-23 DIAGNOSIS — E782 Mixed hyperlipidemia: Secondary | ICD-10-CM | POA: Diagnosis not present

## 2023-09-23 DIAGNOSIS — R062 Wheezing: Secondary | ICD-10-CM | POA: Diagnosis not present

## 2023-09-23 DIAGNOSIS — I10 Essential (primary) hypertension: Secondary | ICD-10-CM | POA: Diagnosis not present

## 2023-09-23 DIAGNOSIS — Z5320 Procedure and treatment not carried out because of patient's decision for unspecified reasons: Secondary | ICD-10-CM | POA: Diagnosis not present

## 2023-09-23 DIAGNOSIS — C679 Malignant neoplasm of bladder, unspecified: Secondary | ICD-10-CM | POA: Diagnosis not present

## 2023-09-23 DIAGNOSIS — M8580 Other specified disorders of bone density and structure, unspecified site: Secondary | ICD-10-CM | POA: Diagnosis not present

## 2023-10-31 ENCOUNTER — Other Ambulatory Visit (HOSPITAL_COMMUNITY): Payer: Self-pay | Admitting: Internal Medicine

## 2023-10-31 DIAGNOSIS — R1010 Upper abdominal pain, unspecified: Secondary | ICD-10-CM | POA: Diagnosis not present

## 2023-10-31 DIAGNOSIS — I10 Essential (primary) hypertension: Secondary | ICD-10-CM | POA: Diagnosis not present

## 2023-10-31 DIAGNOSIS — K219 Gastro-esophageal reflux disease without esophagitis: Secondary | ICD-10-CM | POA: Diagnosis not present

## 2023-10-31 DIAGNOSIS — R42 Dizziness and giddiness: Secondary | ICD-10-CM | POA: Diagnosis not present

## 2023-10-31 DIAGNOSIS — R002 Palpitations: Secondary | ICD-10-CM | POA: Diagnosis not present

## 2023-10-31 DIAGNOSIS — J302 Other seasonal allergic rhinitis: Secondary | ICD-10-CM | POA: Diagnosis not present

## 2023-11-08 ENCOUNTER — Ambulatory Visit (HOSPITAL_COMMUNITY)
Admission: RE | Admit: 2023-11-08 | Discharge: 2023-11-08 | Disposition: A | Discharge status: Home / Self Care | Source: Ambulatory Visit | Attending: Internal Medicine | Admitting: Internal Medicine

## 2023-11-08 DIAGNOSIS — K8020 Calculus of gallbladder without cholecystitis without obstruction: Secondary | ICD-10-CM | POA: Diagnosis not present

## 2023-11-08 DIAGNOSIS — R1010 Upper abdominal pain, unspecified: Secondary | ICD-10-CM | POA: Insufficient documentation

## 2023-11-08 DIAGNOSIS — R1903 Right lower quadrant abdominal swelling, mass and lump: Secondary | ICD-10-CM | POA: Diagnosis not present

## 2023-11-18 ENCOUNTER — Ambulatory Visit: Payer: Medicare HMO | Admitting: Urology

## 2023-11-27 ENCOUNTER — Ambulatory Visit: Payer: Medicare HMO | Admitting: Urology

## 2023-11-27 VITALS — BP 131/81 | HR 85

## 2023-11-27 DIAGNOSIS — C672 Malignant neoplasm of lateral wall of bladder: Secondary | ICD-10-CM | POA: Diagnosis not present

## 2023-11-27 DIAGNOSIS — Z8551 Personal history of malignant neoplasm of bladder: Secondary | ICD-10-CM | POA: Diagnosis not present

## 2023-11-27 LAB — URINALYSIS, ROUTINE W REFLEX MICROSCOPIC
Bilirubin, UA: NEGATIVE
Glucose, UA: NEGATIVE
Ketones, UA: NEGATIVE
Leukocytes,UA: NEGATIVE
Nitrite, UA: NEGATIVE
Protein,UA: NEGATIVE
RBC, UA: NEGATIVE
Specific Gravity, UA: 1.02 (ref 1.005–1.030)
Urobilinogen, Ur: 0.2 mg/dL (ref 0.2–1.0)
pH, UA: 7 (ref 5.0–7.5)

## 2023-11-27 MED ORDER — CIPROFLOXACIN HCL 500 MG PO TABS
500.0000 mg | ORAL_TABLET | Freq: Once | ORAL | Status: AC
Start: 2023-11-27 — End: 2023-11-27
  Administered 2023-11-27: 500 mg via ORAL

## 2023-11-27 NOTE — Progress Notes (Signed)
   11/27/23  CC: followup bladder cancer  HPI: Ms Pienkowski is a 78yo here for followup for bladder cancer Blood pressure 131/81, pulse 85. NED. A&Ox3.   No respiratory distress   Abd soft, NT, ND Normal external genitalia with patent urethral meatus  Cystoscopy Procedure Note  Patient identification was confirmed, informed consent was obtained, and patient was prepped using Betadine  solution.  Lidocaine jelly was administered per urethral meatus.    Procedure: - Flexible cystoscope introduced, without any difficulty.   - Thorough search of the bladder revealed:    normal urethral meatus    normal urothelium    no stones    no ulcers     no tumors    no urethral polyps    no trabeculation  - Ureteral orifices were normal in position and appearance.  Post-Procedure: - Patient tolerated the procedure well  Assessment/ Plan: Followup 1 year for cystoscopy    No follow-ups on file.  Johnie Nailer, MD

## 2023-12-03 ENCOUNTER — Encounter: Payer: Self-pay | Admitting: Urology

## 2023-12-03 NOTE — Patient Instructions (Signed)

## 2024-02-10 NOTE — Progress Notes (Signed)
 CARDIOLOGY CONSULT NOTE       Patient ID: Kayla [name] MRN: [id number] DOB/AGE: 1945-12-02 78 y.o.  Referring Physician: Shona Primary Physician: Shona Norleen PEDLAR, MD Primary Cardiologist: New Reason for Consultation: Palpitations    HPI:  78 y.o. referred by Dr Shona for palpitations. She has no cardiac history. She has invasive bladder cancer, erythrocytosis, GERD HTN, hypothyroidism and LE venous insufficiency. Office visit 11/02/23 only mentions abdominal pain and nausea. She has seen Dr Debbie GI in past   She has had negative JAK2 and erythropoietin  levels in past Most recent Hct 46.3 She is on low dose synthroid replacement with no recent TSH in chart  She uses hydrochlorothiazide  and zetril for her HTN She has insomnia but no history of OSA  She is a non smoker with no excess ETOH or stimulants/caffeine Married 61 years Husband may have dementia. Seems to have post nasal drip which triggers deep breaths at night and then palpitations    ROS All other systems reviewed and negative except as noted above  Past Medical History:  Diagnosis Date   Arthritis    Asthma    excercise induced   Benign brain tumor (HCC) 2002   Treated at [facility] with gamma knife   Chest discomfort    GERD (gastroesophageal reflux disease)    No current issues   History of bladder cancer    History of carpal tunnel syndrome    Bilateral   Hypertension    Hypothyroidism    Impaired fasting glucose    Irritable bowel syndrome    Osteopenia    Tinnitus    Venous insufficiency     Family History  Problem Relation Age of Onset   Cancer Father        colon cancer; also brother-colon And sister-breast   Heart disease Father    Heart disease Mother        at advanced age   Hypertension Mother    Hyperlipidemia Mother    Osteoporosis Mother    Cancer Brother        liver and colon   Breast cancer Sister    Cancer Sister 25       breast   Breast cancer Sister    Breast cancer Paternal  Aunt     Social History   Socioeconomic History   Marital status: Married    Spouse name: Not on file   Number of children: 2   Years of education: Not on file   Highest education level: Not on file  Occupational History   Occupation: Retail banker    Comment: Pharmacologist, Adult nurse  Tobacco Use   Smoking status: Never   Smokeless tobacco: Never  Vaping Use   Vaping status: Never Used  Substance and Sexual Activity   Alcohol  use: Yes    Alcohol /week: 2.0 standard drinks of alcohol     Types: 2 Glasses of wine per week    Comment: occ wine   Drug use: No   Sexual activity: Not Currently    Birth control/protection: Post-menopausal, Surgical  Other Topics Concern   Not on file  Social History Narrative   Exercises regularly   Social Drivers of Health   Financial Resource Strain: Not on file  Food Insecurity: Not on file  Transportation Needs: Not on file  Physical Activity: Not on file  Stress: Not on file  Social Connections: Not on file  Intimate Partner Violence: Not on file    Past Surgical History:  Procedure  Laterality Date   ABDOMINAL HYSTERECTOMY     APPENDECTOMY     BREAST BIOPSY Right    CARPAL TUNNEL RELEASE     Bilateral   COLONOSCOPY N/A 09/15/2014   Dr. Shaaron: Multiple colonic polyps removed, tubular adenomas.  Next colonoscopy in 5 years   COLONOSCOPY N/A 12/25/2019   five semi-pedunculated polyps in descending colon and IC valve, 3-7 mm in size. Tubular adenomas. 3 year surveillance.    COLONOSCOPY WITH PROPOFOL  N/A 03/21/2023   Procedure: COLONOSCOPY WITH PROPOFOL ;  Surgeon: Shaaron Lamar HERO, MD;  Location: [facility];  Service: Endoscopy;  Laterality: N/A;  10:30am, asa 2   CYSTOSCOPY W/ URETERAL STENT PLACEMENT Bilateral 05/05/2018   Procedure: CYSTOSCOPY WITH BILATERAL RETROGRADE PYELOGRAM/ RIGHT URETERAL STENT PLACEMENT;  Surgeon: Sherrilee Belvie CROME, MD;  Location: [facility];  Service: Urology;  Laterality: Bilateral;   HIP PINNING,CANNULATED  Right 01/19/2022   Procedure: CLOSED REDUCTION PERCUTANEOUS PINNING RIGHT FEMORAL NECK;  Surgeon: Edna Toribio LABOR, MD;  Location: [facility];  Service: Orthopedics;  Laterality: Right;   LUMBAR DISC SURGERY     PARTIAL HYSTERECTOMY     POLYPECTOMY  12/25/2019   Procedure: POLYPECTOMY;  Surgeon: Shaaron Lamar HERO, MD;  Location: [facility];  Service: Endoscopy;;  ileocecal valve polyp; descending x2;   TRANSURETHRAL RESECTION OF BLADDER TUMOR N/A 05/05/2018   Procedure: TRANSURETHRAL RESECTION OF BLADDER TUMOR (TURBT);  Surgeon: Sherrilee Belvie CROME, MD;  Location: [facility];  Service: Urology;  Laterality: N/A;   TRANSURETHRAL RESECTION OF BLADDER TUMOR N/A 06/16/2018   Procedure: TRANSURETHRAL RESECTION OF BLADDER TUMOR (TURBT);  Surgeon: Sherrilee Belvie CROME, MD;  Location: [facility];  Service: Urology;  Laterality: N/A;      Current Outpatient Medications:    acetaminophen  (TYLENOL ) 500 MG tablet, Take 500 mg by mouth every 6 (six) hours as needed for moderate pain., Disp: , Rfl:    albuterol  (VENTOLIN  HFA) 108 (90 Base) MCG/ACT inhaler, Inhale 2 puffs into the lungs every 4 (four) hours as needed for shortness of breath or wheezing., Disp: , Rfl:    COLLAGEN PO, Take 1 Scoop by mouth 3 (three) times a week., Disp: , Rfl:    GARLIC PO, Take 1 tablet by mouth once a week., Disp: , Rfl:    hydrochlorothiazide  (HYDRODIURIL ) 12.5 MG tablet, TAKE 1 TABLET BY MOUTH EVERY DAY, Disp: 30 tablet, Rfl: 0   hydrocortisone cream 1 %, Apply 1 Application topically daily as needed for itching., Disp: , Rfl:    levothyroxine (SYNTHROID) 25 MCG tablet, Take 25 mcg by mouth daily before breakfast., Disp: , Rfl:    lisinopril  (ZESTRIL ) 5 MG tablet, Take 5 mg by mouth daily., Disp: , Rfl:    loratadine (CLARITIN) 10 MG tablet, Take 10 mg by mouth daily as needed for allergies., Disp: , Rfl:    Melatonin 10 MG TABS, Take 10 mg by mouth daily as needed (sleep)., Disp: , Rfl:    meloxicam  (MOBIC ) 7.5 MG tablet, Take 7.5  mg by mouth daily., Disp: , Rfl:    METAMUCIL FIBER PO, Take 2 Scoops by mouth 4 (four) times a week., Disp: , Rfl:    Multiple Vitamin (MULTIVITAMIN WITH MINERALS) TABS tablet, Take 1 tablet by mouth 2 (two) times a week., Disp: , Rfl:    nystatin cream (MYCOSTATIN), Apply 1 Application topically daily as needed., Disp: , Rfl:     Physical Exam: Pulse 77, height 5' 7 (1.702 m), weight 189 lb 3.2 oz (85.8 kg), SpO2  94%.      Labs:   Lab Results  Component Value Date   WBC 6.9 08/07/2023   HGB 15.6 (H) 08/07/2023   HCT 46.3 (H) 08/07/2023   MCV 90.1 08/07/2023   PLT 196 08/07/2023   No results for input(s): NA, K, CL, CO2, BUN, CREATININE, CALCIUM, PROT, BILITOT, ALKPHOS, ALT, AST, GLUCOSE in the last 168 hours.  Invalid input(s): LABALBU No results found for: CKTOTAL, CKMB, CKMBINDEX, TROPONINI  Lab Results  Component Value Date   CHOL 190 07/13/2019   CHOL 211 (H) 08/04/2018   CHOL 194 07/26/2017   Lab Results  Component Value Date   HDL 42 07/13/2019   HDL 49 08/04/2018   HDL 50 07/26/2017   Lab Results  Component Value Date   LDLCALC 107 (H) 07/13/2019   LDLCALC 114 (H) 08/04/2018   LDLCALC 100 (H) 07/26/2017   Lab Results  Component Value Date   TRIG 241 (H) 07/13/2019   TRIG 240 (H) 08/04/2018   TRIG 222 (H) 07/26/2017   Lab Results  Component Value Date   CHOLHDL 4.5 (H) 07/13/2019   CHOLHDL 4.3 08/04/2018   CHOLHDL 3.9 07/26/2017   No results found for: LDLDIRECT    Radiology: ECHOCARDIOGRAM COMPLETE Result Date: 02/13/2024    ECHOCARDIOGRAM REPORT   Patient Name:   Kayla [name] Date of Exam: 02/13/2024 Medical Rec #:  [id number]       Height:       66.5 in Accession #:    [id number]      Weight:       188.7 lb Date of Birth:  Apr 26, 1946      BSA:          1.962 m Patient Age:    77 years        BP:           130/78 mmHg Patient Gender: F               HR:           74 bpm. Exam Location:  [facility] Procedure:  2D Echo, Cardiac Doppler and Color Doppler (Both Spectral and Color            Flow Doppler were utilized during procedure). Indications:    Palpitations  History:        Patient has no prior history of Echocardiogram examinations.                 Risk Factors:Hypertension and Prediabetes. Hx of cancer.  Sonographer:    Aida Pizza RCS Referring Phys: [id number] MAUDE JAYSON EMMER IMPRESSIONS  1. Left ventricular ejection fraction, by estimation, is 60 to 65%. The left ventricle has normal function. The left ventricle has no regional wall motion abnormalities. Left ventricular diastolic parameters are consistent with Grade I diastolic dysfunction (impaired relaxation).  2. Right ventricular systolic function is normal. The right ventricular size is normal.  3. The mitral valve is normal in structure. No evidence of mitral valve regurgitation. No evidence of mitral stenosis.  4. The aortic valve was not well visualized. Aortic valve regurgitation is not visualized. No aortic stenosis is present.  5. The inferior vena cava is normal in size with greater than 50% respiratory variability, suggesting right atrial pressure of 3 mmHg. FINDINGS  Left Ventricle: Left ventricular ejection fraction, by estimation, is 60 to 65%. The left ventricle has normal function. The left ventricle has no regional wall motion abnormalities. The left ventricular internal cavity size  was normal in size. There is  no left ventricular hypertrophy. Left ventricular diastolic parameters are consistent with Grade I diastolic dysfunction (impaired relaxation). Normal left ventricular filling pressure. Right Ventricle: The right ventricular size is normal. Right vetricular wall thickness was not well visualized. Right ventricular systolic function is normal. Left Atrium: Left atrial size was normal in size. Right Atrium: Right atrial size was normal in size. Pericardium: There is no evidence of pericardial effusion. Mitral Valve: The mitral valve is normal  in structure. No evidence of mitral valve regurgitation. No evidence of mitral valve stenosis. Tricuspid Valve: The tricuspid valve is normal in structure. Tricuspid valve regurgitation is not demonstrated. No evidence of tricuspid stenosis. Aortic Valve: The aortic valve was not well visualized. Aortic valve regurgitation is not visualized. No aortic stenosis is present. Aortic valve mean gradient measures 3.0 mmHg. Aortic valve peak gradient measures 7.0 mmHg. Aortic valve area, by VTI measures 2.44 cm. Pulmonic Valve: The pulmonic valve was not well visualized. Pulmonic valve regurgitation is not visualized. No evidence of pulmonic stenosis. Aorta: The aortic root is normal in size and structure. Venous: The inferior vena cava is normal in size with greater than 50% respiratory variability, suggesting right atrial pressure of 3 mmHg. IAS/Shunts: No atrial level shunt detected by color flow Doppler.  LEFT VENTRICLE PLAX 2D LVIDd:         4.50 cm   Diastology LVIDs:         2.70 cm   LV e' medial:    6.42 cm/s LV PW:         0.90 cm   LV E/e' medial:  11.3 LV IVS:        0.90 cm   LV e' lateral:   9.46 cm/s LVOT diam:     1.70 cm   LV E/e' lateral: 7.7 LV SV:         57 LV SV Index:   29 LVOT Area:     2.27 cm  RIGHT VENTRICLE RV S prime:     13.90 cm/s TAPSE (M-mode): 2.2 cm LEFT ATRIUM             Index        RIGHT ATRIUM           Index LA diam:        3.90 cm 1.99 cm/m   RA Area:     16.30 cm LA Vol (A2C):   48.3 ml 24.62 ml/m  RA Volume:   43.60 ml  22.22 ml/m LA Vol (A4C):   57.7 ml 29.41 ml/m LA Biplane Vol: 56.4 ml 28.75 ml/m  AORTIC VALVE AV Area (Vmax):    2.16 cm AV Area (Vmean):   2.27 cm AV Area (VTI):     2.44 cm AV Vmax:           132.39 cm/s AV Vmean:          79.468 cm/s AV VTI:            0.232 m AV Peak Grad:      7.0 mmHg AV Mean Grad:      3.0 mmHg LVOT Vmax:         126.00 cm/s LVOT Vmean:        79.500 cm/s LVOT VTI:          0.250 m LVOT/AV VTI ratio: 1.08  AORTA Ao Root diam:  3.50 cm MITRAL VALVE MV Area (PHT): 1.94 cm     SHUNTS MV  Decel Time: 391 msec     Systemic VTI:  0.25 m MV E velocity: 72.50 cm/s   Systemic Diam: 1.70 cm MV A velocity: 126.00 cm/s MV E/A ratio:  0.58 Dorn Ross MD Electronically signed by Dorn Ross MD Signature Date/Time: 02/13/2024/11:45:02 AM    Final     EKG: 02/20/2024 SR low voltage rate 71 normal otherwise    ASSESSMENT AND PLAN:   Palpitations :  benign sounding  and improved last few weeks TTE done 02/13/24 normal EF 60-65% no valve Dx Only had monitor on for 2 days and took it off Will reapply and try to get 30 day data  HTN:  continue current meds check BMET on ACE/diuretic Thyroid :  on synthroid replacement check TSH Erythrocytosis:  f/u hematology w/u for Pvera negative Bladder Cancer some urgency f/u with oncology no active RX  30 day monitor  F/U only PRN pending test results   Signed: Maude Emmer 02/20/2024, 10:02 AM

## 2024-02-11 ENCOUNTER — Telehealth: Payer: Self-pay | Admitting: *Deleted

## 2024-02-11 DIAGNOSIS — R002 Palpitations: Secondary | ICD-10-CM

## 2024-02-11 NOTE — Telephone Encounter (Signed)
 Called to notify pt that Dr. Delford would like her to wear a 30 day event monitor and have an echo done. No answer left msg to call back.

## 2024-02-11 NOTE — Telephone Encounter (Signed)
 Orders placed and routed to scheduling to schedule.

## 2024-02-11 NOTE — Telephone Encounter (Signed)
-----   Message from Maude Emmer sent at 02/10/2024  4:50 PM EDT ----- New patient 7/24 needs echo and 30 day monitor for palpitatiions

## 2024-02-13 ENCOUNTER — Ambulatory Visit: Payer: Self-pay | Admitting: Cardiovascular Disease

## 2024-02-13 ENCOUNTER — Ambulatory Visit (HOSPITAL_COMMUNITY)
Admission: RE | Admit: 2024-02-13 | Discharge: 2024-02-13 | Disposition: A | Discharge status: Home / Self Care | Source: Ambulatory Visit | Attending: Cardiovascular Disease | Admitting: Cardiovascular Disease

## 2024-02-13 DIAGNOSIS — Z809 Family history of malignant neoplasm, unspecified: Secondary | ICD-10-CM | POA: Insufficient documentation

## 2024-02-13 DIAGNOSIS — R002 Palpitations: Secondary | ICD-10-CM | POA: Diagnosis not present

## 2024-02-13 DIAGNOSIS — R7303 Prediabetes: Secondary | ICD-10-CM | POA: Diagnosis not present

## 2024-02-13 DIAGNOSIS — I10 Essential (primary) hypertension: Secondary | ICD-10-CM | POA: Insufficient documentation

## 2024-02-13 LAB — ECHOCARDIOGRAM COMPLETE
AR max vel: 2.16 cm2
AV Area VTI: 2.44 cm2
AV Area mean vel: 2.27 cm2
AV Mean grad: 3 mmHg
AV Peak grad: 7 mmHg
Ao pk vel: 1.32 m/s
Area-P 1/2: 1.94 cm2
S' Lateral: 2.7 cm

## 2024-02-13 NOTE — Progress Notes (Signed)
*  PRELIMINARY RESULTS* Echocardiogram 2D Echocardiogram has been performed.  Kayla [name] 02/13/2024, 10:14 AM

## 2024-02-17 DIAGNOSIS — R002 Palpitations: Secondary | ICD-10-CM | POA: Diagnosis not present

## 2024-02-18 ENCOUNTER — Ambulatory Visit: Attending: Cardiovascular Disease

## 2024-02-20 ENCOUNTER — Ambulatory Visit: Attending: Cardiovascular Disease | Admitting: Cardiovascular Disease

## 2024-02-20 ENCOUNTER — Other Ambulatory Visit (HOSPITAL_COMMUNITY)
Admission: RE | Admit: 2024-02-20 | Discharge: 2024-02-20 | Disposition: A | Discharge status: Home / Self Care | Source: Ambulatory Visit | Attending: Cardiovascular Disease | Admitting: Cardiovascular Disease

## 2024-02-20 ENCOUNTER — Encounter: Payer: Self-pay | Admitting: Cardiovascular Disease

## 2024-02-20 ENCOUNTER — Ambulatory Visit: Payer: Self-pay | Admitting: Cardiovascular Disease

## 2024-02-20 VITALS — BP 130/78 | HR 70 | Ht 67.0 in | Wt 189.2 lb

## 2024-02-20 DIAGNOSIS — R002 Palpitations: Secondary | ICD-10-CM | POA: Insufficient documentation

## 2024-02-20 LAB — BASIC METABOLIC PANEL WITH GFR
Anion gap: 11 (ref 5–15)
BUN: 19 mg/dL (ref 8–23)
CO2: 21 mmol/L — ABNORMAL LOW (ref 22–32)
Calcium: 8.9 mg/dL (ref 8.9–10.3)
Chloride: 105 mmol/L (ref 98–111)
Creatinine, Ser: 0.92 mg/dL (ref 0.44–1.00)
GFR, Estimated: >60 mL/min (ref >60)
Glucose, Bld: 110 mg/dL — ABNORMAL HIGH (ref 70–99)
Potassium: 4.3 mmol/L (ref 3.5–5.1)
Sodium: 137 mmol/L (ref 135–145)

## 2024-02-20 LAB — TSH: TSH: 3.184 u[IU]/mL (ref 0.350–4.500)

## 2024-02-20 NOTE — Patient Instructions (Addendum)
 Medication Instructions:   Your physician recommends that you continue on your current medications as directed. Please refer to the Current Medication list given to you today.   Labwork: BMET,TSH,T4 today  Testing/Procedures: Return [employer] monitor after wearing for 30 days  Follow-Up: Follow up pending monitor results  Any Other Special Instructions Will Be Listed Below (If Applicable).  If you need a refill on your cardiac medications before your next appointment, please call your pharmacy.

## 2024-02-21 LAB — T4: T4, Total: 7.3 ug/dL (ref 4.5–12.0)

## 2024-03-18 DIAGNOSIS — I10 Essential (primary) hypertension: Secondary | ICD-10-CM | POA: Diagnosis not present

## 2024-03-18 DIAGNOSIS — R7303 Prediabetes: Secondary | ICD-10-CM | POA: Diagnosis not present

## 2024-03-18 DIAGNOSIS — E039 Hypothyroidism, unspecified: Secondary | ICD-10-CM | POA: Diagnosis not present

## 2024-03-18 DIAGNOSIS — R002 Palpitations: Secondary | ICD-10-CM | POA: Diagnosis not present

## 2024-03-23 DIAGNOSIS — M8580 Other specified disorders of bone density and structure, unspecified site: Secondary | ICD-10-CM | POA: Diagnosis not present

## 2024-03-23 DIAGNOSIS — E782 Mixed hyperlipidemia: Secondary | ICD-10-CM | POA: Diagnosis not present

## 2024-03-23 DIAGNOSIS — E039 Hypothyroidism, unspecified: Secondary | ICD-10-CM | POA: Diagnosis not present

## 2024-03-23 DIAGNOSIS — R7303 Prediabetes: Secondary | ICD-10-CM | POA: Diagnosis not present

## 2024-03-23 DIAGNOSIS — Z5320 Procedure and treatment not carried out because of patient's decision for unspecified reasons: Secondary | ICD-10-CM | POA: Diagnosis not present

## 2024-03-23 DIAGNOSIS — B372 Candidiasis of skin and nail: Secondary | ICD-10-CM | POA: Diagnosis not present

## 2024-03-23 DIAGNOSIS — I10 Essential (primary) hypertension: Secondary | ICD-10-CM | POA: Diagnosis not present

## 2024-03-23 DIAGNOSIS — R809 Proteinuria, unspecified: Secondary | ICD-10-CM | POA: Diagnosis not present

## 2024-03-23 DIAGNOSIS — Z8551 Personal history of malignant neoplasm of bladder: Secondary | ICD-10-CM | POA: Diagnosis not present

## 2024-04-28 DIAGNOSIS — Z8709 Personal history of other diseases of the respiratory system: Secondary | ICD-10-CM | POA: Diagnosis not present

## 2024-04-28 DIAGNOSIS — J45909 Unspecified asthma, uncomplicated: Secondary | ICD-10-CM | POA: Diagnosis not present

## 2024-06-11 DIAGNOSIS — K219 Gastro-esophageal reflux disease without esophagitis: Secondary | ICD-10-CM | POA: Diagnosis not present

## 2024-06-11 DIAGNOSIS — K8689 Other specified diseases of pancreas: Secondary | ICD-10-CM | POA: Diagnosis not present

## 2024-06-24 ENCOUNTER — Encounter: Payer: Self-pay | Admitting: Gastroenterology

## 2024-08-25 ENCOUNTER — Encounter: Payer: Self-pay | Admitting: Gastroenterology

## 2024-08-25 ENCOUNTER — Encounter: Payer: Self-pay | Admitting: *Deleted

## 2024-08-25 ENCOUNTER — Ambulatory Visit: Admitting: Gastroenterology

## 2024-08-25 VITALS — BP 144/88 | HR 73 | Temp 98.6°F | Ht 66.5 in | Wt 194.0 lb

## 2024-08-25 DIAGNOSIS — R1310 Dysphagia, unspecified: Secondary | ICD-10-CM | POA: Diagnosis not present

## 2024-08-25 DIAGNOSIS — Z800 Family history of malignant neoplasm of digestive organs: Secondary | ICD-10-CM | POA: Diagnosis not present

## 2024-08-25 DIAGNOSIS — D696 Thrombocytopenia, unspecified: Secondary | ICD-10-CM | POA: Diagnosis not present

## 2024-08-25 DIAGNOSIS — R140 Abdominal distension (gaseous): Secondary | ICD-10-CM

## 2024-08-25 DIAGNOSIS — Z860101 Personal history of adenomatous and serrated colon polyps: Secondary | ICD-10-CM

## 2024-08-25 DIAGNOSIS — K760 Fatty (change of) liver, not elsewhere classified: Secondary | ICD-10-CM | POA: Diagnosis not present

## 2024-08-25 DIAGNOSIS — Z8719 Personal history of other diseases of the digestive system: Secondary | ICD-10-CM

## 2024-08-25 DIAGNOSIS — R1013 Epigastric pain: Secondary | ICD-10-CM | POA: Diagnosis not present

## 2024-08-25 MED ORDER — PANTOPRAZOLE SODIUM 40 MG PO TBEC: 40.0000 mg | DELAYED_RELEASE_TABLET | Freq: Two times a day (BID) | ORAL | 3 refills | Status: AC

## 2024-08-25 NOTE — Patient Instructions (Signed)
 Please have fasting labs done at [facility] ([address]nd floor of the [address]).  I have sent in pantoprazole  to take twice a day, 30 minutes before breakfast and dinner.  We are arranging an upper endoscopy and dilation in the near future with Dr. Shaaron!  If this is normal, we will do the gallbladder scan.  We will see you in 6 weeks regardless!   I enjoyed seeing you again today! I value our relationship and want to provide genuine, compassionate, and quality care. You may receive a survey regarding your visit with me, and I welcome your feedback! Thanks so much for taking the time to complete this. I look forward to seeing you again.      Therisa MICAEL Stager, PhD, ANP-BC [facility]

## 2024-08-25 NOTE — H&P (View-Only) (Signed)
 "   Gastroenterology Office Note     Primary Care Physician:  Gladis Lauraine BRAVO, NP  Primary Gastroenterologist: Dr. Shaaron    Chief Complaint   Chief Complaint  Patient presents with   New Patient (Initial Visit)    Pt here for esophageal reflux, pt is having a hard with this. She stated this was advised the Dr when she had her colonoscopy 2 years ago. Pt is also having nausea daily with this full sensation in her abdomen     History of Present Illness   Kayla [name] is an 79 y.o. female presenting today with a history of constipation, bloating, rare GERD, FH colon cancer and personal history of polyps with colonoscopy up-to-date as of 2024, returning today at request of PCP due to worsening dyspepsia. Last seen in Aug 2024.  Today she notes year long solid food dysphagia, regurgitation, epigastric discomfort r/t eating at times, belching, GERD. No improvement with samples of Voquezna. She is not sure about other PPI therapies. Currently not on a PPI. Takes baking soda/water  and pepto daily for last 3 months. Has early satiety, feels full, denies weight loss. No vomiting but nausea is persistent. Feels bloated after eating. No NSAIDs. Denies meloxicam  use currently. At times may have vague RUQ discomfort.   Nov 2025 labs completed with PCP with normal lipase and amylase. Hgb 14, platelets were 110. She has had mild thrombocytopenia noted and has seen Hematology. No splenomegaly. AST 18, ALT 19. Alk Phos 66, Tbili 0.4.   No constipation. Notes black stool when taking pepto but normalized when not taking pepto.   .Celiac serologies negative in the past several years ago   no smoking, no ETOH  Colonoscopy in 2024: 5 mm sigmoid polyp, s/p resection. Tubular adenoma.   US  abdomen April 2025 Gallstones, fatty liver, normal spleen.   May 2021 colonoscopy: five semi-pedunculated polyps in descending colon and IC valve, 3-7 mm in size. Tubular adenomas. 3 year surveillance.    Both  father and brother had colon cancer.    Past Medical History:  Diagnosis Date   Arthritis    Asthma    excercise induced   Benign brain tumor (HCC) 2002   Treated at [facility] with gamma knife   Chest discomfort    GERD (gastroesophageal reflux disease)    No current issues   History of bladder cancer    History of carpal tunnel syndrome    Bilateral   Hypertension    Hypothyroidism    Impaired fasting glucose    Irritable bowel syndrome    Osteopenia    Tinnitus    Venous insufficiency     Past Surgical History:  Procedure Laterality Date   ABDOMINAL HYSTERECTOMY     APPENDECTOMY     BREAST BIOPSY Right    CARPAL TUNNEL RELEASE     Bilateral   COLONOSCOPY N/A 09/15/2014   Dr. Shaaron: Multiple colonic polyps removed, tubular adenomas.  Next colonoscopy in 5 years   COLONOSCOPY N/A 12/25/2019   five semi-pedunculated polyps in descending colon and IC valve, 3-7 mm in size. Tubular adenomas. 3 year surveillance.    COLONOSCOPY WITH PROPOFOL  N/A 03/21/2023   Procedure: COLONOSCOPY WITH PROPOFOL ;  Surgeon: Shaaron Lamar HERO, MD;  Location: [facility];  Service: Endoscopy;  Laterality: N/A;  10:30am, asa 2   CYSTOSCOPY W/ URETERAL STENT PLACEMENT Bilateral 05/05/2018   Procedure: CYSTOSCOPY WITH BILATERAL RETROGRADE PYELOGRAM/ RIGHT URETERAL STENT PLACEMENT;  Surgeon: Sherrilee Belvie CROME, MD;  Location:  [facility];  Service: Urology;  Laterality: Bilateral;   HIP PINNING,CANNULATED Right 01/19/2022   Procedure: CLOSED REDUCTION PERCUTANEOUS PINNING RIGHT FEMORAL NECK;  Surgeon: Edna Toribio LABOR, MD;  Location: [facility];  Service: Orthopedics;  Laterality: Right;   LUMBAR DISC SURGERY     PARTIAL HYSTERECTOMY     POLYPECTOMY  12/25/2019   Procedure: POLYPECTOMY;  Surgeon: Shaaron Lamar HERO, MD;  Location: [facility];  Service: Endoscopy;;  ileocecal valve polyp; descending x2;   TRANSURETHRAL RESECTION OF BLADDER TUMOR N/A 05/05/2018   Procedure: TRANSURETHRAL RESECTION OF BLADDER  TUMOR (TURBT);  Surgeon: Sherrilee Belvie CROME, MD;  Location: [facility];  Service: Urology;  Laterality: N/A;   TRANSURETHRAL RESECTION OF BLADDER TUMOR N/A 06/16/2018   Procedure: TRANSURETHRAL RESECTION OF BLADDER TUMOR (TURBT);  Surgeon: Sherrilee Belvie CROME, MD;  Location: [facility];  Service: Urology;  Laterality: N/A;    Current Outpatient Medications  Medication Sig Dispense Refill   acetaminophen  (TYLENOL ) 500 MG tablet Take 500 mg by mouth every 6 (six) hours as needed for moderate pain.     albuterol  (VENTOLIN  HFA) 108 (90 Base) MCG/ACT inhaler Inhale 2 puffs into the lungs every 4 (four) hours as needed for shortness of breath or wheezing.     COLLAGEN PO Take 1 Scoop by mouth 3 (three) times a week.     GARLIC PO Take 1 tablet by mouth once a week.     hydrochlorothiazide  (HYDRODIURIL ) 12.5 MG tablet TAKE 1 TABLET BY MOUTH EVERY DAY 30 tablet 0   hydrocortisone cream 1 % Apply 1 Application topically daily as needed for itching.     levothyroxine (SYNTHROID) 25 MCG tablet Take 25 mcg by mouth daily before breakfast.     lisinopril  (ZESTRIL ) 5 MG tablet Take 5 mg by mouth daily.     loratadine (CLARITIN) 10 MG tablet Take 10 mg by mouth daily as needed for allergies.     Melatonin 10 MG TABS Take 10 mg by mouth daily as needed (sleep).     meloxicam  (MOBIC ) 7.5 MG tablet Take 7.5 mg by mouth daily.     Multiple Vitamin (MULTIVITAMIN WITH MINERALS) TABS tablet Take 1 tablet by mouth 2 (two) times a week.     nystatin cream (MYCOSTATIN) Apply 1 Application topically daily as needed.     METAMUCIL FIBER PO Take 2 Scoops by mouth 4 (four) times a week. (Patient not taking: Reported on 08/25/2024)     No current facility-administered medications for this visit.    Allergies as of 08/25/2024 - Review Complete 08/25/2024  Allergen Reaction Noted   Verapamil Swelling and Other (See Comments) 07/19/2013    Family History  Problem Relation Age of Onset   Cancer Father        colon cancer;  also brother-colon And sister-breast   Heart disease Father    Heart disease Mother        at advanced age   Hypertension Mother    Hyperlipidemia Mother    Osteoporosis Mother    Cancer Brother        liver and colon   Breast cancer Sister    Cancer Sister 50       breast   Breast cancer Sister    Breast cancer Paternal Aunt     Social History   Socioeconomic History   Marital status: Married    Spouse name: Not on file   Number of children: 2   Years of education: Not on file  Highest education level: Not on file  Occupational History   Occupation: Retail banker    Comment: Laundry, adult nurse  Tobacco Use   Smoking status: Never   Smokeless tobacco: Never  Vaping Use   Vaping status: Never Used  Substance and Sexual Activity   Alcohol  use: Yes    Alcohol /week: 2.0 standard drinks of alcohol     Types: 2 Glasses of wine per week    Comment: occ wine   Drug use: No   Sexual activity: Not Currently    Birth control/protection: Post-menopausal, Surgical  Other Topics Concern   Not on file  Social History Narrative   Exercises regularly   Social Drivers of Health   Tobacco Use: Low Risk (08/25/2024)   Patient History    Smoking Tobacco Use: Never    Smokeless Tobacco Use: Never    Passive Exposure: Not on file  Financial Resource Strain: Not on file  Food Insecurity: Not on file  Transportation Needs: Not on file  Physical Activity: Not on file  Stress: Not on file  Social Connections: Not on file  Intimate Partner Violence: Not on file  Depression (EYV7-0): Not on file  Alcohol  Screen: Not on file  Housing: Not on file  Utilities: Not on file  Health Literacy: Not on file     Review of Systems   Gen: Denies any fever, chills, fatigue, weight loss, lack of appetite.  CV: Denies chest pain, heart palpitations, peripheral edema, syncope.  Resp: Denies shortness of breath at rest or with exertion. Denies wheezing or cough.  GI: see HPI GU :  Denies urinary burning, urinary frequency, urinary hesitancy MS: Denies joint pain, muscle weakness, cramps, or limitation of movement.  Derm: Denies rash, itching, dry skin Psych: Denies depression, anxiety, memory loss, and confusion Heme: Denies bruising, bleeding, and enlarged lymph nodes.   Physical Exam   BP (!) 144/88   Pulse 73   Temp 98.6 F (37 C)   Ht 5' 6.5 (1.689 m)   Wt 194 lb (88 kg)   BMI 30.84 kg/m  General:   Alert and oriented. Pleasant and cooperative. Well-nourished and well-developed.  Head:  Normocephalic and atraumatic. Eyes:  Without icterus Abdomen:  +BS, soft, mild TTP below sternum/epigastric and non-distended. No HSM noted. No guarding or rebound. No masses appreciated.  Rectal:  Deferred  Msk:  Symmetrical without gross deformities. Normal posture. Extremities:  Without edema. Neurologic:  Alert and  oriented x4;  grossly normal neurologically. Skin:  Intact without significant lesions or rashes. Psych:  Alert and cooperative. Normal mood and affect.   Assessment   Kayla [name] is a delightful 79 y.o. female presenting today at the request of her PCP due to worsening dyspepsia and dysphagia over the past year. Additional pertinent GI history includes constipation now resolved, chronic bloating,  diet-managed GERD in past, FH colon cancer and personal history of polyps with up-to-date colonoscopy.   Dyspepsia with regurgitation and solid food dysphagia: suspect uncontrolled GERD contributing, unable to rule out gastritis, PUD, or biliary etiology. Known gallstones in the past but normal LFTs, CMP, amylase/lipase several months ago. No prior EGD and currently no PPI. Will start on BID dosing PPI and arrange EGD/dilation in the near future.   Chronic bloating: celiac serologies negative about 5 years ago. Early satiety and nausea noted. Update celiac serologies, add alpha gal panel and standard labs.   Thrombocytopenia: platelets 110 in Nov 2025,  previously 125 Aug 2025 and normal prior to that.  No splenomegaly on US  last year. Known hepatic steatosis but does not seem to have portal hypertension. Will update CBC with diff now and likely refer to Hematology thereafter. She was seen previously by them in the past for erythrocytosis.  Today she notes year long solid food dysphagia, regurgitation, epigastric discomfort r/t eating at times, belching, GERD. No improvement with samples of Voquezna. She is not sure about other PPI therapies. Currently not on a PPI. Takes baking soda/water  and pepto daily for last 3 months. Has early satiety, feels full, denies weight loss. No vomiting but nausea is persistent. Feels bloated after eating. No NSAIDs. Denies meloxicam  use currently. At times may have vague RUQ discomfort.   FH colon cancer and personal history of polyps: colonoscopy up-to-date as of 2024 with one tubular adenoma. No concerning lower GI signs/symptoms.    PLAN    Start pantoprazole  BID for now Avoid any NSAIDs ongoing as she is doing Proceed with upper endoscopy/dilation by Dr. Shaaron in near future: the risks, benefits, and alternatives have been discussed with the patient in detail. The patient states understanding and desires to proceed.  Celiac serologies updated, alpha gal, CBC, CMP HIDA scan if EGD unrevealing Refer to Hematology if persistent thrombocytopenia Follow-up in 6 weeks    Therisa MICAEL Stager, PhD, ANP-BC [facility]    "

## 2024-08-25 NOTE — Telephone Encounter (Signed)
 PA approved via cohere Authorization #[id number] DOS: 09/02/2024 - 09/30/2024

## 2024-08-25 NOTE — Progress Notes (Signed)
 "   Gastroenterology Office Note     Primary Care Physician:  Gladis Lauraine BRAVO, NP  Primary Gastroenterologist: Dr. Shaaron    Chief Complaint   Chief Complaint  Patient presents with   New Patient (Initial Visit)    Pt here for esophageal reflux, pt is having a hard with this. She stated this was advised the Dr when she had her colonoscopy 2 years ago. Pt is also having nausea daily with this full sensation in her abdomen     History of Present Illness   Kayla [name] is an 79 y.o. female presenting today with a history of constipation, bloating, rare GERD, FH colon cancer and personal history of polyps with colonoscopy up-to-date as of 2024, returning today at request of PCP due to worsening dyspepsia. Last seen in Aug 2024.  Today she notes year long solid food dysphagia, regurgitation, epigastric discomfort r/t eating at times, belching, GERD. No improvement with samples of Voquezna. She is not sure about other PPI therapies. Currently not on a PPI. Takes baking soda/water  and pepto daily for last 3 months. Has early satiety, feels full, denies weight loss. No vomiting but nausea is persistent. Feels bloated after eating. No NSAIDs. Denies meloxicam  use currently. At times may have vague RUQ discomfort.   Nov 2025 labs completed with PCP with normal lipase and amylase. Hgb 14, platelets were 110. She has had mild thrombocytopenia noted and has seen Hematology. No splenomegaly. AST 18, ALT 19. Alk Phos 66, Tbili 0.4.   No constipation. Notes black stool when taking pepto but normalized when not taking pepto.   .Celiac serologies negative in the past several years ago   no smoking, no ETOH  Colonoscopy in 2024: 5 mm sigmoid polyp, s/p resection. Tubular adenoma.   US  abdomen April 2025 Gallstones, fatty liver, normal spleen.   May 2021 colonoscopy: five semi-pedunculated polyps in descending colon and IC valve, 3-7 mm in size. Tubular adenomas. 3 year surveillance.    Both  father and brother had colon cancer.    Past Medical History:  Diagnosis Date   Arthritis    Asthma    excercise induced   Benign brain tumor (HCC) 2002   Treated at [facility] with gamma knife   Chest discomfort    GERD (gastroesophageal reflux disease)    No current issues   History of bladder cancer    History of carpal tunnel syndrome    Bilateral   Hypertension    Hypothyroidism    Impaired fasting glucose    Irritable bowel syndrome    Osteopenia    Tinnitus    Venous insufficiency     Past Surgical History:  Procedure Laterality Date   ABDOMINAL HYSTERECTOMY     APPENDECTOMY     BREAST BIOPSY Right    CARPAL TUNNEL RELEASE     Bilateral   COLONOSCOPY N/A 09/15/2014   Dr. Shaaron: Multiple colonic polyps removed, tubular adenomas.  Next colonoscopy in 5 years   COLONOSCOPY N/A 12/25/2019   five semi-pedunculated polyps in descending colon and IC valve, 3-7 mm in size. Tubular adenomas. 3 year surveillance.    COLONOSCOPY WITH PROPOFOL  N/A 03/21/2023   Procedure: COLONOSCOPY WITH PROPOFOL ;  Surgeon: Shaaron Lamar HERO, MD;  [facility]: [facility];  Service: Endoscopy;  Laterality: N/A;  10:30am, asa 2   CYSTOSCOPY W/ URETERAL STENT PLACEMENT Bilateral 05/05/2018   Procedure: CYSTOSCOPY WITH BILATERAL RETROGRADE PYELOGRAM/ RIGHT URETERAL STENT PLACEMENT;  Surgeon: Sherrilee Belvie CROME, MD;  Location:  [facility];  Service: Urology;  Laterality: Bilateral;   HIP PINNING,CANNULATED Right 01/19/2022   Procedure: CLOSED REDUCTION PERCUTANEOUS PINNING RIGHT FEMORAL NECK;  Surgeon: Edna Toribio LABOR, MD;  Location: [facility];  Service: Orthopedics;  Laterality: Right;   LUMBAR DISC SURGERY     PARTIAL HYSTERECTOMY     POLYPECTOMY  12/25/2019   Procedure: POLYPECTOMY;  Surgeon: Shaaron Lamar HERO, MD;  [facility]: [facility];  Service: Endoscopy;;  ileocecal valve polyp; descending x2;   TRANSURETHRAL RESECTION OF BLADDER TUMOR N/A 05/05/2018   Procedure: TRANSURETHRAL RESECTION OF BLADDER  TUMOR (TURBT);  Surgeon: Sherrilee Belvie CROME, MD;  Location: [facility];  Service: Urology;  Laterality: N/A;   TRANSURETHRAL RESECTION OF BLADDER TUMOR N/A 06/16/2018   Procedure: TRANSURETHRAL RESECTION OF BLADDER TUMOR (TURBT);  Surgeon: Sherrilee Belvie CROME, MD;  Location: [facility];  Service: Urology;  Laterality: N/A;    Current Outpatient Medications  Medication Sig Dispense Refill   acetaminophen  (TYLENOL ) 500 MG tablet Take 500 mg by mouth every 6 (six) hours as needed for moderate pain.     albuterol  (VENTOLIN  HFA) 108 (90 Base) MCG/ACT inhaler Inhale 2 puffs into the lungs every 4 (four) hours as needed for shortness of breath or wheezing.     COLLAGEN PO Take 1 Scoop by mouth 3 (three) times a week.     GARLIC PO Take 1 tablet by mouth once a week.     hydrochlorothiazide  (HYDRODIURIL ) 12.5 MG tablet TAKE 1 TABLET BY MOUTH EVERY DAY 30 tablet 0   hydrocortisone cream 1 % Apply 1 Application topically daily as needed for itching.     levothyroxine (SYNTHROID) 25 MCG tablet Take 25 mcg by mouth daily before breakfast.     lisinopril  (ZESTRIL ) 5 MG tablet Take 5 mg by mouth daily.     loratadine (CLARITIN) 10 MG tablet Take 10 mg by mouth daily as needed for allergies.     Melatonin 10 MG TABS Take 10 mg by mouth daily as needed (sleep).     meloxicam  (MOBIC ) 7.5 MG tablet Take 7.5 mg by mouth daily.     Multiple Vitamin (MULTIVITAMIN WITH MINERALS) TABS tablet Take 1 tablet by mouth 2 (two) times a week.     nystatin cream (MYCOSTATIN) Apply 1 Application topically daily as needed.     METAMUCIL FIBER PO Take 2 Scoops by mouth 4 (four) times a week. (Patient not taking: Reported on 08/25/2024)     No current facility-administered medications for this visit.    Allergies as of 08/25/2024 - Review Complete 08/25/2024  Allergen Reaction Noted   Verapamil Swelling and Other (See Comments) 07/19/2013    Family History  Problem Relation Age of Onset   Cancer Father        colon cancer;  also brother-colon And sister-breast   Heart disease Father    Heart disease Mother        at advanced age   Hypertension Mother    Hyperlipidemia Mother    Osteoporosis Mother    Cancer Brother        liver and colon   Breast cancer Sister    Cancer Sister 50       breast   Breast cancer Sister    Breast cancer Paternal Aunt     Social History   Socioeconomic History   Marital status: Married    Spouse name: Not on file   Number of children: 2   Years of education: Not on file  Highest education level: Not on file  Occupational History   Occupation: Retail banker    Comment: Laundry, adult nurse  Tobacco Use   Smoking status: Never   Smokeless tobacco: Never  Vaping Use   Vaping status: Never Used  Substance and Sexual Activity   Alcohol  use: Yes    Alcohol /week: 2.0 standard drinks of alcohol     Types: 2 Glasses of wine per week    Comment: occ wine   Drug use: No   Sexual activity: Not Currently    Birth control/protection: Post-menopausal, Surgical  Other Topics Concern   Not on file  Social History Narrative   Exercises regularly   Social Drivers of Health   Tobacco Use: Low Risk (08/25/2024)   Patient History    Smoking Tobacco Use: Never    Smokeless Tobacco Use: Never    Passive Exposure: Not on file  Financial Resource Strain: Not on file  Food Insecurity: Not on file  Transportation Needs: Not on file  Physical Activity: Not on file  Stress: Not on file  Social Connections: Not on file  Intimate Partner Violence: Not on file  Depression (EYV7-0): Not on file  Alcohol  Screen: Not on file  Housing: Not on file  Utilities: Not on file  Health Literacy: Not on file     Review of Systems   Gen: Denies any fever, chills, fatigue, weight loss, lack of appetite.  CV: Denies chest pain, heart palpitations, peripheral edema, syncope.  Resp: Denies shortness of breath at rest or with exertion. Denies wheezing or cough.  GI: see HPI GU :  Denies urinary burning, urinary frequency, urinary hesitancy MS: Denies joint pain, muscle weakness, cramps, or limitation of movement.  Derm: Denies rash, itching, dry skin Psych: Denies depression, anxiety, memory loss, and confusion Heme: Denies bruising, bleeding, and enlarged lymph nodes.   Physical Exam   BP (!) 144/88   Pulse 73   Temp 98.6 F (37 C)   Ht 5' 6.5 (1.689 m)   Wt 194 lb (88 kg)   BMI 30.84 kg/m  General:   Alert and oriented. Pleasant and cooperative. Well-nourished and well-developed.  Head:  Normocephalic and atraumatic. Eyes:  Without icterus Abdomen:  +BS, soft, mild TTP below sternum/epigastric and non-distended. No HSM noted. No guarding or rebound. No masses appreciated.  Rectal:  Deferred  Msk:  Symmetrical without gross deformities. Normal posture. Extremities:  Without edema. Neurologic:  Alert and  oriented x4;  grossly normal neurologically. Skin:  Intact without significant lesions or rashes. Psych:  Alert and cooperative. Normal mood and affect.   Assessment   Kayla [name] is a delightful 79 y.o. female presenting today at the request of her PCP due to worsening dyspepsia and dysphagia over the past year. Additional pertinent GI history includes constipation now resolved, chronic bloating,  diet-managed GERD in past, FH colon cancer and personal history of polyps with up-to-date colonoscopy.   Dyspepsia with regurgitation and solid food dysphagia: suspect uncontrolled GERD contributing, unable to rule out gastritis, PUD, or biliary etiology. Known gallstones in the past but normal LFTs, CMP, amylase/lipase several months ago. No prior EGD and currently no PPI. Will start on BID dosing PPI and arrange EGD/dilation in the near future.   Chronic bloating: celiac serologies negative about 5 years ago. Early satiety and nausea noted. Update celiac serologies, add alpha gal panel and standard labs.   Thrombocytopenia: platelets 110 in Nov 2025,  previously 125 Aug 2025 and normal prior to that.  No splenomegaly on US  last year. Known hepatic steatosis but does not seem to have portal hypertension. Will update CBC with diff now and likely refer to Hematology thereafter. She was seen previously by them in the past for erythrocytosis.  Today she notes year long solid food dysphagia, regurgitation, epigastric discomfort r/t eating at times, belching, GERD. No improvement with samples of Voquezna. She is not sure about other PPI therapies. Currently not on a PPI. Takes baking soda/water  and pepto daily for last 3 months. Has early satiety, feels full, denies weight loss. No vomiting but nausea is persistent. Feels bloated after eating. No NSAIDs. Denies meloxicam  use currently. At times may have vague RUQ discomfort.   FH colon cancer and personal history of polyps: colonoscopy up-to-date as of 2024 with one tubular adenoma. No concerning lower GI signs/symptoms.    PLAN    Start pantoprazole  BID for now Avoid any NSAIDs ongoing as she is doing Proceed with upper endoscopy/dilation by Dr. Shaaron in near future: the risks, benefits, and alternatives have been discussed with the patient in detail. The patient states understanding and desires to proceed.  Celiac serologies updated, alpha gal, CBC, CMP HIDA scan if EGD unrevealing Refer to Hematology if persistent thrombocytopenia Follow-up in 6 weeks    Therisa MICAEL Stager, PhD, ANP-BC [facility]    "

## 2024-08-28 ENCOUNTER — Encounter (HOSPITAL_COMMUNITY): Payer: Self-pay

## 2024-08-28 ENCOUNTER — Encounter (HOSPITAL_COMMUNITY)
Admission: RE | Admit: 2024-08-28 | Discharge: 2024-08-28 | Disposition: A | Source: Ambulatory Visit | Attending: Internal Medicine

## 2024-08-28 ENCOUNTER — Other Ambulatory Visit: Payer: Self-pay

## 2024-08-28 HISTORY — DX: Prediabetes: R73.03

## 2024-08-29 LAB — CBC WITH DIFFERENTIAL/PLATELET
Absolute Lymphocytes: 2399 {cells}/uL (ref 850–3900)
Absolute Monocytes: 328 {cells}/uL (ref 200–950)
Basophils Absolute: 60 {cells}/uL (ref 0–200)
Basophils Relative: 0.9 %
Eosinophils Absolute: 147 {cells}/uL (ref 15–500)
Eosinophils Relative: 2.2 %
HCT: 43.6 % (ref 35.9–46.0)
Hemoglobin: 14.4 g/dL (ref 11.7–15.5)
MCH: 29.5 pg (ref 27.0–33.0)
MCHC: 33 g/dL (ref 31.6–35.4)
MCV: 89.3 fL (ref 81.4–101.7)
MPV: 11 fL (ref 7.5–12.5)
Monocytes Relative: 4.9 %
Neutro Abs: 3765 {cells}/uL (ref 1500–7800)
Neutrophils Relative %: 56.2 %
Platelets: 139 10*3/uL — ABNORMAL LOW (ref 140–400)
RBC: 4.88 Million/uL (ref 3.80–5.10)
RDW: 12.4 % (ref 11.0–15.0)
Total Lymphocyte: 35.8 %
WBC: 6.7 10*3/uL (ref 3.8–10.8)

## 2024-08-29 LAB — COMPREHENSIVE METABOLIC PANEL WITH GFR
AG Ratio: 2 (calc) (ref 1.0–2.5)
ALT: 23 U/L (ref 6–29)
AST: 20 U/L (ref 10–35)
Albumin: 4.5 g/dL (ref 3.6–5.1)
Alkaline phosphatase (APISO): 55 U/L (ref 37–153)
BUN: 16 mg/dL (ref 7–25)
CO2: 30 mmol/L (ref 20–32)
Calcium: 9.5 mg/dL (ref 8.6–10.4)
Chloride: 100 mmol/L (ref 98–110)
Creat: 0.88 mg/dL (ref 0.60–0.95)
Globulin: 2.3 g/dL (ref 1.9–3.7)
Glucose, Bld: 103 mg/dL — ABNORMAL HIGH (ref 65–99)
Potassium: 4.4 mmol/L (ref 3.5–5.3)
Sodium: 138 mmol/L (ref 135–146)
Total Bilirubin: 0.7 mg/dL (ref 0.2–1.2)
Total Protein: 6.8 g/dL (ref 6.1–8.1)
eGFR: 66 mL/min/{1.73_m2}

## 2024-08-29 LAB — CELIAC DISEASE PANEL
(tTG) Ab, IgA: <1 U/mL
(tTG) Ab, IgG: <1 U/mL
Deamidated Gliadin Abs, IgG: <1 U/mL
Gliadin IgA: <1 U/mL
Immunoglobulin A: 72 mg/dL (ref 70–320)

## 2024-08-29 LAB — ALPHA-GAL PANEL
Allergen, Mutton, f88: 0.11 kU/L — ABNORMAL HIGH
Allergen, Pork, f26: <0.1 kU/L
Beef: 0.35 kU/L — ABNORMAL HIGH
CLASS: 0
CLASS: 1
GALACTOSE-ALPHA-1,3-GALACTOSE IGE*: 0.74 kU/L — ABNORMAL HIGH

## 2024-08-29 LAB — INTERPRETATION:

## 2024-09-02 ENCOUNTER — Encounter (HOSPITAL_COMMUNITY)
Admission: RE | Disposition: A | Discharge status: Home / Self Care | Payer: Self-pay | Source: Home / Self Care | Attending: Internal Medicine

## 2024-09-02 ENCOUNTER — Encounter (HOSPITAL_COMMUNITY): Payer: Self-pay | Admitting: Internal Medicine

## 2024-09-02 ENCOUNTER — Other Ambulatory Visit: Payer: Medicare HMO

## 2024-09-02 ENCOUNTER — Other Ambulatory Visit: Payer: Self-pay

## 2024-09-02 ENCOUNTER — Ambulatory Visit (HOSPITAL_COMMUNITY)
Admission: RE | Admit: 2024-09-02 | Discharge: 2024-09-02 | Disposition: A | Discharge status: Home / Self Care | Source: Home / Self Care | Attending: Internal Medicine | Admitting: Internal Medicine

## 2024-09-02 ENCOUNTER — Ambulatory Visit (HOSPITAL_COMMUNITY): Admitting: Certified Registered"

## 2024-09-02 MED ORDER — LIDOCAINE HCL (CARDIAC) PF 100 MG/5ML IV SOSY
PREFILLED_SYRINGE | INTRAVENOUS | Status: DC | PRN
  Administered 2024-09-02: 50 mg via INTRAVENOUS

## 2024-09-02 MED ORDER — LACTATED RINGERS IV SOLN: INTRAVENOUS | Status: DC | PRN

## 2024-09-02 MED ORDER — PROPOFOL 10 MG/ML IV BOLUS
INTRAVENOUS | Status: DC | PRN
  Administered 2024-09-02: 175 ug/kg/min via INTRAVENOUS
  Administered 2024-09-02: 100 mg via INTRAVENOUS

## 2024-09-02 NOTE — Op Note (Signed)
 [facility] Patient Name: Kayla [name] Procedure Date: 09/02/2024 9:32 AM MRN: [id number] Date of Birth: [birth year] Attending MD: Lamar Ozell Hollingshead , MD, [id number] CSN: [id number] Age: 79 Admit Type: Outpatient Procedure:                Upper GI endoscopy Indications:              Dysphagia Providers:                Lamar Ozell Hollingshead, MD, Jon LABOR. Gerome RN, RN,                            Madelin Hunter, RN, Bascom Blush Referring MD:              Medicines:                Propofol  per Anesthesia Complications:            No immediate complications. Estimated Blood Loss:     Estimated blood loss was minimal. Procedure:                Pre-Anesthesia Assessment:                           - Prior to the procedure, a History and Physical                            was performed, and patient medications and                            allergies were reviewed. The patient's tolerance of                            previous anesthesia was also reviewed. The risks                            and benefits of the procedure and the sedation                            options and risks were discussed with the patient.                            All questions were answered, and informed consent                            was obtained. Prior Anticoagulants: The patient has                            taken no anticoagulant or antiplatelet agents. ASA                            Grade Assessment: III - A patient with severe                            systemic disease. After reviewing the risks and  benefits, the patient was deemed in satisfactory                            condition to undergo the procedure.                           After obtaining informed consent, the endoscope was                            passed under direct vision. Throughout the                            procedure, the patient's blood pressure, pulse, and                            oxygen  saturations were monitored continuously. The                            [id number] ([id number] was introduced through the                            mouth, and advanced to the second part of duodenum.                            The upper GI endoscopy was accomplished without                            difficulty. The patient tolerated the procedure                            well. Scope In: 10:21:59 AM Scope Out: 10:31:52 AM Total Procedure Duration: 0 hours 9 minutes 53 seconds  Findings:      The examined esophagus was normal.      The entire examined stomach was normal.      The duodenal bulb and second portion of the duodenum were normal. The       scope was withdrawn. Dilation was performed with a Maloney dilator with       mild resistance at 54 Fr. The dilation site was examined following       endoscope reinsertion and showed no change. Estimated blood loss: none.       The scope was withdrawn. Dilation was performed with a Maloney dilator       with mild resistance at 54 Fr. The dilation site was examined following       endoscope reinsertion and showed no change. Estimated blood loss: none.       Finally, biopsies of the mid and distal esophagus were taken and       submitted separately Impression:               - Normal esophagus. Dilated and subsequent biopsy.                           - Normal stomach.                           - Normal duodenal  bulb and second portion of the                            duodenum. Moderate Sedation:      Moderate (conscious) sedation was personally administered by an       anesthesia professional. The following parameters were monitored: oxygen       saturation, heart rate, blood pressure, respiratory rate, EKG, adequacy       of pulmonary ventilation, and response to care. Recommendation:            Procedure Code(s):        --- Professional ---                           [phone], Esophagogastroduodenoscopy, flexible,                             transoral; diagnostic, including collection of                            specimen(s) by brushing or washing, when performed                            (separate procedure)                           43450, Dilation of esophagus, by unguided sound or                            bougie, single or multiple passes Diagnosis Code(s):        --- Professional ---                           R13.10, Dysphagia, unspecified CPT copyright 2022 [employer]. All rights reserved. The codes documented in this report are preliminary and upon coder review may  be revised to meet current compliance requirements. Lamar HERO. Murry Khiev, MD Lamar Ozell Hollingshead, MD 09/02/2024 10:40:42 AM This report has been signed electronically. Number of Addenda: 0

## 2024-09-02 NOTE — Transfer of Care (Signed)
 Immediate Anesthesia Transfer of Care Note  Patient: Kayla [name]  Procedure(s) Performed: EGD (ESOPHAGOGASTRODUODENOSCOPY) DILATION, ESOPHAGUS  Patient Location: Short Stay  Anesthesia Type:MAC  Level of Consciousness: awake, alert , oriented, and patient cooperative  Airway & Oxygen Therapy: Patient Spontanous Breathing  Post-op Assessment: Report given to RN and Post -op Vital signs reviewed and stable  Post vital signs: Reviewed and stable  Last Vitals:  Vitals Value Taken Time  BP 96/62 09/02/24 10:36  Temp 36.6 C 09/02/24 10:36  Pulse 81 09/02/24 10:36  Resp 16 09/02/24 10:36  SpO2 93 % 09/02/24 10:36    Last Pain:  Vitals:   09/02/24 1036  TempSrc: Oral  PainSc: 0-No pain      Patients Stated Pain Goal: 8 (09/02/24 0919)  Complications: No notable events documented.

## 2024-09-02 NOTE — Anesthesia Preprocedure Evaluation (Signed)
"                                    Anesthesia Evaluation  Patient identified by MRN, date of birth, ID band Patient awake    Reviewed: Allergy & Precautions, H&P , NPO status , Patient's Chart, lab work & pertinent test results, reviewed documented beta blocker date and time   Airway Mallampati: II  TM Distance: >3 FB Neck ROM: full    Dental no notable dental hx.    Pulmonary asthma    Pulmonary exam normal breath sounds clear to auscultation       Cardiovascular Exercise Tolerance: Good hypertension,  Rhythm:regular Rate:Normal     Neuro/Psych negative neurological ROS  negative psych ROS   GI/Hepatic Neg liver ROS,GERD  ,,  Endo/Other  Hypothyroidism    Renal/GU negative Renal ROS  negative genitourinary   Musculoskeletal   Abdominal   Peds  Hematology negative hematology ROS (+)   Anesthesia Other Findings   Reproductive/Obstetrics negative OB ROS                              Anesthesia Physical Anesthesia Plan  ASA: 3  Anesthesia Plan: MAC   Post-op Pain Management:    Induction:   PONV Risk Score and Plan: Propofol  infusion  Airway Management Planned:   Additional Equipment:   Intra-op Plan:   Post-operative Plan:   Informed Consent: I have reviewed the patients History and Physical, chart, labs and discussed the procedure including the risks, benefits and alternatives for the proposed anesthesia with the patient or authorized representative who has indicated his/her understanding and acceptance.     Dental Advisory Given  Plan Discussed with: CRNA  Anesthesia Plan Comments:         Anesthesia Quick Evaluation  "

## 2024-09-02 NOTE — Interval H&P Note (Signed)
 History and Physical Interval Note:  09/02/2024 10:12 AM  Kayla [name]  has presented today for surgery, with the diagnosis of dysphagia, dyspepsia.  The various methods of treatment have been discussed with the patient and family. After consideration of risks, benefits and other options for treatment, the patient has consented to  Procedures with comments: EGD (ESOPHAGOGASTRODUODENOSCOPY) (N/A) - 11:00am, asa 2 DILATION, ESOPHAGUS (N/A) as a surgical intervention.  The patient's history has been reviewed, patient examined, no change in status, stable for surgery.  I have reviewed the patient's chart and labs.  Questions were answered to the patient's satisfaction.     Daniqua Campoy   no change.  Agree with need for EGD with esophageal dilation excetra as feasible/appropriate per plan.  The risks, benefits, limitations, alternatives and imponderables have been reviewed with the patient. Potential for esophageal dilation, biopsy, etc. have also been reviewed.  Questions have been answered. All parties agreeable.

## 2024-09-02 NOTE — Discharge Instructions (Addendum)
 EGD Discharge instructions Please read the instructions outlined below and refer to this sheet in the next few weeks. These discharge instructions provide you with general information on caring for yourself after you leave the hospital. Your doctor may also give you specific instructions. While your treatment has been planned according to the most current medical practices available, unavoidable complications occasionally occur. If you have any problems or questions after discharge, please call your doctor. ACTIVITY You may resume your regular activity but move at a slower pace for the next 24 hours.  Take frequent rest periods for the next 24 hours.  Walking will help expel (get rid of) the air and reduce the bloated feeling in your abdomen.  No driving for 24 hours (because of the anesthesia (medicine) used during the test).  You may shower.  Do not sign any important legal documents or operate any machinery for 24 hours (because of the anesthesia used during the test).  NUTRITION Drink plenty of fluids.  You may resume your normal diet.  Begin with a light meal and progress to your normal diet.  Avoid alcoholic beverages for 24 hours or as instructed by your caregiver.  MEDICATIONS You may resume your normal medications unless your caregiver tells you otherwise.  WHAT YOU CAN EXPECT TODAY You may experience abdominal discomfort such as a feeling of fullness or gas pains.  FOLLOW-UP Your doctor will discuss the results of your test with you.  SEEK IMMEDIATE MEDICAL ATTENTION IF ANY OF THE FOLLOWING OCCUR: Excessive nausea (feeling sick to your stomach) and/or vomiting.  Severe abdominal pain and distention (swelling).  Trouble swallowing.  Temperature over 101 F (37.8 C).  Rectal bleeding or vomiting of blood.       Your/esophagus, stomach and duodenum look good.  Your esophagus was stretched and biopsies were taken   further recommendations to follow pending review of pathology  report   office visit with me in 3 months  At patient request, I called Glean Ro at [phone]-rjoo rolled to voicemail and left a message.

## 2024-09-03 ENCOUNTER — Encounter (HOSPITAL_COMMUNITY): Payer: Self-pay | Admitting: Internal Medicine

## 2024-09-03 ENCOUNTER — Ambulatory Visit: Payer: Self-pay | Admitting: Internal Medicine

## 2024-09-03 LAB — SURGICAL PATHOLOGY

## 2024-09-03 NOTE — Anesthesia Postprocedure Evaluation (Signed)
"   Anesthesia Post Note  Patient: Kayla [name]  Procedure(s) Performed: EGD (ESOPHAGOGASTRODUODENOSCOPY) DILATION, ESOPHAGUS  Patient location during evaluation: Phase II Anesthesia Type: MAC Level of consciousness: awake Pain management: pain level controlled Vital Signs Assessment: post-procedure vital signs reviewed and stable Respiratory status: spontaneous breathing and respiratory function stable Cardiovascular status: blood pressure returned to baseline and stable Postop Assessment: no headache and no apparent nausea or vomiting Anesthetic complications: no Comments: Late entry   No notable events documented.   Last Vitals:  Vitals:   09/02/24 0919 09/02/24 1036  BP: (!) 141/85 96/62  Pulse: 76 81  Resp: 18 16  Temp: 36.6 C 36.6 C  SpO2: 98% 93%    Last Pain:  Vitals:   09/03/24 1320  TempSrc:   PainSc: 0-No pain                 [name]      "

## 2024-09-09 ENCOUNTER — Ambulatory Visit: Payer: Medicare HMO | Admitting: Oncology

## 2024-10-22 ENCOUNTER — Ambulatory Visit: Admitting: Gastroenterology

## 2024-11-27 ENCOUNTER — Ambulatory Visit: Admitting: Urology

## 2024-12-02 ENCOUNTER — Other Ambulatory Visit: Admitting: Urology
